# Patient Record
Sex: Male | Born: 1970 | Race: White | Hispanic: No | Marital: Single | Smoking: Current every day smoker
Health system: Southern US, Academic
[De-identification: ages and names within clinical notes are randomized; demographics above are authoritative.]

## PROBLEM LIST (undated history)

## (undated) ENCOUNTER — Encounter (HOSPITAL_COMMUNITY): Payer: Self-pay

## (undated) ENCOUNTER — Ambulatory Visit (HOSPITAL_COMMUNITY): Payer: Self-pay | Admitting: Gastroenterology

## (undated) ENCOUNTER — Ambulatory Visit (HOSPITAL_COMMUNITY): Payer: MEDICAID

## (undated) DIAGNOSIS — I509 Heart failure, unspecified: Secondary | ICD-10-CM

## (undated) DIAGNOSIS — E119 Type 2 diabetes mellitus without complications: Secondary | ICD-10-CM

## (undated) DIAGNOSIS — K5792 Diverticulitis of intestine, part unspecified, without perforation or abscess without bleeding: Secondary | ICD-10-CM

## (undated) DIAGNOSIS — M25559 Pain in unspecified hip: Secondary | ICD-10-CM

## (undated) DIAGNOSIS — M1990 Unspecified osteoarthritis, unspecified site: Secondary | ICD-10-CM

## (undated) DIAGNOSIS — Z720 Tobacco use: Secondary | ICD-10-CM

## (undated) DIAGNOSIS — I10 Essential (primary) hypertension: Secondary | ICD-10-CM

## (undated) DIAGNOSIS — K219 Gastro-esophageal reflux disease without esophagitis: Secondary | ICD-10-CM

## (undated) DIAGNOSIS — Z9989 Dependence on other enabling machines and devices: Secondary | ICD-10-CM

## (undated) DIAGNOSIS — K5730 Diverticulosis of large intestine without perforation or abscess without bleeding: Secondary | ICD-10-CM

## (undated) DIAGNOSIS — R7989 Other specified abnormal findings of blood chemistry: Secondary | ICD-10-CM

## (undated) DIAGNOSIS — F1320 Sedative, hypnotic or anxiolytic dependence, uncomplicated: Secondary | ICD-10-CM

## (undated) DIAGNOSIS — R569 Unspecified convulsions: Secondary | ICD-10-CM

## (undated) DIAGNOSIS — F419 Anxiety disorder, unspecified: Secondary | ICD-10-CM

## (undated) DIAGNOSIS — F32A Depression, unspecified: Secondary | ICD-10-CM

## (undated) DIAGNOSIS — G4730 Sleep apnea, unspecified: Secondary | ICD-10-CM

## (undated) DIAGNOSIS — F1520 Other stimulant dependence, uncomplicated: Secondary | ICD-10-CM

## (undated) DIAGNOSIS — F1020 Alcohol dependence, uncomplicated: Secondary | ICD-10-CM

## (undated) HISTORY — DX: Type 2 diabetes mellitus without complications: E11.9

## (undated) HISTORY — DX: Heart failure, unspecified: I50.9

## (undated) HISTORY — PX: COLECTOMY: SHX59

## (undated) HISTORY — DX: Unspecified osteoarthritis, unspecified site: M19.90

## (undated) HISTORY — DX: Essential (primary) hypertension: I10

## (undated) HISTORY — DX: Tobacco use: Z72.0

## (undated) HISTORY — DX: Other specified abnormal findings of blood chemistry: R79.89

## (undated) HISTORY — PX: COLECTOMY PARTIAL / TOTAL: SUR275

## (undated) HISTORY — DX: Diverticulosis of large intestine without perforation or abscess without bleeding: K57.30

## (undated) HISTORY — DX: Pain in unspecified hip: M25.559

## (undated) SURGERY — IR ARTHROGRAM - HIP LEFT
Laterality: Left

## (undated) SURGERY — COLONOSCOPY
Anesthesia: Monitor Anesthesia Care | Site: Anus

---
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
## 2006-03-12 ENCOUNTER — Encounter (INDEPENDENT_AMBULATORY_CARE_PROVIDER_SITE_OTHER): Payer: Self-pay | Admitting: *Deleted

## 2006-03-12 ENCOUNTER — Inpatient Hospital Stay (HOSPITAL_COMMUNITY): Admission: EM | Admit: 2006-03-12 | Discharge: 2006-03-26 | Payer: Self-pay | Admitting: *Deleted

## 2006-06-05 ENCOUNTER — Ambulatory Visit (HOSPITAL_COMMUNITY): Payer: Self-pay | Admitting: *Deleted

## 2006-06-27 ENCOUNTER — Encounter: Admission: RE | Admit: 2006-06-27 | Discharge: 2006-06-27 | Payer: Self-pay | Admitting: General Surgery

## 2006-07-28 ENCOUNTER — Inpatient Hospital Stay (HOSPITAL_COMMUNITY): Admission: RE | Admit: 2006-07-28 | Discharge: 2006-08-03 | Payer: Self-pay | Admitting: General Surgery

## 2006-07-28 ENCOUNTER — Encounter (INDEPENDENT_AMBULATORY_CARE_PROVIDER_SITE_OTHER): Payer: Self-pay | Admitting: Specialist

## 2007-01-13 ENCOUNTER — Ambulatory Visit (HOSPITAL_COMMUNITY): Payer: Self-pay | Admitting: *Deleted

## 2007-04-25 ENCOUNTER — Inpatient Hospital Stay (HOSPITAL_COMMUNITY): Admission: RE | Admit: 2007-04-25 | Discharge: 2007-04-29 | Payer: Self-pay | Admitting: General Surgery

## 2009-10-18 ENCOUNTER — Encounter (INDEPENDENT_AMBULATORY_CARE_PROVIDER_SITE_OTHER): Payer: Self-pay | Admitting: Family Medicine

## 2009-10-18 NOTE — Progress Notes (Signed)
error 

## 2011-03-19 NOTE — Op Note (Signed)
[name], [name]               ACCOUNT NO.:  [id number]   MEDICAL RECORD NO.:  [id number]          PATIENT TYPE:  OIB   LOCATION:  2550                         FACILITY:  [facility]   PHYSICIAN:  Ollen Gross. Vernell Morgans, M.D. DATE OF BIRTH:  1970-12-15   DATE OF PROCEDURE:  04/24/2007  DATE OF DISCHARGE:                               OPERATIVE REPORT   PREOPERATIVE DIAGNOSIS:  Ventral incisional hernia.   POSTOPERATIVE DIAGNOSIS:  Ventral incisional hernia.   PROCEDURE:  Laparoscopic ventral hernia repair with mesh and lysis of  adhesions.   SURGEON:  Ollen Gross. Vernell Morgans, M.D.   ASSISTANT:  Rose Phi. Maple Hudson, M.D.   ANESTHESIA:  General endotracheal.   PROCEDURE:  After informed consent was obtained, the patient was brought  to the operating room and placed in the supine position on the operating  room table.  After adequate induction of general anesthesia, the  patient's abdomen was prepped with Betadine and draped in the usual  sterile manner including an Ioban drape.  A site was then chosen in the  right upper quadrant for the Hasson port to access the abdominal cavity.  This was the area farthest away from his previous surgery sites.  The  area in the right upper quadrant was infiltrated with 0.25% Marcaine  with epinephrine.  A small incision was made with a 15 blade knife.  This incision was carried down through the subcutaneous tissue bluntly  with a Kelly clamp and [employer] retractors until the fascia of the  abdominal wall was encountered.  The fascia of the abdominal wall was  incised with a 15 blade knife.  The muscle layers were separated bluntly  with a Kelly clamp and [employer] retractors.  The posterior fascia was  then opened sharply with a 15 blade knife and each side was grasped with  Kocher clamps.  The peritoneum was identified and was grasped with Kelly  clamps and opened with Metzenbaum scissors.  This gave [place] access to the  abdominal cavity.  A 0 Vicryl pursestring  stitch was placed in the  fascia around this opening.  A Hasson cannula was placed through this  opening and anchored in place with the previously-placed Vicryl  pursestring stitch.  The abdomen was then insufflated with carbon  dioxide without difficulty.  A laparoscope was inserted through the  Hasson cannula and the abdominal wall was inspected.  The patient had a  lot of adhesions to the abdominal wall along the uppermost portion of  the abdominal wall and the adhesions appeared to all be omentum.  Down  in the pelvis there was some small bowel adhesion to the abdominal wall  but it had a very filmy, thin connection that was easily taken down  sharply with laparoscopic scissors.  The fatty adhesions to the  abdominal wall were taken down by a combination of blunt dissection and  some sharp dissection with the harmonic scalpel.  Once this was  accomplished, we were also able to inspect the old ostomy site and this  too had a hernia with some omental adhesions and, again, these were  taken down by a combination of blunt dissection and some sharp  dissection with the harmonic scalpel.  Once this was accomplished, we at  this point had completely freed the anterior abdominal wall of  adhesions.  The patient had a small hernia at the upper edge of his old  midline incision.  He also had a hernia at his old ostomy site and  almost the full length of his old incision had basically become one  large hernia.  Now that the abdominal wall was free, we were able to  estimate the size of the potential mesh that would cover all of these  defects.  The size of mesh that was chosen was 26 x 33 cm of Proceed  mesh.  The outline of the hernia defects was made using a spinal needle  and marker on the abdominal wall.  The mesh was then cut to fit to  overlap the edges of the defects by at least 3 cm except down in the  pelvis, where we could not overlap the mesh as far because of the pubic  bone.  The  mesh was then oriented with letters in four quadrants.  Novofil #1 stitches were placed in eight equidistant areas around the  edge of the mesh.  The mesh was oriented with the blue side toward the  abdominal wall.  Once the Novofil stitches were in place, the mesh was  then rolled like a cigar, placed through the Hasson cannula into the  abdominal cavity.  To do the lysis of adhesions, another port had been  placed at the beginning of the case in the right lower quadrant.  This  was a 5 mm port.  The area was infiltrated 0.25% Marcaine and a small  stab incision was made with a 15 blade knife and a 5 mm port had been  placed through the abdominal wall under direct vision.  A site was then  chosen also for another 5 mm port in the left upper quadrant.  This area  was also infiltrated with 0.25% Marcaine.  A small stab incision was  made with a 15 blade knife and a 5 mm port was placed bluntly through  this incision into the abdominal cavity under direct vision.  The mesh  was then unrolled and reoriented.  Small stab incisions were made in  eight areas that corresponded to the Novofil stitches.  A suture passer  was then used to bring each of the tails of the appropriate Novofil  stitch through the stab incision of the abdominal wall.  Once all eight  Novofil stitches were brought through the abdominal wall, the mesh was  pulled up and nicely approximated the anterior abdominal wall.  Each of  the Novofil stitches was then tied down without difficulty and there was  still good approximation of the mesh to the anterior abdominal wall.  Using a Pro Tacker, tacks were placed around of the edge of the mesh in  two layers so that there were no gaps between the stitches.  Again, once  this was accomplished the mesh laid in good apposition to the abdominal  wall.  All of the incisions and stacks appeared to be hemostatic.  At  this point we were removed the ports under direct vision and all  found  to be hemostatic.  The gas was allowed to escape and we watched the mesh  continue to appose the abdominal wall nicely.  The Hasson cannula was  then removed  and the fascial defect at this site was closed with the  previously-placed Vicryl pursestring stitch.  All of the skin incisions  were then closed with staples and sterile dressings were applied along  with an abdominal binder and a Kerlix fluff to midline of the abdomen.  The patient tolerated the procedure well.  At the end of the case all  needle, sponge and instrument counts were correct.  The patient was then  awakened and taken to recovery in stable condition.      Ollen Gross. Vernell Morgans, M.D.  Electronically Signed     PST/MEDQ  D:  04/24/2007  T:  04/25/2007  Job:  [id number]

## 2011-03-22 NOTE — Op Note (Signed)
[name], [name]               ACCOUNT NO.:  [id number]   MEDICAL RECORD NO.:  [id number]          PATIENT TYPE:  INP   LOCATION:  5702                         FACILITY:  [facility]   PHYSICIAN:  Ollen Gross. Vernell Morgans, M.D. DATE OF BIRTH:  Mar 18, 1971   DATE OF PROCEDURE:  03/12/2006  DATE OF DISCHARGE:                                 OPERATIVE REPORT   PREOPERATIVE DIAGNOSIS:  Perforated sigmoid diverticulitis.   POSTOPERATIVE DIAGNOSIS:  Perforated sigmoid diverticulitis.   PROCEDURES:  1.  Exploratory laparotomy.  2.  Sigmoid colectomy.  3.  Descending end colostomy with Hartmann pouch.   SURGEON:  Ollen Gross. Carolynne Edouard, M.D.   ASSISTANT:  Cherylynn Ridges, M.D.   ANESTHESIA:  General endotracheal.   PROCEDURE:  After informed consent was obtained, the patient was brought to  the operating room and placed in the supine position on the operating room  table.  After adequate induction of general anesthesia, the patient's  abdomen was prepped with Betadine and draped in a usual sterile manner.  A  lower midline incision was made with 10 blade knife.  This incision was  carried down through the skin and subcutaneous tissue sharply with  electrocautery until the linea alba was identified.  The linea alba was  incised with electrocautery.  The preperitoneal space was probed bluntly  with a hemostat until the peritoneum was opened and access was gained in the  abdominal cavity.  The rest incision was then opened under direct vision.  A  Balfour retractor was deployed.  The patient had a lot of purulent material  within the abdomen.  This was cultured and aspirated.  Next, the abdomen was  explored and there was an obvious inflamed segment of sigmoid colon.  The  sigmoid colon and descending colon were mobilized by incising the  retroperitoneal attachment along the white line of Toldt.  A site was chosen  below the area of inflammation on the sigmoid colon where there were no  diverticula and the  bowel appeared to be healthy.  The mesentery at this  point was opened sharply with electrocautery.  A GIA-75 staple was placed  across the bowel at this point, clamped and fired thereby dividing the colon  between staple lines.  The mesentery to the involved sigmoid colon was then  taken down by serially clamping with Kelly clamps, dividing and ligating  with 2-0 silk ties the vessels within the mesentery.  This dissection was  carried from distal to proximal.  Once this was accomplished, a site of  normal-appearing colon proximal to the involved segment was also chosen.  The mesentery at this point was opened sharply with the electrocautery and a  GIA-75 stapler was placed across the bowel at this point, clamped and fired,  thereby dividing the bowel between staple lines.  The rest of the mesentery  up to this point was then dissected again with a Kelly clamps by clamping  the vessels, dividing and ligating with 2-0 silk ties.  Once this was  accomplished, the Z segment was removed and sent to pathology for further  evaluation.  All four quadrants of the abdomen were irrigated copious  amounts of saline until the effluent was clear and no more pus was  identified.  The distal Hartmann pouch was marked with 2-0 Prolene stitches  at the edge of staple line.  Next, a site was chosen for creation of the  descending colostomy.  This area on the skin was grasped with Kelly clamp  and circular piece of skin was excised sharply with 10 blade knife.  A core  of fatty tissue was also excised sharply with the electrocautery.  The  fascia of the anterior abdominal wall was then opened in a cruciate manner  through both the anterior and posterior layers until three fingers could  easily be inserted through the opening.  A Babcock was inserted through the  opening and used to grasp the staple line of the descending colon.  Care was  taken to make sure the colon was not twisted.  This was then brought  up  through the abdominal wall without much difficulty.  The fascia of the  anterior abdominal wall was then closed with two running #1 PDS sutures.  The subcutaneous tissue was irrigated with copious amounts of saline and  Betadine and the skin was closed with staples.  A sterile green towel was  then placed over the wound.  The ostomy was then opened by excising the  staple line with the electrocautery and the ostomy was then matured to the  skin with interrupted 3-0 Vicryl stitches.  An ostomy appliance was then  applied.  The patient tolerated the procedure well.  At the end of the case,  all sponge, needle and instrument counts were correct.  The patient was then  awakened and taken to the recovery room in stable condition.      Ollen Gross. Vernell Morgans, M.D.  Electronically Signed     PST/MEDQ  D:  03/16/2006  T:  03/17/2006  Job:  [id number]

## 2011-03-22 NOTE — Op Note (Signed)
NAMEDILLIN, LOFGREN NO.:  [id number]   MEDICAL RECORD NO.:  [id number]          PATIENT TYPE:  INP   LOCATION:  5707                         FACILITY:  [facility]   PHYSICIAN:  Ollen Gross. Vernell Morgans, M.D. DATE OF BIRTH:  13-Feb-1971   DATE OF PROCEDURE:  07/28/2006  DATE OF DISCHARGE:  08/03/2006                                 OPERATIVE REPORT   PREOPERATIVE DIAGNOSIS:  Previous diverticulitis with perforation and  colostomy.   POSTOPERATIVE DIAGNOSIS:  Previous diverticulitis with perforation and  colostomy.   PROCEDURE:  Colostomy closure.   SURGEON:  Dr. Carolynne Edouard.   ASSISTANT:  Dr. Luisa Hart.   ANESTHESIA:  General endotracheal.   PROCEDURE:  After informed consent was obtained, the patient was brought to  the operating room and placed in supine position on the operating table.  After adequate induction of general anesthesia, the patient's abdomen was  prepped with Betadine and draped in usual sterile manner.  A lower midline  incision was made with a 10 blade knife.  The old scar was ellipsed out  sharply with the 10 blade knife and the electrocautery.  This dissection was  then carried through the subcutaneous tissue sharply with electrocautery  until the linea alba was identified.  Linea alba was incised with the  electrocautery.  The preperitoneal space was then probed bluntly with a  hemostat until the peritoneum was identified, and the peritoneum was opened  with Metzenbaum scissors, and the rest of the incision was opened under  direct vision.  There were some small bowel adhesions which were taken down  by some blunt finger dissection and some sharp dissection with the  Metzenbaum scissors.  Once this was accomplished, the small bowel was  completely free and packed into the upper abdomen.  The pelvis was  inspected, and the rectosigmoid stump was identified.  It was very mobile  and clean.  The patient had previously undergone a barium enema through the  rectum and the ostomy, and no other diverticular disease was identified.  At  this point, the ostomy was mobilized up through the abdominal wall by  combination of blunt finger dissection and some sharp dissection with the  electrocautery.  Once this dissection was carried up almost to the skin,  this ostomy at the level of skin was then excised in an elliptical fashion.  The dissection was carried into the subcutaneous tissue sharply with the  electrocautery, until the dissection met the dissection from below.  Once  this was accomplished, the ostomy was brought into the abdomen.  The distal  several centimeters of colon was then excised sharply with an Allen clamp  and Kocher clamp, and the mesentery to this section was taken down by  serially clamping the vessels with hemostats and dividing and ligating them  with 2-0 silk ties.  The staple line on the distal colon segment was also  grasped with Satinsky clamp and excised sharply with a 10 blade knife.  The  colon edges both proximally and distally were very healthy with good blood  supply.  An anastomosis was then created  using interrupted 3-0 silk full-  thickness inner stitches.  The posterior wall was created first, followed by  the anterior wall, keeping the knots buried on the inside of the anastomosis  except for the last several stitches which were done in a simple manner with  the knots on the outside.  Once this was accomplished, the anastomosis was  reinforced in places with several interrupted 3-0 silk stitches until the  anastomosis looked good and healthy and watertight.  There was no tension at  all once this was accomplished.  The abdomen was then irrigated with copious  amounts of saline.  All of the packs were removed.  The fascia of the  anterior abdominal wall was then closed with two running #1 PDS sutures as  well as with multiple interrupted #1 Novofils as sort of internal retention  sutures.  The subcutaneous  tissue was then irrigated with copious amounts of  saline and Betadine.  Some subcutaneous skin flaps were created in order to  get the fascia closed cleanly.  The skin was then closed with a combination  of staples and interrupted 3-0 nylon vertical mattress stitches to take some  of the tension off the skin.  The old ostomy defect was closed with a deep  layer on the inside of #1 Novofils and a more shallow layer of the anterior  abdominal wall fascia of interrupted #1 Novofils.  The wound was irrigated  with copious amounts of saline and Betadine, and that skin was closed with  staples.  Sterile dressings were then applied.  The patient tolerated the  procedure well.  At the case, all needle, sponge and instrument counts were  correct.  The patient was then awakened and taken to the recovery in stable  condition.      Ollen Gross. Vernell Morgans, M.D.  Electronically Signed     PST/MEDQ  D:  08/04/2006  T:  08/04/2006  Job:  [id number]

## 2011-03-22 NOTE — Discharge Summary (Signed)
NAMESALAHUDDIN, ARISMENDEZ NO.:  [id number]   MEDICAL RECORD NO.:  [id number]          PATIENT TYPE:  INP   LOCATION:  5707                         FACILITY:  [facility]   PHYSICIAN:  Ollen Gross. Vernell Morgans, M.D. DATE OF BIRTH:  10/22/1971   DATE OF ADMISSION:  07/28/2006  DATE OF DISCHARGE:  08/03/2006                               DISCHARGE SUMMARY   Mr. Proia is a 40 year old white male who was brought to the operating  room on September 24 for a colostomy taken down.  He previously had a  colostomy for perforated sigmoid diverticulitis.  His operation went  well.  On postop day 1, he had some signs of some hypovolemia, which was  corrected with volume.  He continued to have an NG and bowel rest.  On  postop day 3, his NG tube was discontinued, as well as his Foley.  He  was given just ice chips.  His bowel function gradually improved and his  diet was slowly advanced.  He was noted to have a little bit of redness  at the lower portion of his incision, but this remained stable and  improved by the time of discharge and by 9:30 he was tolerating a diet.  His pain was under good control and he was ready for discharge home.   MEDICATIONS:  He was written for some pain medicine and he was given  pain medicine for discharge.  Percocet.   DIET:  As tolerated.   ACTIVITY:  No heavy lifting.   CONDITION:  Stable.   FOLLOWUP:  Appointment with Dr. Carolynne Edouard in 2 weeks.      Ollen Gross. Vernell Morgans, M.D.  Electronically Signed     PST/MEDQ  D:  10/29/2006  T:  10/30/2006  Job:  [id number]

## 2011-03-22 NOTE — Discharge Summary (Signed)
[name], [name]               ACCOUNT NO.:  [id number]   MEDICAL RECORD NO.:  [id number]          PATIENT TYPE:  INP   LOCATION:  5702                         FACILITY:  [facility]   PHYSICIAN:  Revonda Standard L. Rennis Harding, N.P. DATE OF BIRTH:  Jun 20, 1971   DATE OF ADMISSION:  03/12/2006  DATE OF DISCHARGE:  03/26/2006                                 DISCHARGE SUMMARY   DISCHARGING PHYSICIAN:  Dr. Sheryle Spray.   CHIEF COMPLAINT/REASON FOR ADMISSION:  [name] is a 40 year old male  patient who presented to the ER with a two-day history of significant right  lower quadrant pain.  Initially he was felt to have an acute appendicitis  and a possible abscess formation.  He had a white count of 13,400 and a  temperature of 101.1.  Subsequent CT scan reviewed by Dr. Carolynne Edouard and the  radiologist revealed that the patient actually had acute diverticulitis with  free-air perforation and abscess formation.  Because of these findings, the  patient was taken urgently to the OR for surgical repair.   On the day of admission, the patient was taken to the OR where he underwent  exploratory laparotomy for perforated sigmoid diverticulitis.  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  The patient was subsequently admitted to the general surgical floor in  stable condition.   ADMITTING DIAGNOSES:  1.  Abdominal pain, secondary to perforated sigmoid diverticulitis.  2.  Exploratory laparotomy with sigmoid colectomy and end colostomy.  3.  Leukocytosis.  4.  Fever.  5.  History of anxiety disorder.  6.  History of tobacco abuse.  7.  Daily alcohol use.   HOSPITAL COURSE:  The patient was admitted to the general medical floor in  stable condition.  He had been placed on Unasyn preoperatively and this was  continued postoperatively.  The patient continued with problems related to  mild leukocytosis and postoperative ileus.  The abdomen was tight, the stoma  was functioning,  having serosanguineous drainage.  The patient, within the  first few days of surgery, did lose adequate IV access and a PICC line was  placed.  Postop day 3, the patient continued with increasing leukocytosis  and fever, white count 15,900, temperature 101.3.  Abdomen remained  distended, and the incision had erythematous appearance with edema.  A  bedside I&D with culture was done per Dr. Lurene Shadow and wound packing was  initiated.  By postop day 4, the patient had diminished abdominal pain, his  white count had decreased to 12,900.  He was complaining of hunger and there  was flatus noted in the ostomy bag.  Clear liquid trial was initiated at  this point.  This was started initially with the NG tube clamped with plans  to discontinue the NG tube if the patient tolerated, and by postop day 5,  the NG tube was discontinued, and the patient was advanced to a full clear  liquid diet as opposed to sips of clears which were initiated 24 hours  prior.  He was continued on Unasyn and his white count remained steady.  By  postop day 7, the patient was noted to have increased serosanguineous and  creamy discharge from the lower portion of the open wound.  There was also  noted to be a significant area of tracking, so wound VAC was applied.  On  postop day 8, the patient's white count had bumped up to 13,100.  Previously, it had been 10,900.  He was not having any fevers.  Subsequent  wound culture that had been obtained earlier was positive for pansensitive  E. coli.  The abdomen was assessed closely because of increase in white  count.  The patient did have diminished bowel sounds, and there was a  significant amount of serosanguineous and cloudy drainage in the wound VAC  collection system.  The patient was also given brightly colored liquids to  drink because there some concern that there may be an entero-colonic  fistula, but this proved not to be the case.  There was no change in the  color of  the drainage.  The patient also, within the next 24 hours,  developed some bloody drainage out the back.  The wound VAC was placed n  hold and dry dressings were used for the next 48 hours, and the patient  otherwise did well without any recurrence of wound bleeding, and by postop  day 11, the wound was reevaluated and there was a nice red granular base  with expected bleeding with removal of the wound VAC, no further bloody  drainage into the [facility] system.  No signs of infection.  The abdomen was soft,  bowel sounds were present, and the patient, at this point, was tolerating a  regular diet without difficulty.  His white count was down to 10,500.  The  only issues at this point were adequate pain control.  The patient had been  placed on Dilaudid and this was not helping with pain control.  He was  subsequently switched to Percocet p.r.n. and ibuprofen every 8 hours  scheduled, with Protonix while on the ibuprofen and this helped with his  pain management.  By postop day 13, on Mar 26, 2006, the patient was deemed  appropriate for discharge home.  He was afebrile, his vital signs were  stable.  He was tolerating a regular diet.  The ostomy had formed stool, and  the wound VAC was in place with serous drainage noted.   FINAL DISCHARGE DIAGNOSES:  1.  Perforated diverticulitis with abscess formation, status post colectomy      and colostomy.  2.  Open wound, positive for pansensitive Escherichia coli with wound VAC      for management of wound.  3.  Incisional pain, improved.  4.  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  5.  Regular tobacco use.   DISCHARGE MEDICATIONS:  1.  Protonix 40 mg daily.  2.  Percocet 5/325 one to two tabs every 4-6 hours as needed for pain.  3.  Ibuprofen 600 mg 3 times daily for 10 days.  4.  Nicotine patch from the drug store.   DIET:  No restrictions.  ACTIVITY:  Increase activity slowly, no driving while  taking the Percocet,  no lifting for four weeks.  Return to work to be determined by Dr. Carolynne Edouard at  followup visit.   WOUND CARE:  [facility], [phone], will be following the patient at  home.  The patient will be discharged with the wound VAC in place with 125  cm of suction.  Planned dressing changes and canister changes Monday,  Wednesday, and Friday.   FOLLOWUP:  He needs to call Dr. Carolynne Edouard at [phone] to be seen in two weeks.   ADDITIONAL INSTRUCTIONS:  The patient has been instructed to not drink any  alcoholic beverages while taking the Percocet.      Allison L. Rennis Harding, N.P.     ALE/MEDQ  D:  03/26/2006  T:  03/26/2006  Job:  [id number]   cc:   Ollen Gross. Vernell Morgans, M.D.  [address]  [place]  [place] [address]

## 2011-03-22 NOTE — H&P (Signed)
[name], [name]               ACCOUNT NO.:  [id number]   MEDICAL RECORD NO.:  [id number]           PATIENT TYPE:   LOCATION:                                 FACILITY:   PHYSICIAN:  Ollen Gross. Carolynne Edouard, M.D.     DATE OF BIRTH:  21-Dec-1970   DATE OF ADMISSION:  03/12/2006  DATE OF DISCHARGE:                                HISTORY & PHYSICAL   PRIMARY CARE PHYSICIAN:  None.   CHIEF COMPLAINT:  Right lower quadrant abdominal pain, severe.   HISTORY OF PRESENT ILLNESS:  [name] is a 40 year old male patient with no  significant past medical history other than anxiety disorder and prior right  inguinal hernia repair.  Two days ago, he noticed a pinching in his right  lower quadrant.  This discomfort was not too severe.  He did not think much  about it. He thought maybe his clothing was too tight.  Twenty-four hours  prior to presentation, the patient developed significant increase in  severity of that pain, developed anorexia.  He has not had a bowel movement  for at least 2 days.  He has not had nausea and vomiting.  The pain would  increase with any sort of activity including walking or breathing.  The  patient had to sleep sitting up last night due to discomfort and pain.  He  has noticed also increase in abdominal distention.  Because of severity of  symptoms, he presented to the ER.  He has a temperature max of 101.1.  White  count was 13,400.  CT of the abdomen was done because of suspicion of acute  appendicitis and possible abscess formation.  CT has been reviewed per Dr.  Carolynne Edouard, myself, and the radiologist, and the patient actually has acute  diverticulitis with free air, perforation, and abscess formation.  Because  of these findings, the patient will be admitted and taken to the OR urgently  for surgical repair.   REVIEW OF SYSTEMS:  As per History of Present Illness.  He has not had any  diarrhea, no blood in his stools, no issues related to chronic abdominal  pain.   Otherwise Review of Systems is negative.   PAST MEDICAL HISTORY:  Anxiety disorder.   PAST SURGICAL HISTORY:  Repair of right inguinal hernia.   FAMILY MEDICAL HISTORY:  Noncontributory.   SOCIAL HISTORY:  He smokes one pack per day.  He drinks a few beers daily.  He is married and works as a Optometrist.   DRUG ALLERGIES:  CHROMIC SUTURE which causes rash and hives.   MEDICATIONS:  Xanax p.r.n.   PHYSICAL EXAMINATION:  GENERAL:  Pleasant but toxic-appearing male, quite  restless with abdominal pain.  VITAL SIGNS:  Temperature 101.1, blood pressure 123/71, pulse 123,  respirations 22.  HEENT:  Head is normocephalic.  Sclerae not injected.  NECK: Supple. No adenopathy.  CHEST: Bilateral lung sounds are clear to auscultation.  He is on room air,  and respiratory effort is unlabored.  CARDIAC: S1, S2.  No rubs, murmurs, thrills, or gallops.  ABDOMEN: Distended and tympanitic.  Umbilical  hernia, which is chronic, is  tender to palpation, and I am unable to reduce this area.  He has guarding  and rebounding, most notable in the right lower quadrant.  He does not have  any bowel sounds present.  The abdomen has a mottled appearance to it.  EXTREMITIES: Symmetrical in appearance without clubbing, cyanosis, or edema.   __________ 400, hemoglobin 15.2, platelets 244,000.  Sodium 144, potassium  3.9, CO2 26, glucose 121, BUN 7, creatinine 1.  LFTs normal.  Lipase 17.   CT again shows colonic perforated diverticulitis in the sigmoid colon with  probable abscess formation as well as significant inflammation of associated  mesentery and free air.   IMPRESSION:  1.  Perforated diverticulitis in colon with associated abscess formation.  2.  Leukocytosis.  3.  Volume depletion.   PLAN:  1.  Admit.  2.  N.P.O. status.  3.  IV fluid hydration.  4.  IV Unasyn.  5.  OR urgently.  Reasons for surgery including need for colostomy due to      the degree of suspected inflammation of the bowel of  said procedure.      Patient agrees and will proceed with urgent surgery.  Also, Dr. Carolynne Edouard has      called the patient's mother, Dr. Samuel Bouche, who is a pediatrician, to speak      with her to discuss the diagnosis.      Allison L. Rennis Harding, N.P.      ______________________________  Ollen Gross. Carolynne Edouard, M.D.    ALE/MEDQ  D:  03/12/2006  T:  03/12/2006  Job:  [id number]

## 2011-03-22 NOTE — Discharge Summary (Signed)
NAMEMACY, LINGENFELTER NO.:  [id number]   MEDICAL RECORD NO.:  [id number]          PATIENT TYPE:  INP   LOCATION:  5729                         FACILITY:  [facility]   PHYSICIAN:  Ollen Gross. Vernell Morgans, M.D. DATE OF BIRTH:  [birth year]   DATE OF ADMISSION:  04/24/2007  DATE OF DISCHARGE:  04/29/2007                               DISCHARGE SUMMARY   Mr. Koman is a 40 year old white male who had a previous a sigmoid  colectomy and colostomy and then a subsequent colostomy takedown.  He  had a ventral hernia develop and was brought to the operating room on  April 24, 2007, for a laparoscopic repair of his ventral hernia.  He  tolerated the surgery well.   Postoperatively, his main issue was pain control.  He was kept on a  morphine PCA for a couple of days.  Toradol was added to help with his  pain control.  He also had somewhat of an ileus after surgery which  gradually resolved and by April 29, 2007, his pain was under reasonable  control.  He was tolerating a diet and at that point, he was ready for  discharge home.   MEDICATIONS AT THE TIME OF DISCHARGE:  He was to resume his home meds  and he was given a prescription for Percocet for pain.   ACTIVITIES:  No heavy lifting.   CONDITION:  Stable.   DIET:  As tolerated.   FINAL DIAGNOSIS:  Ventral hernia.   Followup will be with Dr. Carolynne Edouard in 2 weeks.  The patient is discharged  home.      Ollen Gross. Vernell Morgans, M.D.  Electronically Signed     PST/MEDQ  D:  06/18/2007  T:  06/19/2007  Job:  [id number]

## 2011-08-21 LAB — BASIC METABOLIC PANEL
BUN: 11
BUN: 3 — ABNORMAL LOW
CO2: 23
CO2: 27
CO2: 33 — ABNORMAL HIGH
Calcium: 8.8
Chloride: 99
Creatinine, Ser: 0.77
Creatinine, Ser: 1.12
GFR calc Af Amer: >60
GFR calc non Af Amer: >60
GFR calc non Af Amer: >60
Glucose, Bld: 139 — ABNORMAL HIGH
Potassium: 4.2
Sodium: 140

## 2011-08-21 LAB — DIFFERENTIAL
Basophils Absolute: 0
Basophils Absolute: 0
Basophils Relative: 0
Eosinophils Absolute: 0.1
Eosinophils Relative: 2
Eosinophils Relative: 5
Lymphocytes Relative: 19
Lymphs Abs: 2.6
Lymphs Abs: 2.7
Monocytes Absolute: 0.6
Monocytes Absolute: 1.3 — ABNORMAL HIGH
Monocytes Relative: 10
Monocytes Relative: 6
Monocytes Relative: 9
Neutrophils Relative %: 60

## 2011-08-21 LAB — CBC
Hemoglobin: 13.7
Hemoglobin: 14.6
Hemoglobin: 15.9
MCHC: 34.2
MCV: 89.9
MCV: 91.3
Platelets: 241
RBC: 4.4
RDW: 12.5
WBC: 12.2 — ABNORMAL HIGH
WBC: 13.8 — ABNORMAL HIGH
WBC: 9.5

## 2018-04-01 ENCOUNTER — Inpatient Hospital Stay (HOSPITAL_COMMUNITY)
Admission: EM | Admit: 2018-04-01 | Discharge: 2018-04-08 | DRG: 287 | Disposition: A | Discharge status: Home / Self Care | Payer: Self-pay | Attending: Family Medicine | Admitting: Family Medicine

## 2018-04-01 ENCOUNTER — Other Ambulatory Visit: Payer: Self-pay

## 2018-04-01 ENCOUNTER — Emergency Department (HOSPITAL_COMMUNITY): Payer: Self-pay

## 2018-04-01 ENCOUNTER — Encounter (HOSPITAL_COMMUNITY): Payer: Self-pay | Admitting: Emergency Medicine

## 2018-04-01 DIAGNOSIS — I2722 Pulmonary hypertension due to left heart disease: Secondary | ICD-10-CM | POA: Diagnosis present

## 2018-04-01 DIAGNOSIS — R52 Pain, unspecified: Secondary | ICD-10-CM

## 2018-04-01 DIAGNOSIS — M109 Gout, unspecified: Secondary | ICD-10-CM | POA: Diagnosis present

## 2018-04-01 DIAGNOSIS — R030 Elevated blood-pressure reading, without diagnosis of hypertension: Secondary | ICD-10-CM | POA: Diagnosis present

## 2018-04-01 DIAGNOSIS — I110 Hypertensive heart disease with heart failure: Principal | ICD-10-CM | POA: Diagnosis present

## 2018-04-01 DIAGNOSIS — J810 Acute pulmonary edema: Secondary | ICD-10-CM

## 2018-04-01 DIAGNOSIS — I5041 Acute combined systolic (congestive) and diastolic (congestive) heart failure: Secondary | ICD-10-CM | POA: Diagnosis present

## 2018-04-01 DIAGNOSIS — Z9049 Acquired absence of other specified parts of digestive tract: Secondary | ICD-10-CM

## 2018-04-01 DIAGNOSIS — F17210 Nicotine dependence, cigarettes, uncomplicated: Secondary | ICD-10-CM | POA: Diagnosis present

## 2018-04-01 DIAGNOSIS — Z6841 Body Mass Index (BMI) 40.0 and over, adult: Secondary | ICD-10-CM

## 2018-04-01 DIAGNOSIS — E119 Type 2 diabetes mellitus without complications: Secondary | ICD-10-CM

## 2018-04-01 DIAGNOSIS — F10239 Alcohol dependence with withdrawal, unspecified: Secondary | ICD-10-CM | POA: Diagnosis present

## 2018-04-01 DIAGNOSIS — I071 Rheumatic tricuspid insufficiency: Secondary | ICD-10-CM | POA: Diagnosis present

## 2018-04-01 DIAGNOSIS — I5082 Biventricular heart failure: Secondary | ICD-10-CM | POA: Diagnosis present

## 2018-04-01 DIAGNOSIS — F1210 Cannabis abuse, uncomplicated: Secondary | ICD-10-CM | POA: Diagnosis present

## 2018-04-01 DIAGNOSIS — E1165 Type 2 diabetes mellitus with hyperglycemia: Secondary | ICD-10-CM | POA: Diagnosis present

## 2018-04-01 DIAGNOSIS — F1910 Other psychoactive substance abuse, uncomplicated: Secondary | ICD-10-CM | POA: Diagnosis present

## 2018-04-01 DIAGNOSIS — G4733 Obstructive sleep apnea (adult) (pediatric): Secondary | ICD-10-CM | POA: Diagnosis present

## 2018-04-01 DIAGNOSIS — I420 Dilated cardiomyopathy: Secondary | ICD-10-CM | POA: Diagnosis present

## 2018-04-01 DIAGNOSIS — F1410 Cocaine abuse, uncomplicated: Secondary | ICD-10-CM | POA: Diagnosis present

## 2018-04-01 DIAGNOSIS — I509 Heart failure, unspecified: Secondary | ICD-10-CM

## 2018-04-01 DIAGNOSIS — F17200 Nicotine dependence, unspecified, uncomplicated: Secondary | ICD-10-CM

## 2018-04-01 DIAGNOSIS — R739 Hyperglycemia, unspecified: Secondary | ICD-10-CM | POA: Diagnosis present

## 2018-04-01 DIAGNOSIS — I2510 Atherosclerotic heart disease of native coronary artery without angina pectoris: Secondary | ICD-10-CM | POA: Diagnosis present

## 2018-04-01 DIAGNOSIS — I472 Ventricular tachycardia: Secondary | ICD-10-CM | POA: Diagnosis not present

## 2018-04-01 HISTORY — DX: Diverticulitis of intestine, part unspecified, without perforation or abscess without bleeding: K57.92

## 2018-04-01 LAB — BASIC METABOLIC PANEL
ANION GAP: 8 (ref 5–15)
BUN: 10 mg/dL (ref 6–20)
CALCIUM: 8.5 mg/dL — AB (ref 8.9–10.3)
CO2: 24 mmol/L (ref 22–32)
Chloride: 102 mmol/L (ref 101–111)
Creatinine, Ser: 0.67 mg/dL (ref 0.61–1.24)
GLUCOSE: 154 mg/dL — AB (ref 65–99)
POTASSIUM: 4 mmol/L (ref 3.5–5.1)
SODIUM: 134 mmol/L — AB (ref 135–145)

## 2018-04-01 LAB — TSH: TSH: 1.999 u[IU]/mL (ref 0.350–4.500)

## 2018-04-01 LAB — I-STAT TROPONIN, ED: Troponin i, poc: 0.02 ng/mL (ref 0.00–0.08)

## 2018-04-01 LAB — CBC
HCT: 45.9 % (ref 39.0–52.0)
Hemoglobin: 14.2 g/dL (ref 13.0–17.0)
MCH: 28.1 pg (ref 26.0–34.0)
MCHC: 30.9 g/dL (ref 30.0–36.0)
MCV: 90.7 fL (ref 78.0–100.0)
PLATELETS: 231 10*3/uL (ref 150–400)
RBC: 5.06 MIL/uL (ref 4.22–5.81)
RDW: 14.5 % (ref 11.5–15.5)
WBC: 7.8 10*3/uL (ref 4.0–10.5)

## 2018-04-01 LAB — GLUCOSE, CAPILLARY: Glucose-Capillary: 171 mg/dL — ABNORMAL HIGH (ref 65–99)

## 2018-04-01 LAB — CBG MONITORING, ED
GLUCOSE-CAPILLARY: 115 mg/dL — AB (ref 65–99)
GLUCOSE-CAPILLARY: 143 mg/dL — AB (ref 65–99)

## 2018-04-01 LAB — HEMOGLOBIN A1C
HEMOGLOBIN A1C: 6.9 % — AB (ref 4.8–5.6)
Mean Plasma Glucose: 151.33 mg/dL

## 2018-04-01 LAB — MAGNESIUM: MAGNESIUM: 1.3 mg/dL — AB (ref 1.7–2.4)

## 2018-04-01 LAB — BRAIN NATRIURETIC PEPTIDE: B NATRIURETIC PEPTIDE 5: 814.4 pg/mL — AB (ref 0.0–100.0)

## 2018-04-01 MED ORDER — ORAL CARE MOUTH RINSE: 15.0000 mL | Freq: Two times a day (BID) | OROMUCOSAL | Status: DC

## 2018-04-01 MED ORDER — ENOXAPARIN SODIUM 40 MG/0.4ML ~~LOC~~ SOLN
40.0000 mg | SUBCUTANEOUS | Status: DC
  Administered 2018-04-01 – 2018-04-06 (×6): 40 mg via SUBCUTANEOUS
  Filled 2018-04-01 (×7): qty 0.4

## 2018-04-01 MED ORDER — ASPIRIN EC 81 MG PO TBEC
81.0000 mg | DELAYED_RELEASE_TABLET | Freq: Every day | ORAL | Status: DC
  Administered 2018-04-01 – 2018-04-08 (×7): 81 mg via ORAL
  Filled 2018-04-01 (×8): qty 1

## 2018-04-01 MED ORDER — LISINOPRIL 5 MG PO TABS
5.0000 mg | ORAL_TABLET | Freq: Every day | ORAL | Status: DC
  Administered 2018-04-01 – 2018-04-02 (×2): 5 mg via ORAL
  Filled 2018-04-01: qty 2
  Filled 2018-04-01: qty 1

## 2018-04-01 MED ORDER — ALPRAZOLAM 0.5 MG PO TABS: 0.5000 mg | ORAL_TABLET | Freq: Three times a day (TID) | ORAL | Status: DC | PRN

## 2018-04-01 MED ORDER — ONDANSETRON HCL 4 MG/2ML IJ SOLN: 4.0000 mg | Freq: Four times a day (QID) | INTRAMUSCULAR | Status: DC | PRN

## 2018-04-01 MED ORDER — ALPRAZOLAM 0.5 MG PO TABS
2.0000 mg | ORAL_TABLET | Freq: Two times a day (BID) | ORAL | Status: DC
  Administered 2018-04-01: 2 mg via ORAL
  Filled 2018-04-01: qty 8

## 2018-04-01 MED ORDER — ORAL CARE MOUTH RINSE
15.0000 mL | Freq: Two times a day (BID) | OROMUCOSAL | Status: DC
  Administered 2018-04-02 – 2018-04-07 (×9): 15 mL via OROMUCOSAL

## 2018-04-01 MED ORDER — ALBUTEROL SULFATE (2.5 MG/3ML) 0.083% IN NEBU
5.0000 mg | INHALATION_SOLUTION | Freq: Once | RESPIRATORY_TRACT | Status: AC
  Administered 2018-04-01: 5 mg via RESPIRATORY_TRACT
  Filled 2018-04-01: qty 6

## 2018-04-01 MED ORDER — INSULIN ASPART 100 UNIT/ML ~~LOC~~ SOLN
0.0000 [IU] | Freq: Every day | SUBCUTANEOUS | Status: DC
  Administered 2018-04-07: 2 [IU] via SUBCUTANEOUS

## 2018-04-01 MED ORDER — SODIUM CHLORIDE 0.9 % IV SOLN: 250.0000 mL | INTRAVENOUS | Status: DC | PRN

## 2018-04-01 MED ORDER — SODIUM CHLORIDE 0.9% FLUSH
3.0000 mL | Freq: Two times a day (BID) | INTRAVENOUS | Status: DC
  Administered 2018-04-01 – 2018-04-06 (×12): 3 mL via INTRAVENOUS

## 2018-04-01 MED ORDER — FUROSEMIDE 10 MG/ML IJ SOLN
80.0000 mg | Freq: Once | INTRAMUSCULAR | Status: AC
  Administered 2018-04-01: 80 mg via INTRAVENOUS
  Filled 2018-04-01: qty 8

## 2018-04-01 MED ORDER — INSULIN ASPART 100 UNIT/ML ~~LOC~~ SOLN
0.0000 [IU] | Freq: Three times a day (TID) | SUBCUTANEOUS | Status: DC
  Administered 2018-04-01 – 2018-04-02 (×2): 2 [IU] via SUBCUTANEOUS
  Administered 2018-04-02 (×2): 3 [IU] via SUBCUTANEOUS
  Administered 2018-04-03 (×3): 2 [IU] via SUBCUTANEOUS
  Administered 2018-04-04 – 2018-04-05 (×4): 3 [IU] via SUBCUTANEOUS
  Administered 2018-04-06 (×2): 2 [IU] via SUBCUTANEOUS
  Administered 2018-04-06: 3 [IU] via SUBCUTANEOUS
  Administered 2018-04-07 – 2018-04-08 (×2): 5 [IU] via SUBCUTANEOUS

## 2018-04-01 MED ORDER — NICOTINE 14 MG/24HR TD PT24
14.0000 mg | MEDICATED_PATCH | Freq: Every day | TRANSDERMAL | Status: DC
  Administered 2018-04-01 – 2018-04-06 (×6): 14 mg via TRANSDERMAL
  Filled 2018-04-01 (×6): qty 1

## 2018-04-01 MED ORDER — FUROSEMIDE 10 MG/ML IJ SOLN
40.0000 mg | Freq: Two times a day (BID) | INTRAMUSCULAR | Status: DC
  Administered 2018-04-01 – 2018-04-06 (×11): 40 mg via INTRAVENOUS
  Filled 2018-04-01 (×11): qty 4

## 2018-04-01 MED ORDER — ACETAMINOPHEN 325 MG PO TABS
650.0000 mg | ORAL_TABLET | ORAL | Status: DC | PRN
  Administered 2018-04-01 – 2018-04-08 (×7): 650 mg via ORAL
  Filled 2018-04-01 (×7): qty 2

## 2018-04-01 MED ORDER — POTASSIUM CHLORIDE CRYS ER 20 MEQ PO TBCR
20.0000 meq | EXTENDED_RELEASE_TABLET | Freq: Once | ORAL | Status: AC
  Administered 2018-04-01: 20 meq via ORAL
  Filled 2018-04-01: qty 1

## 2018-04-01 MED ORDER — SODIUM CHLORIDE 0.9% FLUSH: 3.0000 mL | INTRAVENOUS | Status: DC | PRN

## 2018-04-01 NOTE — ED Triage Notes (Signed)
Pt states he has had cough and illness for a few days but increasing shob today.  Hurts to lay down and makes him more short of breath.  Cough present.

## 2018-04-01 NOTE — ED Notes (Signed)
Pt desating- 5 L  applied. No improvement; nonrebreather applied. Dr. Ophelia Charter paged. Pt now more arousable and sats improving.

## 2018-04-01 NOTE — H&P (Signed)
History and Physical    [name] XBM:[id number] DOB: 1971-02-25 DOA: 04/01/2018  PCP: Patient, No Pcp Per Consultants:  [name] - psychiatry Patient coming from:  Home - lives with [name]; [name], [phone]  Chief Complaint: SOB  HPI: [name] is a 47 y.o. male with no significant past medical history presenting with SOB.  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  He has noticed SOB.  It kept getting progressively worse.  He was struggling for air and decided to come to the ER.  Symptoms started about 2 weeks ago.  He has had progressive edema of his feet and legs as well as his abdomen for that same period of time.  +orthopnea.  +PND.  +cough productive of "darker, like brown" sputum.  Denies chest pain.  His right knee has been hurting with swelling, hurts to walk.  ED Course:  New CHF.  Edema to proximal thighs. Chest pressure with orthopnea.  Symptoms for a few weeks.  Tachypnea and mild tachycardia.  Pleural effusion on right, interstitial edema.  Elevated BNP.  Negative troponin.  Review of Systems: As per HPI; otherwise review of systems reviewed and negative.   Ambulatory Status:  Ambulates without assistance  Past Medical History:  Diagnosis Date  . Diverticulitis     Past Surgical History:  Procedure Laterality Date  . COLECTOMY     for diverticulitis    Social History   Socioeconomic History  . Marital status: Single    Spouse name: Not on file  . Number of children: Not on file  . Years of education: Not on file  . Highest education level: Not on file  Occupational History  . Not on file  Social Needs  . Financial resource strain: Not on file  . Food insecurity:    Worry: Not on file    Inability: Not on file  . Transportation needs:    Medical: Not on file    Non-medical: Not on file  Tobacco Use  . Smoking status: Current Every Day Smoker    Packs/day: 0.75    Years: 32.00    Pack  years: 24.00  . Smokeless tobacco: Never Used  Substance and Sexual Activity  . Alcohol use: Yes    Comment: most days, a few shots and a beer  . Drug use: Yes    Types: Marijuana    Comment: marijuana 1-2 times per week; reluctantly admits to cocaine with last use maybe 1 month ago  . Sexual activity: Not on file  Lifestyle  . Physical activity:    Days per week: Not on file    Minutes per session: Not on file  . Stress: Not on file  Relationships  . Social connections:    Talks on phone: Not on file    Gets together: Not on file    Attends religious service: Not on file    Active member of club or organization: Not on file    Attends meetings of clubs or organizations: Not on file    Relationship status: Not on file  . Intimate partner violence:    Fear of current or ex partner: Not on file    Emotionally abused: Not on file    Physically abused: Not on file    Forced sexual activity: Not on file  Other Topics Concern  . Not on file  Social History Narrative  . Not on file  No Known Allergies  Family History  Problem Relation Age of Onset  . Congestive Heart Failure Neg Hx     Prior to Admission medications   Medication Sig Start Date End Date Taking? Authorizing Provider  alprazolam Prudy Feeler) 2 MG tablet Take 2 mg by mouth 2 (two) times daily. 03/02/18  Yes [provider]  methylphenidate (RITALIN) 20 MG tablet Take 20 mg by mouth 4 (four) times daily. 03/08/18  Yes [provider]    Physical Exam: Vitals:   04/01/18 0825 04/01/18 1124 04/01/18 1130 04/01/18 1131  BP: (!) 143/114 (!) 126/110 (!) 159/119 (!) 140/104  Pulse: (!) 110 (!) 108 (!) 110 (!) 102  Resp: (!) 22     Temp: 97.7 F (36.5 C)     TempSrc: Oral     SpO2: 96% 94% (!) 88% 92%     General:  Appears calm and comfortable and is NAD; somewhat somnolent Eyes:  PERRL, EOMI, normal lids, iris ENT:  grossly normal hearing, lips & tongue, mmm; poor dentition Neck:  no LAD,  masses or thyromegaly; no carotid bruits Cardiovascular:  RRR, no m/r/g.  Respiratory:   CTA bilaterally with no wheezes/rales/rhonchi.  Mildly increased respiratory effort. Abdomen:  soft, NT, ND, NABS; he has taut edema of the abdominal wall with puckering of the skin and brawny discoloration Back:   normal alignment, no CVAT Skin:  no rash or induration seen on limited exam Musculoskeletal:  grossly normal tone BUE/BLE, good ROM, no bony abnormality Lower extremity: 3-4+ taut LE edema extending up his thighs on onto his abdomen.  Limited foot exam with no ulcerations.  2+ distal pulses. Psychiatric:  grossly normal mood and affect, speech fluent and appropriate, AOx3; somewhat somnolent Neurologic:  CN 2-12 grossly intact, moves all extremities in coordinated fashion, sensation intact    Radiological Exams on Admission: Dg Chest 2 View  Result Date: 04/01/2018 CLINICAL DATA:  47 y/o  M; 1 week of shortness of breath and cough. EXAM: CHEST - 2 VIEW COMPARISON:  07/23/2006 chest radiograph FINDINGS: Mild cardiomegaly. Reticular opacities of the lungs. Moderate right pleural effusion. No pneumothorax. Bones are unremarkable. IMPRESSION: Mild cardiomegaly and moderate right pleural effusion. Reticular opacities may represent interstitial edema or atypical pneumonia. Electronically Signed   By: Mitzi Hansen M.D.   On: 04/01/2018 03:48    EKG: Unable to visualize in Epic and no record by EDP; reviewed on a different computer and it was NSR with nonspecific ST changes without acute ischemia   Labs on Admission: I have personally reviewed the available labs and imaging studies at the time of the admission.  Pertinent labs:   Glucose 154 Magnesium 1.3 BNP 814.4 Troponin 0.02 Normal CBC   Assessment/Plan Principal Problem:   CHF exacerbation (HCC) Active Problems:   Polysubstance abuse (HCC)   Tobacco dependence   Elevated blood pressure reading   Hyperglycemia   CHF,  new -Patient without prior h/o respiratory failure presenting with worsening SOB and orthopnea with anasarca -CXR consistent with pulmonary edema -Normal WBC count, no fever;  will not give antibiotics at this time -Elevated BNP -With elevated BNP and abnl CXR, new-onset CHF seems most probable as diagnosis -Will admit with telemetry -Will request echocardiogram -Will start ASA -Will start Lisinopril 5 mg daily (BP is normal to high and so should support addition of medication) -No beta blocker due to acute decompensation on presentation -CHF order set utilized; may need CHF team consult but will hold until Echo results are  available -Was given Lasix 80 mg x 1 in ER and will repeat with 40 mg BID -Continue prn [place] O2 for now -Normal kidney function at this time, will follow -Repeat EKG in AM -Negative troponin; will not trend due to low suspicion for ACS  Elevated BP -Suspect long-standing HTN that was never diagnosed since he does not see doctors -Currently elevated -Will start Lisinopril 5 mg daily -He is likely to also benefit from initiation of BB prior to d/c  Hyperglycemia -May be stress response but he has the habitus to lead to suspicion about undiagnosed DM -Will follow with accuchecks and check A1c -Cover with moderate-scale SSI  Polysubstance abuse -Patient is taking Ritalin and Xanax multiple times a day; Ritalin is 4 times daily, which seems excessive -Xanax is continued but there is no acute indication for ongoing use of Ritalin while inpatient -He also acknowledges recreational use of marijuana (multiple times weekly) and cocaine -SW consult requested -UDS pending   Tobacco dependence -Encourage cessation.  This was discussed with the patient and should be reviewed on an ongoing basis.  -Patch ordered at patient request.  DVT prophylaxis:  Lovenox Code Status:  Full - confirmed with patient Family Communication: None present Disposition Plan:  Home once  clinically improved Consults called:  Likely to need cardiology post-echo; SW/CM  Admission status: Admit - It is my clinical opinion that admission to INPATIENT is reasonable and necessary because of the expectation that this patient will require hospital care that crosses at least 2 midnights to treat this condition based on the medical complexity of the problems presented.  Given the aforementioned information, the predictability of an adverse outcome is felt to be significant.     Jonah Blue MD [facility]  If note is complete, please contact covering daytime or nighttime physician. [email or web address] Password [facility]  04/01/2018, 11:41 AM

## 2018-04-01 NOTE — ED Notes (Signed)
Pt found to be 85-93% on room air, Pt placed on 2lpm 02 to maintain levels above 94%

## 2018-04-01 NOTE — Progress Notes (Signed)
RN ordered continuous pulse ox for pt. [facility] states they will bring it when available.

## 2018-04-01 NOTE — ED Provider Notes (Addendum)
Emergency Department Provider Note   I have reviewed the triage vital signs and the nursing notes.   HISTORY  Chief Complaint Shortness of Breath   HPI [name] is a 47 y.o. male without known past medical history the presents to the emergency department today secondary to a couple weeks of shortness of breath.  Patient states over the last 2 weeks he has had progressively worsening cough that he initially thought was just a viral bug.  He also notes some abdominal distention and lower extremity edema during that time.  He states that his pant size gone up by about 1 inch.  He states that the shortness of breath got so bad he cannot lie flat without feel like he is going to suffocate or be choked.  He tried bronchodilators without any help.  Never really been sick like this before.  No history of heart attacks.  Does smoke.  No chest pain besides the pressure and feeling of smothering. No other associated or modifying symptoms.    History reviewed. No pertinent past medical history.  There are no active problems to display for this patient.   History reviewed. No pertinent surgical history.    Allergies Patient has no allergy information on record.  No family history on file.  Social History Social History   Tobacco Use  . Smoking status: Current Every Day Smoker    Packs/day: 0.50  . Smokeless tobacco: Never Used  Substance Use Topics  . Alcohol use: Yes    Comment: 4 x week  . Drug use: Not on file    Review of Systems  All other systems negative except as documented in the HPI. All pertinent positives and negatives as reviewed in the HPI. ____________________________________________   PHYSICAL EXAM:  VITAL SIGNS: ED Triage Vitals  Enc Vitals Group     BP 04/01/18 0230 (!) 166/135     Pulse Rate 04/01/18 0230 (!) 110     Resp 04/01/18 0632 17     Temp 04/01/18 0235 (!) 97.4 F (36.3 C)     Temp Source 04/01/18 0235 Oral     SpO2 04/01/18 0230  98 %    Constitutional: Alert and oriented. Well appearing and in no acute distress. Eyes: Conjunctivae are normal. PERRL. EOMI. Head: Atraumatic. Nose: No congestion/rhinnorhea. Mouth/Throat: Mucous membranes are moist.  Oropharynx non-erythematous. Neck: No stridor.  No meningeal signs.   Cardiovascular: tachycardic rate, regular rhythm. Good peripheral circulation. Grossly normal heart sounds.   Respiratory: tachypneic respiratory effort.  No retractions. Lungs diminished on right, wheezing/rales throughout. Gastrointestinal: Soft and nontender. Abdominal distention.  Musculoskeletal: No lower extremity tenderness but has edema to bilateral lower extremities extending to upper thighs. No gross deformities of extremities. Neurologic:  Normal speech and language. No gross focal neurologic deficits are appreciated.  Skin:  Skin is warm, dry and intact. No rash noted.  ____________________________________________   LABS (all labs ordered are listed, but only abnormal results are displayed)  Labs Reviewed  BASIC METABOLIC PANEL  MAGNESIUM  BRAIN NATRIURETIC PEPTIDE  CBC  I-STAT TROPONIN, ED   ____________________________________________  EKG   EKG Interpretation  Date/Time:  Wednesday Apr 01 2018 02:33:58 EDT Ventricular Rate:  109 PR Interval:  164 QRS Duration: 78 QT Interval:  328 QTC Calculation: 441 R Axis:   62 Text Interpretation:  Sinus tachycardia Anteroseptal infarct , age undetermined No significant change since last tracing in may 2007 Confirmed by Marily Memos [phone]) on 04/01/2018 10:36:34 AM  ____________________________________________  RADIOLOGY  Dg Chest 2 View  Result Date: 04/01/2018 CLINICAL DATA:  47 y/o  M; 1 week of shortness of breath and cough. EXAM: CHEST - 2 VIEW COMPARISON:  07/23/2006 chest radiograph FINDINGS: Mild cardiomegaly. Reticular opacities of the lungs. Moderate right pleural effusion. No pneumothorax. Bones are  unremarkable. IMPRESSION: Mild cardiomegaly and moderate right pleural effusion. Reticular opacities may represent interstitial edema or atypical pneumonia. Electronically Signed   By: Mitzi Hansen M.D.   On: 04/01/2018 03:48    ____________________________________________   PROCEDURES  Procedure(s) performed:   Procedures  CRITICAL CARE Performed by: Marily Memos Total critical care time: 35 minutes Critical care time was exclusive of separately billable procedures and treating other patients. Critical care was necessary to treat or prevent imminent or life-threatening deterioration. Critical care was time spent personally by me on the following activities: development of treatment plan with patient and/or surrogate as well as nursing, discussions with consultants, evaluation of patient's response to treatment, examination of patient, obtaining history from patient or surrogate, ordering and performing treatments and interventions, ordering and review of laboratory studies, ordering and review of radiographic studies, pulse oximetry and re-evaluation of patient's condition.  ____________________________________________   INITIAL IMPRESSION / ASSESSMENT AND PLAN / ED COURSE  Here with new onset heart failure. Unclear etiology. Has some mild resp distress (difficulty speaking in full sentences), DOE, orthopnea and nearly anasarca which makes outpatient treatment and workup inappropriate. Discussed with medicine who will admit for further workup and management.    Pertinent labs & imaging results that were available during my care of the patient were reviewed by me and considered in my medical decision making (see chart for details).  ____________________________________________  FINAL CLINICAL IMPRESSION(S) / ED DIAGNOSES  Final diagnoses:  Acute pulmonary edema (HCC)     MEDICATIONS GIVEN DURING THIS VISIT:  Medications  albuterol (PROVENTIL) (2.5 MG/3ML)  0.083% nebulizer solution 5 mg (has no administration in time range)  furosemide (LASIX) injection 80 mg (has no administration in time range)  potassium chloride SA (K-DUR,KLOR-CON) CR tablet 20 mEq (has no administration in time range)     NEW OUTPATIENT MEDICATIONS STARTED DURING THIS VISIT:  New Prescriptions   No medications on file    Note:  This note was prepared with assistance of Dragon voice recognition software. Occasional wrong-word or sound-a-like substitutions may have occurred due to the inherent limitations of voice recognition software.   Marily Memos, MD 04/02/18 1013    Aliveah Gallant, Barbara Cower, MD 04/22/18 [phone]

## 2018-04-01 NOTE — ED Notes (Signed)
Admitting at bedside 

## 2018-04-01 NOTE — Progress Notes (Signed)
Patient was sleeping and had desat into the 50s.  With deep stimulation, he eventually woke up and his sats recovered. He works nights as Engineer, site at [employer].  He takes 2 mg Xanax BID and this is usually buffered with QID Ritalin - which is being held.  Suspect that this chronic upper/downer medication is contributing to his cardiac issue.  Additionally, he has a habitus to suggest OSA.  Will attempt to keep him more awake to try to maintain sats; will decrease Xanax to 0.5 mg TID prn (instead of 2 mg BID standing); and will continue to follow.  Could consider flumazenil but this would precipitate BZD withdrawal and so would prefer to avoid, if possible.  Georgana Curio, M.D.

## 2018-04-01 NOTE — Progress Notes (Signed)
RN placed pt on continuous pulse ox.

## 2018-04-02 ENCOUNTER — Inpatient Hospital Stay (HOSPITAL_COMMUNITY): Payer: Self-pay

## 2018-04-02 LAB — GLUCOSE, CAPILLARY
GLUCOSE-CAPILLARY: 131 mg/dL — AB (ref 65–99)
GLUCOSE-CAPILLARY: 168 mg/dL — AB (ref 65–99)
GLUCOSE-CAPILLARY: 153 mg/dL — AB (ref 65–99)
Glucose-Capillary: 157 mg/dL — ABNORMAL HIGH (ref 65–99)

## 2018-04-02 LAB — CBC WITH DIFFERENTIAL/PLATELET
Abs Immature Granulocytes: 0 10*3/uL (ref 0.0–0.1)
Basophils Absolute: 0 10*3/uL (ref 0.0–0.1)
Basophils Relative: 1 %
EOS ABS: 0.1 10*3/uL (ref 0.0–0.7)
EOS PCT: 1 %
HEMATOCRIT: 46.6 % (ref 39.0–52.0)
Hemoglobin: 14.1 g/dL (ref 13.0–17.0)
IMMATURE GRANULOCYTES: 0 %
LYMPHS ABS: 1.5 10*3/uL (ref 0.7–4.0)
Lymphocytes Relative: 19 %
MCH: 28 pg (ref 26.0–34.0)
MCHC: 30.3 g/dL (ref 30.0–36.0)
MCV: 92.5 fL (ref 78.0–100.0)
MONO ABS: 1 10*3/uL (ref 0.1–1.0)
MONOS PCT: 13 %
NEUTROS PCT: 67 %
Neutro Abs: 5.4 10*3/uL (ref 1.7–7.7)
Platelets: 236 10*3/uL (ref 150–400)
RBC: 5.04 MIL/uL (ref 4.22–5.81)
RDW: 14.7 % (ref 11.5–15.5)
WBC: 8 10*3/uL (ref 4.0–10.5)

## 2018-04-02 LAB — ECHOCARDIOGRAM COMPLETE
Height: 72 in
WEIGHTICAEL: 4867.2 [oz_av]

## 2018-04-02 LAB — LIPID PANEL
Cholesterol: 131 mg/dL (ref 0–200)
HDL: 24 mg/dL — AB (ref >40)
LDL CALC: 88 mg/dL (ref 0–99)
TRIGLYCERIDES: 95 mg/dL (ref <150)
Total CHOL/HDL Ratio: 5.5 RATIO
VLDL: 19 mg/dL (ref 0–40)

## 2018-04-02 LAB — BASIC METABOLIC PANEL
ANION GAP: 12 (ref 5–15)
BUN: 10 mg/dL (ref 6–20)
CALCIUM: 8.8 mg/dL — AB (ref 8.9–10.3)
CO2: 30 mmol/L (ref 22–32)
Chloride: 96 mmol/L — ABNORMAL LOW (ref 101–111)
Creatinine, Ser: 0.86 mg/dL (ref 0.61–1.24)
GFR calc Af Amer: >60 mL/min (ref >60)
GFR calc non Af Amer: >60 mL/min (ref >60)
GLUCOSE: 109 mg/dL — AB (ref 65–99)
POTASSIUM: 4.5 mmol/L (ref 3.5–5.1)
Sodium: 138 mmol/L (ref 135–145)

## 2018-04-02 LAB — RAPID URINE DRUG SCREEN, HOSP PERFORMED
Amphetamines: NOT DETECTED
BENZODIAZEPINES: POSITIVE — AB
Barbiturates: NOT DETECTED
Cocaine: POSITIVE — AB
Opiates: NOT DETECTED
Tetrahydrocannabinol: NOT DETECTED

## 2018-04-02 LAB — HIV ANTIBODY (ROUTINE TESTING W REFLEX): HIV Screen 4th Generation wRfx: NONREACTIVE

## 2018-04-02 LAB — MAGNESIUM: Magnesium: 1.2 mg/dL — ABNORMAL LOW (ref 1.7–2.4)

## 2018-04-02 MED ORDER — LORAZEPAM 2 MG/ML IJ SOLN
0.0000 mg | Freq: Two times a day (BID) | INTRAMUSCULAR | Status: AC
  Administered 2018-04-04: 1 mg via INTRAVENOUS
  Filled 2018-04-02: qty 1

## 2018-04-02 MED ORDER — MAGNESIUM SULFATE 4 GM/100ML IV SOLN
4.0000 g | Freq: Once | INTRAVENOUS | Status: AC
  Administered 2018-04-02: 4 g via INTRAVENOUS
  Filled 2018-04-02: qty 100

## 2018-04-02 MED ORDER — ADULT MULTIVITAMIN W/MINERALS CH
1.0000 | ORAL_TABLET | Freq: Every day | ORAL | Status: DC
  Administered 2018-04-02 – 2018-04-08 (×7): 1 via ORAL
  Filled 2018-04-02 (×7): qty 1

## 2018-04-02 MED ORDER — VITAMIN B-1 100 MG PO TABS
100.0000 mg | ORAL_TABLET | Freq: Every day | ORAL | Status: DC
  Administered 2018-04-02 – 2018-04-08 (×7): 100 mg via ORAL
  Filled 2018-04-02 (×7): qty 1

## 2018-04-02 MED ORDER — SPIRONOLACTONE 25 MG PO TABS
25.0000 mg | ORAL_TABLET | Freq: Every day | ORAL | Status: DC
Start: 2018-04-02 — End: 2018-04-08
  Administered 2018-04-02 – 2018-04-08 (×7): 25 mg via ORAL
  Filled 2018-04-02 (×7): qty 1

## 2018-04-02 MED ORDER — NICOTINE 14 MG/24HR TD PT24: 14.0000 mg | MEDICATED_PATCH | Freq: Every day | TRANSDERMAL | Status: DC

## 2018-04-02 MED ORDER — THIAMINE HCL 100 MG/ML IJ SOLN
100.0000 mg | Freq: Every day | INTRAMUSCULAR | Status: DC
  Filled 2018-04-02: qty 2

## 2018-04-02 MED ORDER — FOLIC ACID 1 MG PO TABS
1.0000 mg | ORAL_TABLET | Freq: Every day | ORAL | Status: DC
  Administered 2018-04-02 – 2018-04-08 (×7): 1 mg via ORAL
  Filled 2018-04-02 (×7): qty 1

## 2018-04-02 MED ORDER — LORAZEPAM 2 MG/ML IJ SOLN
0.0000 mg | Freq: Four times a day (QID) | INTRAMUSCULAR | Status: AC
  Administered 2018-04-02: 1 mg via INTRAVENOUS
  Administered 2018-04-02: 2 mg via INTRAVENOUS
  Administered 2018-04-02: 1 mg via INTRAVENOUS
  Administered 2018-04-03: 2 mg via INTRAVENOUS
  Administered 2018-04-03: 1 mg via INTRAVENOUS
  Administered 2018-04-03: 2 mg via INTRAVENOUS
  Filled 2018-04-02 (×6): qty 1

## 2018-04-02 NOTE — Progress Notes (Signed)
CCMD notified RN that pt had 17 beat run of Vtach. PT was asleep. PT vital signs stable. On call notified. Will continue to monitor.

## 2018-04-02 NOTE — Progress Notes (Signed)
PROGRESS NOTE    [name]  ZOX:[id number] DOB: [birth year] DOA: 04/01/2018 PCP: Patient, No Pcp Per  Brief Narrative:46 y.o. male with no significant past medical history presenting with SOB.  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  He has noticed SOB.  It kept getting progressively worse.  He was struggling for air and decided to come to the ER.  Symptoms started about 2 weeks ago.  He has had progressive edema of his feet and legs as well as his abdomen for that same period of time.  +orthopnea.  +PND.  +cough productive of "darker, like brown" sputum.  Denies chest pain.  His right knee has been hurting with swelling, hurts to walk.  ED Course:  New CHF.  Edema to proximal thighs. Chest pressure with orthopnea.  Symptoms for a few weeks.  Tachypnea and mild tachycardia.  Pleural effusion on right, interstitial edema.  Elevated BNP.  Negative troponin.   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  2]uncontrolled undiagnosed htn better continue current meds.  3]hyperglycemia-hba1c 6.9.continue ssi for now .will start po agents once cardiac wu done.  4]alcohol abuse ciwa.  5]tobacco abuse nicotine patch  6]polysubstance abuse counseling   7]hypomagnesemia-replete.   DVT prophylaxis:lovenox Code Status:full Family Communication:none Disposition Plan: tbd  Consultants: cards Procedures:none Antimicrobials:none Subjective:works night shift ..wants to sleep.breathing better.   Objective:snoring Vitals:   04/02/18 0239 04/02/18 0438 04/02/18 0515 04/02/18 0801  BP: (!) 129/98 (!) 134/96  (!) 139/106  Pulse: (!) 108 (!) 104 (!) 113 (!) 109  Resp: 14 20      Temp: 97.7 F (36.5 C) 98.6 F (37 C)    TempSrc: Oral     SpO2: 99% 100%  98%  Weight:  (!) 138 kg (304 lb 3.2 oz)    Height:        Intake/Output Summary (Last 24 hours) at 04/02/2018 1256 Last data filed at 04/02/2018 1234 Gross per 24 hour  Intake 720 ml  Output 2775 ml  Net -2055 ml   Filed Weights   04/01/18 1720 04/02/18 0438  Weight: (!) 138 kg (304 lb 4.8 oz) (!) 138 kg (304 lb 3.2 oz)    Examination:  General exam: Appears calm and comfortable  Respiratory system: Crackles b/l auscultation. Respiratory effort normal. Cardiovascular system: S1 & S2 heard, RRR. No JVD, murmurs, rubs, gallops or clicks. No pedal edema. Gastrointestinal system: Abdomen is nondistended, soft and nontender. No organomegaly or masses felt. Normal bowel sounds heard. Central nervous system: Alert and oriented. No focal neurological deficits. Extremities: edema upto thighs Skin: No rashes, lesions or ulcers Psychiatry: Judgement and insight appear normal. Mood & affect appropriate.     Data Reviewed: I have personally reviewed following labs and imaging studies  CBC: Recent Labs  Lab 04/01/18 0816 04/02/18 1758  WBC 7.8 8.0  NEUTROABS  --  5.4  HGB 14.2 14.1  HCT 45.9 46.6  MCV 90.7 92.5  PLT 231 236   Basic Metabolic Panel: Recent Labs  Lab 04/01/18 0816 04/02/18 0445  NA 134* 138  K 4.0 4.5  CL 102 96*  CO2 24 30  GLUCOSE 154* 109*  BUN 10 10  CREATININE 0.67  0.86  CALCIUM 8.5* 8.8*  MG 1.3* 1.2*   GFR: Estimated Creatinine Clearance: 152.9 mL/min (by C-G formula based on SCr of 0.86 mg/dL). Liver Function Tests: No results for input(s): AST, ALT, ALKPHOS, BILITOT, PROT, ALBUMIN in the last 168 hours. No results for input(s): LIPASE, AMYLASE in the last 168 hours. No results for input(s): AMMONIA in the last 168 hours. Coagulation Profile: No results for input(s): INR, PROTIME in the last 168 hours. Cardiac Enzymes: No results for input(s): CKTOTAL, CKMB,  CKMBINDEX, TROPONINI in the last 168 hours. BNP (last 3 results) No results for input(s): PROBNP in the last 8760 hours. HbA1C: Recent Labs    04/01/18 1758  HGBA1C 6.9*   CBG: Recent Labs  Lab 04/01/18 1235 04/01/18 1653 04/01/18 2110 04/02/18 0731 04/02/18 1129  GLUCAP 115* 143* 171* 131* 157*   Lipid Profile: Recent Labs    04/02/18 0445  CHOL 131  HDL 24*  LDLCALC 88  TRIG 95  CHOLHDL 5.5   Thyroid Function Tests: Recent Labs    04/01/18 1758  TSH 1.999   Anemia Panel: No results for input(s): VITAMINB12, FOLATE, FERRITIN, TIBC, IRON, RETICCTPCT in the last 72 hours. Sepsis Labs: No results for input(s): PROCALCITON, LATICACIDVEN in the last 168 hours.  No results found for this or any previous visit (from the past 240 hour(s)).       Radiology Studies: Dg Chest 2 View  Result Date: 04/01/2018 CLINICAL DATA:  47 y/o  M; 1 week of shortness of breath and cough. EXAM: CHEST - 2 VIEW COMPARISON:  07/23/2006 chest radiograph FINDINGS: Mild cardiomegaly. Reticular opacities of the lungs. Moderate right pleural effusion. No pneumothorax. Bones are unremarkable. IMPRESSION: Mild cardiomegaly and moderate right pleural effusion. Reticular opacities may represent interstitial edema or atypical pneumonia. Electronically Signed   By: Mitzi Hansen M.D.   On: 04/01/2018 03:48        Scheduled Meds: . aspirin EC  81 mg Oral Daily  . enoxaparin (LOVENOX) injection  40 mg Subcutaneous Q24H  . folic acid  1 mg Oral Daily  . furosemide  40 mg Intravenous Q12H  . insulin aspart  0-15 Units Subcutaneous TID WC  . insulin aspart  0-5 Units Subcutaneous QHS  . LORazepam  0-4 mg Intravenous Q6H   Followed by  . [START ON 04/04/2018] LORazepam  0-4 mg Intravenous Q12H  . mouth rinse  15 mL Mouth Rinse BID  . multivitamin with minerals  1 tablet Oral Daily  . nicotine  14 mg Transdermal Daily  . sodium chloride flush  3 mL Intravenous Q12H  . spironolactone   25 mg Oral Daily  . thiamine  100 mg Oral Daily   Or  . thiamine  100 mg Intravenous Daily   Continuous Infusions: . sodium chloride       LOS: 1 day     Alwyn Ren, MD [facility]  If 7PM-7AM, please contact night-coverage [email or web address] Password [email or web address] 04/02/2018, 12:56 PM

## 2018-04-02 NOTE — Care Management Note (Signed)
Case Management Note  Patient Details  Name: [name] MRN: [id number] Date of Birth: 1970/11/05  Subjective/Objective:   New onset CHF                 Action/Plan: NCM spoke to pt and girlfriend, [name] at bedside. Fiance states pt has insurance coverage. He has information on his phone and provided to registration in ED but has not received his card. Recently started new job and works full-time. He was in the process of locating a PCP. Spoke to his insurance provider about his card. Will continue to follow for dc needs.   Expected Discharge Date:             Expected Discharge Plan:  Home/Self Care  In-House Referral:  NA  Discharge planning Services  CM Consult  Post Acute Care Choice:  NA Choice offered to:  NA  DME Arranged:  N/A DME Agency:  NA  HH Arranged:  NA HH Agency:  NA  Status of Service:  Completed, signed off  If discussed at Long Length of Stay Meetings, dates discussed:    Additional Comments:  Elliot Cousin, RN 04/02/2018, 5:57 PM

## 2018-04-02 NOTE — Progress Notes (Signed)
Chaplain presented to the patient's room  to provide documentation for an Advance Directive. The patient was sleeping at the time of this visit, and was not alert enough to complete the Advance Directive. The documentation was left at the patient's bedside. Chaplain follow up would be appreciated for support if needed. Chaplain Janell Quiet [phone]

## 2018-04-02 NOTE — Plan of Care (Signed)
  Problem: Education: Goal: Knowledge of General Education information will improve Outcome: Progressing   Problem: Clinical Measurements: Goal: Ability to maintain clinical measurements within normal limits will improve Outcome: Progressing   Problem: Activity: Goal: Risk for activity intolerance will decrease Outcome: Progressing   

## 2018-04-02 NOTE — Progress Notes (Addendum)
RN placed black cigarette lighter from pt's room in biohazard bag in pt's chart per charge RN instruction. Pt is aware and okay with this.

## 2018-04-02 NOTE — Progress Notes (Signed)
Pt magnesium 1.2.  Paged on call provider. Will continue to monitor.

## 2018-04-02 NOTE — Progress Notes (Signed)
  Echocardiogram 2D Echocardiogram has been performed.  Leta Jungling M 04/02/2018, 9:10 AM

## 2018-04-02 NOTE — Consult Note (Addendum)
Reason for Consult: [facility] Referring Physician: Jacki Cones, MD  [name] is an 47 y.o. male.  HPI:   47 year old Caucasian male admitted with shortness of breath.  The patient has experienced worsening exertional dyspnea, now with minimal walking up 200 yards, bilateral leg swelling. The symptoms have been ongoing for several weeks, they have been particularly worse over the last 2 weeks. He denies any chest pain. He does have orthopnea, no paroxysmal nocturnal dyspnea. He denies any significant family history of heart disease. He does snore at night, although never been officially diagnosed with sleep apnea.  Patient has significant tobacco (24-pack-year) and alcohol abuse history (6 shots of Patron, and sometime beers, everyday for last 6-7 years, on weekends prior to that). He works at a [employer] 6 PM to 6 AM every day, during which he drinks everyday. Patient has been on alcohol withdrawal protocol while in the hospital.  Workup so far shows EKG with biatrial enlargement, echocardiogram with severe biventricular failure with grade 3 diastolic dysfunction and elevated left atrial pressure. Mild to moderate tricuspid regurgitation with pulmonary hypertension estimated PA 50 mmHg. NSVT seen on monitor.   Past Medical History:  Diagnosis Date  . Diverticulitis     Past Surgical History:  Procedure Laterality Date  . COLECTOMY     for diverticulitis    Family History  Problem Relation Age of Onset  . Congestive Heart Failure Neg Hx     Social History:  reports that he has been smoking.  He has a 24.00 pack-year smoking history. He has never used smokeless tobacco. He reports that he drinks alcohol. He reports that he has current or past drug history. Drug: Marijuana.  Allergies: No Known Allergies  Medications: I have reviewed the patient's current medications.  Results for orders placed or performed during the hospital encounter of 04/01/18 (from the past 48  hour(s))  I-stat troponin, ED     Status: None   Collection Time: 04/01/18  2:42 AM  Result Value Ref Range   Troponin i, poc 0.02 0.00 - 0.08 ng/mL   Comment 3            Comment: Due to the release kinetics of cTnI, a negative result within the first hours of the onset of symptoms does not rule out myocardial infarction with certainty. If myocardial infarction is still suspected, repeat the test at appropriate intervals.   Urine rapid drug screen (hosp performed)     Status: Abnormal   Collection Time: 04/01/18  2:58 AM  Result Value Ref Range   Opiates NONE DETECTED NONE DETECTED   Cocaine POSITIVE (A) NONE DETECTED   Benzodiazepines POSITIVE (A) NONE DETECTED   Amphetamines NONE DETECTED NONE DETECTED   Tetrahydrocannabinol NONE DETECTED NONE DETECTED   Barbiturates NONE DETECTED NONE DETECTED    Comment: (NOTE) DRUG SCREEN FOR MEDICAL PURPOSES ONLY.  IF CONFIRMATION IS NEEDED FOR ANY PURPOSE, NOTIFY LAB WITHIN 5 DAYS. LOWEST DETECTABLE LIMITS FOR URINE DRUG SCREEN Drug Class                     Cutoff (ng/mL) Amphetamine and metabolites    1000 Barbiturate and metabolites    200 Benzodiazepine                 573 Tricyclics and metabolites     300 Opiates and metabolites        300 Cocaine and metabolites        300 THC  50 Performed at [facility], [place] [address], [place], [place] [address]   Basic metabolic panel     Status: Abnormal   Collection Time: 04/01/18  8:16 AM  Result Value Ref Range   Sodium 134 (L) 135 - 145 mmol/L   Potassium 4.0 3.5 - 5.1 mmol/L   Chloride 102 101 - 111 mmol/L   CO2 24 22 - 32 mmol/L   Glucose, Bld 154 (H) 65 - 99 mg/dL   BUN 10 6 - 20 mg/dL   Creatinine, Ser 0.67 0.61 - 1.24 mg/dL   Calcium 8.5 (L) 8.9 - 10.3 mg/dL   GFR calc non Af Amer >60 >60 mL/min   GFR calc Af Amer >60 >60 mL/min    Comment: (NOTE) The eGFR has been calculated using the CKD EPI equation. This calculation has not been  validated in all clinical situations. eGFR's persistently <60 mL/min signify possible Chronic Kidney Disease.    Anion gap 8 5 - 15    Comment: Performed at [facility] [address] [place], [place] [address]  Magnesium     Status: Abnormal   Collection Time: 04/01/18  8:16 AM  Result Value Ref Range   Magnesium 1.3 (L) 1.7 - 2.4 mg/dL    Comment: Performed at [facility] [address], [place] [address]  CBC     Status: None   Collection Time: 04/01/18  8:16 AM  Result Value Ref Range   WBC 7.8 4.0 - 10.5 K/uL   RBC 5.06 4.22 - 5.81 MIL/uL   Hemoglobin 14.2 13.0 - 17.0 g/dL   HCT 45.9 39.0 - 52.0 %   MCV 90.7 78.0 - 100.0 fL   MCH 28.1 26.0 - 34.0 pg   MCHC 30.9 30.0 - 36.0 g/dL   RDW 14.5 11.5 - 15.5 %   Platelets 231 150 - 400 K/uL    Comment: Performed at [facility], [place] [address] [place], [place] [address]  Brain natriuretic peptide (order ONLY if patient c/o SOB)     Status: Abnormal   Collection Time: 04/01/18  9:08 AM  Result Value Ref Range   B Natriuretic Peptide 814.4 (H) 0.0 - 100.0 pg/mL    Comment: Performed at [facility] [address] [place],  [address]  CBG monitoring, ED     Status: Abnormal   Collection Time: 04/01/18 12:35 PM  Result Value Ref Range   Glucose-Capillary 115 (H) 65 - 99 mg/dL  CBG monitoring, ED     Status: Abnormal   Collection Time: 04/01/18  4:53 PM  Result Value Ref Range   Glucose-Capillary 143 (H) 65 - 99 mg/dL  HIV antibody (Routine Testing)     Status: None   Collection Time: 04/01/18  5:58 PM  Result Value Ref Range   HIV Screen 4th Generation wRfx Non Reactive Non Reactive    Comment: (NOTE) Performed At: [facility] [address] [place], [place] [id number] Rush Farmer MD KA:[id number] Performed at [facility], [place] [address] [place], [place] [address]   Hemoglobin A1c     Status: Abnormal   Collection Time: 04/01/18  5:58 PM  Result Value Ref  Range   Hgb A1c MFr Bld 6.9 (H) 4.8 - 5.6 %    Comment: (NOTE) Pre diabetes:          5.7%-6.4% Diabetes:              >6.4%  Glycemic control for   <7.0% adults with diabetes    Mean Plasma Glucose 151.33 mg/dL    Comment: Performed at [facility] [address], [place], [place] [address]  TSH     Status: None   Collection Time: 04/01/18  5:58 PM  Result Value Ref Range   TSH 1.999 0.350 - 4.500 uIU/mL    Comment: Performed by a 3rd Generation assay with a functional sensitivity of <=0.01 uIU/mL. Performed at [facility], [place] [address], [place], [place] [address]   Glucose, capillary     Status: Abnormal   Collection Time: 04/01/18  9:10 PM  Result Value Ref Range   Glucose-Capillary 171 (H) 65 - 99 mg/dL  Basic metabolic panel     Status: Abnormal   Collection Time: 04/02/18  4:45 AM  Result Value Ref Range   Sodium 138 135 - 145 mmol/L   Potassium 4.5 3.5 - 5.1 mmol/L   Chloride 96 (L) 101 - 111 mmol/L   CO2 30 22 - 32 mmol/L   Glucose, Bld 109 (H) 65 - 99 mg/dL   BUN 10 6 - 20 mg/dL   Creatinine, Ser 0.86 0.61 - 1.24 mg/dL   Calcium 8.8 (L) 8.9 - 10.3 mg/dL   GFR calc non Af Amer >60 >60 mL/min   GFR calc Af Amer >60 >60 mL/min    Comment: (NOTE) The eGFR has been calculated using the CKD EPI equation. This calculation has not been validated in all clinical situations. eGFR's persistently <60 mL/min signify possible Chronic Kidney Disease.    Anion gap 12 5 - 15    Comment: Performed at [facility] [address], [place], [place] [address]  Magnesium     Status: Abnormal   Collection Time: 04/02/18  4:45 AM  Result Value Ref Range   Magnesium 1.2 (L) 1.7 - 2.4 mg/dL    Comment: Performed at [facility] [address], [place], [place] [address]  Lipid panel     Status: Abnormal   Collection Time: 04/02/18  4:45 AM  Result Value Ref Range   Cholesterol 131 0 - 200 mg/dL   Triglycerides 95 <150 mg/dL   HDL 24 (L) >40 mg/dL    Total CHOL/HDL Ratio 5.5 RATIO   VLDL 19 0 - 40 mg/dL   LDL Cholesterol 88 0 - 99 mg/dL    Comment:        Total Cholesterol/HDL:CHD Risk Coronary Heart Disease Risk Table                     Men   Women  1/2 Average Risk   3.4   3.3  Average Risk       5.0   4.4  2 X Average Risk   9.6   7.1  3 X Average Risk  23.4   11.0        Use the calculated Patient Ratio above and the CHD Risk Table to determine the patient's CHD Risk.        ATP III CLASSIFICATION (LDL):  <100     mg/dL   Optimal  100-129  mg/dL   Near or Above                    Optimal  130-159  mg/dL   Borderline  160-189  mg/dL   High  >190     mg/dL   Very High Performed at [facility], [place] [address] [place], [place] [address]   Glucose, capillary     Status: Abnormal   Collection Time: 04/02/18  7:31 AM  Result Value Ref Range   Glucose-Capillary 131 (H) 65 - 99 mg/dL  CBC WITH DIFFERENTIAL     Status: None   Collection Time: 04/02/18  5:58 PM  Result Value Ref Range   WBC 8.0 4.0 - 10.5 K/uL   RBC 5.04 4.22 - 5.81 MIL/uL   Hemoglobin 14.1 13.0 - 17.0 g/dL   HCT 46.6 39.0 - 52.0 %   MCV 92.5 78.0 - 100.0 fL   MCH 28.0 26.0 - 34.0 pg   MCHC 30.3 30.0 - 36.0 g/dL   RDW 14.7 11.5 - 15.5 %   Platelets 236 150 - 400 K/uL   Neutrophils Relative % 67 %   Neutro Abs 5.4 1.7 - 7.7 K/uL   Lymphocytes Relative 19 %   Lymphs Abs 1.5 0.7 - 4.0 K/uL   Monocytes Relative 13 %   Monocytes Absolute 1.0 0.1 - 1.0 K/uL   Eosinophils Relative 1 %   Eosinophils Absolute 0.1 0.0 - 0.7 K/uL   Basophils Relative 1 %   Basophils Absolute 0.0 0.0 - 0.1 K/uL   Immature Granulocytes 0 %   Abs Immature Granulocytes 0.0 0.0 - 0.1 K/uL    Comment: Performed at [facility], [address] [place], [place] [address]    Dg Chest 2 View  Result Date: 04/01/2018 CLINICAL DATA:  47 y/o  M; 1 week of shortness of breath and cough. EXAM: CHEST - 2 VIEW COMPARISON:  07/23/2006 chest radiograph FINDINGS: Mild  cardiomegaly. Reticular opacities of the lungs. Moderate right pleural effusion. No pneumothorax. Bones are unremarkable. IMPRESSION: Mild cardiomegaly and moderate right pleural effusion. Reticular opacities may represent interstitial edema or atypical pneumonia. Electronically Signed   By: Kristine Garbe M.D.   On: 04/01/2018 03:48   EKG 04/02/2018: Sinus tachycardia 107 bpm. Biatrial enlargement. Normal axis, normal conduction. Possible old anteroseptal infarct. No ischemic changes.  Echocardiogram 04/02/2018: Study Conclusions  - Left ventricle: The cavity size was moderately dilated. Wall   thickness was normal. Systolic function was severely reduced. The   estimated ejection fraction was in the range of 15% to 20%. There   is severe global hypokinesis with no regional differences in the   wall motion. Doppler parameters are consistent with a reversible   restrictive pattern, indicative of decreased left ventricular   diastolic compliance and/or increased left atrial pressure (grade   3 diastolic dysfunction). - Right ventricle: The cavity size was mildly dilated. Systolic   function was moderately reduced. - Right atrium: The atrium was mildly dilated. - Tricuspid valve: Mildly dilated annulus. There was mild-moderate   regurgitation.  Impressions:  - Dilated cardiomyopathy with severe biventricular failure. Visual   LVEF 15-20%.   Mild to mod tricuspid regurgitation with elevated central venous   pressure.   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  Review of Systems  Constitutional: Positive for malaise/fatigue.  HENT: Negative.   Eyes: Negative.   Respiratory: Positive for cough and shortness of breath.   Cardiovascular: Positive for orthopnea and leg swelling. Negative for chest pain, claudication and PND.  Gastrointestinal: Negative for nausea and vomiting.  Genitourinary: Negative.   Musculoskeletal: Negative.   Skin: Negative.    Neurological: Negative for dizziness and loss of consciousness.  Endo/Heme/Allergies: Does not bruise/bleed easily.  Psychiatric/Behavioral: Negative.   All other systems  reviewed and are negative.  Blood pressure (!) 139/106, pulse (!) 109, temperature 98.6 F (37 C), resp. rate 20, height 6' (1.829 m), weight (!) 138 kg (304 lb 3.2 oz), SpO2 98 %. Physical Exam  Nursing note and vitals reviewed. Constitutional: He is oriented to person, place, and time. No distress.  Morbidly obese. Witnessed apneic and hypoxic episode during sleep.  Cardiovascular: Regular rhythm. Tachycardia present. Exam reveals gallop.  No murmur heard. Respiratory: Effort normal. No respiratory distress. He has no wheezes. He has rales.  Witnessed apneic spells with hypoxia was sleeping with loud snoring  GI: Soft. Bowel sounds are normal. He exhibits distension. There is no tenderness.  Musculoskeletal: He exhibits edema (1+ Rt, 3+ Lt).  Neurological: He is alert and oriented to person, place, and time. No cranial nerve deficit.  Skin: Skin is warm and dry.  Psychiatric: He has a normal mood and affect.    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 However, he does have risk factors for CAD and will need ischemic evaluation. 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 Good urine output with Lasix 40 mg IV daily. Continue the same. He has at least 15-20 pounds more to diurese. Added spironolactone 25 mg daily. Stop lisinopril. After adequate diuresis and 36-48 hours without ACEi, will add Entresto probably on 04/04/2018. Will add beta blocker after he tolerates ARNI and ARB well. If clinical improvement and volume status, will plan for left and right  heart catheterization on Monday 06/02 or Tuesday 06/03. Continue management of alcohol withdrawal as you're doing. Recommend adding CPAP during sleep. 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 He should wear CPAP whenever he sleeps. Will need formal outpatient sleep study.  Paolina Karwowski J Herchel Hopkin 04/02/2018, 9:59 AM   [place], MD [facility] Pager: [phone] Office: [phone] If no answer Cell [phone]

## 2018-04-03 LAB — CBC WITH DIFFERENTIAL/PLATELET
ABS IMMATURE GRANULOCYTES: 0 10*3/uL (ref 0.0–0.1)
Basophils Absolute: 0 10*3/uL (ref 0.0–0.1)
Basophils Relative: 0 %
Eosinophils Absolute: 0.2 10*3/uL (ref 0.0–0.7)
Eosinophils Relative: 2 %
HEMATOCRIT: 46 % (ref 39.0–52.0)
Hemoglobin: 13.5 g/dL (ref 13.0–17.0)
IMMATURE GRANULOCYTES: 0 %
LYMPHS ABS: 1.7 10*3/uL (ref 0.7–4.0)
Lymphocytes Relative: 24 %
MCH: 27.4 pg (ref 26.0–34.0)
MCHC: 29.3 g/dL — ABNORMAL LOW (ref 30.0–36.0)
MCV: 93.5 fL (ref 78.0–100.0)
MONO ABS: 0.8 10*3/uL (ref 0.1–1.0)
Monocytes Relative: 12 %
NEUTROS ABS: 4.3 10*3/uL (ref 1.7–7.7)
Neutrophils Relative %: 62 %
Platelets: 233 10*3/uL (ref 150–400)
RBC: 4.92 MIL/uL (ref 4.22–5.81)
RDW: 14.7 % (ref 11.5–15.5)
WBC: 7.1 10*3/uL (ref 4.0–10.5)

## 2018-04-03 LAB — COMPREHENSIVE METABOLIC PANEL
ALBUMIN: 2.8 g/dL — AB (ref 3.5–5.0)
ALK PHOS: 93 U/L (ref 38–126)
ALT: 24 U/L (ref 17–63)
ANION GAP: 7 (ref 5–15)
AST: 25 U/L (ref 15–41)
BUN: 16 mg/dL (ref 6–20)
CHLORIDE: 96 mmol/L — AB (ref 101–111)
CO2: 34 mmol/L — AB (ref 22–32)
CREATININE: 0.78 mg/dL (ref 0.61–1.24)
Calcium: 8.4 mg/dL — ABNORMAL LOW (ref 8.9–10.3)
GFR calc non Af Amer: >60 mL/min (ref >60)
GLUCOSE: 111 mg/dL — AB (ref 65–99)
Potassium: 4.5 mmol/L (ref 3.5–5.1)
SODIUM: 137 mmol/L (ref 135–145)
Total Bilirubin: 1.2 mg/dL (ref 0.3–1.2)
Total Protein: 6.7 g/dL (ref 6.5–8.1)

## 2018-04-03 LAB — GLUCOSE, CAPILLARY
GLUCOSE-CAPILLARY: 131 mg/dL — AB (ref 65–99)
GLUCOSE-CAPILLARY: 132 mg/dL — AB (ref 65–99)
Glucose-Capillary: 127 mg/dL — ABNORMAL HIGH (ref 65–99)
Glucose-Capillary: 136 mg/dL — ABNORMAL HIGH (ref 65–99)

## 2018-04-03 MED ORDER — LIVING WELL WITH DIABETES BOOK
Freq: Once | Status: AC
  Administered 2018-04-04: 06:00:00
  Filled 2018-04-03: qty 1

## 2018-04-03 NOTE — Progress Notes (Signed)
PROGRESS NOTE    [name]  ZOX:[id number] DOB: Jan 03, 1971 DOA: 04/01/2018 PCP: Patient, No Pcp Per  Brief Narrative:46 y.o.malewithno significant pastmedical historypresenting withSOB. 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 He has noticed SOB. It kept getting progressively worse. He was struggling for air and decided to come to the ER. Symptoms started about 2 weeks ago. He has had progressive edema of his feet and legs as well as his abdomen for that same period of time. +orthopnea. +PND. +cough productive of "darker, like brown" sputum. Denies chest pain. His right knee has been hurting with swelling, hurts to walk.  ED Course:New CHF. Edema to proximal thighs. Chest pressure with orthopnea. Symptoms for a few weeks. Tachypnea and mild tachycardia. Pleural effusion on right, interstitial edema. Elevated BNP. Negative troponin.     Assessment & Plan:   Principal Problem:   CHF exacerbation (HCC) Active Problems:   Polysubstance abuse (HCC)   Tobacco dependence   Elevated blood pressure reading   Hyperglycemia   1] new onset CHF-diet patient with dilated cardiomyopathy and severe biventricular failure with ejection fraction 15 to 20%.  Patient reports that he is feeling better after diuresis.   he did use his CPAP last night but was very uncomfortable.  He is negative by 2.9 L since admission.  His weight is come down from 304 pounds at the time of admission to 299 today.  He still has a lot of edema and fluid buildup.  Continue Lasix 40 every 12 along with spironolactone 25 mg daily.  He is not on an ACE inhibitor at this time as cardiology thinks of starting him on Entresto prior to discharge.  It looks like he probably will be here over the weekend with ongoing diuresis and may be cath on Monday.  He is at high risk of readmission so will maximize  treatment prior to discharge.  2] hypertension his blood  pressure is better than yesterday.  Continue Lasix and Aldactone.  3] undiagnosed type 2 diabetes-continue SSI for now will get diabetic educator to come and speak with the patient.  4] hypomagnesemia recheck levels tomorrow it was repleted yesterday.  5] polysubstance abuse/alcohol abuse/tobacco abuse-patient reports that he does use marijuana plus alcohol on a daily basis.  Discussed with him the importance of stopping alcohol tobacco and illegal drugs.  6]Un diagnosed sleep apnea patient will need sleep test as an outpatient  DVT prophylaxis: Lovenox Code Status full code Family Communication: No family available Disposition Plan: Plan DC after cath Monday or Tuesday.  Consultants:  Cardiology Procedures: None Antimicrobials: None  Subjective: He is resting in bed head elevated speaking in full sentences breathing better than yesterday does not like CPAP.  Objective: Vitals:   04/03/18 0004 04/03/18 0028 04/03/18 0134 04/03/18 0409  BP: (!) 141/101 (!) 137/101 117/81 (!) 125/101  Pulse: (!) 106 (!) 105 (!) 101 100  Resp: 20   20  Temp: 98.4 F (36.9 C)   (!) 97.5 F (36.4 C)  TempSrc: Oral   Oral  SpO2: 98%   93%  Weight:    135.8 kg (299 lb 4.8 oz)  Height:        Intake/Output Summary (Last 24 hours) at 04/03/2018 1025 Last data filed at 04/03/2018 0925 Gross per 24 hour  Intake 1180 ml  Output 4100 ml  Net -2920 ml   Filed Weights   04/01/18 1720 04/02/18 0438 04/03/18 0409  Weight: Marland Kitchen)  138 kg (304 lb 4.8 oz) (!) 138 kg (304 lb 3.2 oz) 135.8 kg (299 lb 4.8 oz)    Examination:  General exam: Appears calm and comfortable  Respiratory system: Decreased breath sounds bilaterally at the bases to auscultation. Respiratory effort normal. Cardiovascular system: S1 & S2 heard, RRR. No JVD, murmurs, rubs, gallops or clicks. No pedal edema. Gastrointestinal system: Abdomen is nondistended, soft and nontender. No organomegaly or masses felt. Normal bowel sounds  heard. Central nervous system: Alert and oriented. No focal neurological deficits. Extremities: 2+ pitting edema Skin: No rashes, lesions or ulcers Psychiatry: Judgement and insight appear normal. Mood & affect appropriate.     Data Reviewed: I have personally reviewed following labs and imaging studies  CBC: Recent Labs  Lab 04/01/18 0816 04/02/18 1758 04/03/18 0417  WBC 7.8 8.0 7.1  NEUTROABS  --  5.4 4.3  HGB 14.2 14.1 13.5  HCT 45.9 46.6 46.0  MCV 90.7 92.5 93.5  PLT 231 236 233   Basic Metabolic Panel: Recent Labs  Lab 04/01/18 0816 04/02/18 0445 04/03/18 0417  NA 134* 138 137  K 4.0 4.5 4.5  CL 102 96* 96*  CO2 24 30 34*  GLUCOSE 154* 109* 111*  BUN 10 10 16   CREATININE 0.67 0.86 0.78  CALCIUM 8.5* 8.8* 8.4*  MG 1.3* 1.2*  --    GFR: Estimated Creatinine Clearance: 162.9 mL/min (by C-G formula based on SCr of 0.78 mg/dL). Liver Function Tests: Recent Labs  Lab 04/03/18 0417  AST 25  ALT 24  ALKPHOS 93  BILITOT 1.2  PROT 6.7  ALBUMIN 2.8*   No results for input(s): LIPASE, AMYLASE in the last 168 hours. No results for input(s): AMMONIA in the last 168 hours. Coagulation Profile: No results for input(s): INR, PROTIME in the last 168 hours. Cardiac Enzymes: No results for input(s): CKTOTAL, CKMB, CKMBINDEX, TROPONINI in the last 168 hours. BNP (last 3 results) No results for input(s): PROBNP in the last 8760 hours. HbA1C: Recent Labs    04/01/18 1758  HGBA1C 6.9*   CBG: Recent Labs  Lab 04/02/18 0731 04/02/18 1129 04/02/18 1632 04/02/18 2112 04/03/18 0845  GLUCAP 131* 157* 168* 153* 127*   Lipid Profile: Recent Labs    04/02/18 0445  CHOL 131  HDL 24*  LDLCALC 88  TRIG 95  CHOLHDL 5.5   Thyroid Function Tests: Recent Labs    04/01/18 1758  TSH 1.999   Anemia Panel: No results for input(s): VITAMINB12, FOLATE, FERRITIN, TIBC, IRON, RETICCTPCT in the last 72 hours. Sepsis Labs: No results for input(s): PROCALCITON,  LATICACIDVEN in the last 168 hours.  No results found for this or any previous visit (from the past 240 hour(s)).       Radiology Studies: No results found.      Scheduled Meds: . aspirin EC  81 mg Oral Daily  . enoxaparin (LOVENOX) injection  40 mg Subcutaneous Q24H  . folic acid  1 mg Oral Daily  . furosemide  40 mg Intravenous Q12H  . insulin aspart  0-15 Units Subcutaneous TID WC  . insulin aspart  0-5 Units Subcutaneous QHS  . LORazepam  0-4 mg Intravenous Q6H   Followed by  . [START ON 04/04/2018] LORazepam  0-4 mg Intravenous Q12H  . mouth rinse  15 mL Mouth Rinse BID  . multivitamin with minerals  1 tablet Oral Daily  . nicotine  14 mg Transdermal Daily  . sodium chloride flush  3 mL Intravenous Q12H  . spironolactone  25 mg Oral Daily  . thiamine  100 mg Oral Daily   Or  . thiamine  100 mg Intravenous Daily   Continuous Infusions: . sodium chloride       LOS: 2 days     Alwyn RenElizabeth G Mathews, MD [facility] If 7PM-7AM, please contact night-coverage [email or web address] Password [email or web address] 04/03/2018, 10:25 AM

## 2018-04-03 NOTE — Progress Notes (Signed)
Heart Failure Navigator Consult Note  Presentation: Per Dr. Rosemary Holms: [name] is a 47 year old Caucasian male admitted with shortness of breath.  The patient has experienced worsening exertional dyspnea, now with minimal walking up 200 yards, bilateral leg swelling. The symptoms have been ongoing for several weeks, they have been particularly worse over the last 2 weeks. He denies any chest pain. He does have orthopnea, no paroxysmal nocturnal dyspnea. He denies any significant family history of heart disease. He does snore at night, although never been officially diagnosed with sleep apnea.  Patient has significant tobacco (24-pack-year) and alcohol abuse history (6 shots of Patron, and sometime beers, everyday for last 6-7 years, on weekends prior to that). He works at a [employer] 6 PM to 6 AM every day, during which he drinks everyday. Patient has been on alcohol withdrawal protocol while in the hospital.  Workup so far shows EKG with biatrial enlargement, echocardiogram with severe biventricular failure with grade 3 diastolic dysfunction and elevated left atrial pressure. Mild to moderate tricuspid regurgitation with pulmonary hypertension estimated PA 50 mmHg. NSVT seen on monitor.  Past Medical History:  Diagnosis Date  . Diverticulitis     Social History   Socioeconomic History  . Marital status: Single    Spouse name: Not on file  . Number of children: Not on file  . Years of education: Not on file  . Highest education level: Not on file  Occupational History  . Not on file  Social Needs  . Financial resource strain: Not on file  . Food insecurity:    Worry: Not on file    Inability: Not on file  . Transportation needs:    Medical: Not on file    Non-medical: Not on file  Tobacco Use  . Smoking status: Current Every Day Smoker    Packs/day: 0.75    Years: 32.00    Pack years: 24.00  . Smokeless tobacco: Never Used  Substance and Sexual Activity  .  Alcohol use: Yes    Comment: most days, a few shots and a beer  . Drug use: Yes    Types: Marijuana    Comment: marijuana 1-2 times per week; reluctantly admits to cocaine with last use maybe 1 month ago  . Sexual activity: Not on file  Lifestyle  . Physical activity:    Days per week: Not on file    Minutes per session: Not on file  . Stress: Not on file  Relationships  . Social connections:    Talks on phone: Not on file    Gets together: Not on file    Attends religious service: Not on file    Active member of club or organization: Not on file    Attends meetings of clubs or organizations: Not on file    Relationship status: Not on file  Other Topics Concern  . Not on file  Social History Narrative  . Not on file    ECHO:Study Conclusions--04/02/18  - Left ventricle: The cavity size was moderately dilated. Wall   thickness was normal. Systolic function was severely reduced. The   estimated ejection fraction was in the range of 15% to 20%. There   is severe global hypokinesis with no regional differences in the   wall motion. Doppler parameters are consistent with a reversible   restrictive pattern, indicative of decreased left ventricular   diastolic compliance and/or increased left atrial pressure (grade   3 diastolic dysfunction). - Right ventricle: The cavity  size was mildly dilated. Systolic   function was moderately reduced. - Right atrium: The atrium was mildly dilated. - Tricuspid valve: Mildly dilated annulus. There was mild-moderate   regurgitation.  Impressions:  - Dilated cardiomyopathy with severe biventricular failure. Visual   LVEF 15-20%.   Mild to mod tricuspid regurgitation with elevated central venous   pressure.   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  BNP    Component Value Date/Time   BNP 814.4 (H) 04/01/2018 0908    ProBNP No results found for: PROBNP   Education Assessment and Provision:  Detailed  education and instructions provided on heart failure disease management including the following:  Signs and symptoms of Heart Failure When to call the physician Importance of daily weights Low sodium diet Fluid restriction Medication management Anticipated future follow-up appointments  Patient education given on each of the above topics.  Patient acknowledges understanding and acceptance of all instructions.  I spoke with [name] regarding his HF and current admission.  He asks me if his "heart can get stronger".  Of note he is SOB lying in bed during our conversation.  We discussed the importance of daily weights and when to contact the physician.  I reviewed a low sodium diet as well high sodium foods to avoid. He acknowledges that food choices will be a struggle for him--he tells me that he does most of the cooking in his house.   I encouraged abstinence from recreational drug use and alcohol use.  He tells me that he has insurance however it is not noted on the chart-he does say that he does not anticipate any issues getting or taking prescribed medications.  He will likely follow with Dr. Rosemary Holms after discharge.   Education Materials:  "Living Better With Heart Failure" Booklet, Daily Weight Tracker Tool    High Risk Criteria for Readmission and/or Poor Patient Outcomes:   EF <30%- Yes 15-30%  2 or more admissions in 6 months- No new HF  Difficult social situation- Yes -substance abuse and ETOH--tells me he has insurance however none noted.  Lives with girlfriend in [place].  Demonstrates medication noncompliance- took no medications prior to admission  Barriers of Care:  Knowledge, compliance and ? financial   Discharge Planning:   Plans to return to home with girlfriend in [place]

## 2018-04-03 NOTE — Clinical Social Work Note (Signed)
Clinical Social Work Assessment  Patient Details  Name: [name] MRN: [id number] Date of Birth: 05-22-1971  Date of referral:  04/03/18               Reason for consult:  Substance Use/ETOH Abuse                Permission sought to share information with:    Permission granted to share information::  No  Name::        Agency::     Relationship::     Contact Information:     Housing/Transportation Living arrangements for the past 2 months:  Single Family Home Source of Information:  Patient, Medical Team Patient Interpreter Needed:  None Criminal Activity/Legal Involvement Pertinent to Current Situation/Hospitalization:  No - Comment as needed Significant Relationships:  Parents, Significant Other Lives with:  Significant Other Do you feel safe going back to the place where you live?  Yes Need for family participation in patient care:  Yes (Comment)  Care giving concerns:  Substance abuse.  Social Worker assessment / plan:  CSW met with patient. No supports at bedside. CSW introduced role and inquired about interest in substance abuse resources. Patient began to laugh and stated he had been in rehab three times. His last time in rehab was 8-9 years ago. Patient stated his current medical issues would likely cause him to stop using. Patient states that alcohol is his "drug of choice" but he does sometimes smoke marijuana. Patient reports drinking daily. Patient agreeable to receiving resources stating it will give him "something to read." No further concerns. CSW signing off as social work intervention is no longer needed.  Employment status:  [employer] information:  Other (Comment Required)(Unknown. Girlfriend states he does have insurance coverage.) PT Recommendations:  Not assessed at this time Information / Referral to community resources:  Residential Substance Abuse Treatment Options, Outpatient Substance Abuse Treatment Options  Patient/Family's Response to  care:  Patient agreeable to receiving resources. Patient's girlfriend supportive and involved in patient's care. Patient appreciated social work intervention.  Patient/Family's Understanding of and Emotional Response to Diagnosis, Current Treatment, and Prognosis:  Patient has a good understanding of the reason for admission and social work consult. Patient appears happy with hospital care.  Emotional Assessment Appearance:  Appears stated age Attitude/Demeanor/Rapport:  Engaged Affect (typically observed):  Accepting, Appropriate, Calm, Pleasant Orientation:  Oriented to Self, Oriented to Place, Oriented to  Time, Oriented to Situation Alcohol / Substance use:  Alcohol Use, Illicit Drugs, Tobacco Use Psych involvement (Current and /or in the community):  No (Comment)  Discharge Needs  Concerns to be addressed:  Care Coordination Readmission within the last 30 days:  No Current discharge risk:  Substance Abuse Barriers to Discharge:  Continued Medical Work up   Candie Chroman, LCSW 04/03/2018, 12:22 PM

## 2018-04-03 NOTE — Progress Notes (Addendum)
Spoke with patient about the new diagnosis of diabetes. States that he does not have any in the family. Has never been told that he has prediabetes. States that he does not have a PCP.   Explained the HgbA1C of 6.9% and connection with blood sugar.  Will need dietician to talk with him about meal planning, can watch DM videos #501-510, will need to purchase [facility] [employer] home blood glucose meter, strips, and lancets or get it from his PCP clinic. Not able to talk with patient at length since he was sleeping off and on during conversation.  Will continue to follow blood sugars while in the hospital.   Smith Mince RN BSN CDE Diabetes Coordinator Pager: [phone]  8am-5pm

## 2018-04-03 NOTE — Progress Notes (Signed)
Subjective:  Wearing CPAP, says it is uncomfortable.   Objective:  Vital Signs in the last 24 hours: Temp:  [97.5 F (36.4 C)-98.7 F (37.1 C)] 97.5 F (36.4 C) (05/31 0409) Pulse Rate:  [91-106] 100 (05/31 0409) Resp:  [20] 20 (05/31 0409) BP: (117-141)/(81-103) 125/101 (05/31 0409) SpO2:  [93 %-99 %] 93 % (05/31 0409) Weight:  [135.8 kg (299 lb 4.8 oz)] 135.8 kg (299 lb 4.8 oz) (05/31 0409)  Intake/Output from previous day: 05/30 0701 - 05/31 0700 In: 820 [P.O.:720; IV Piggyback:100] Out: 4675 [Urine:4675] Intake/Output from this shift: No intake/output data recorded.  Physical Exam: Nursing note and vitals reviewed. Constitutional: He is oriented to person, place, and time. No distress.  Morbidly obese.  Cardiovascular: Regular rhythm. Exam reveals gallop.  No murmur heard. Respiratory: Effort normal. No respiratory distress. He has no wheezes. He has rales.  No snoring or apnea noted while wearing nasal CPAP GI: Soft. Bowel sounds are normal. He exhibits distension. There is no tenderness.  Musculoskeletal: He exhibits edema (1+ Rt, 2+ Lt).  Neurological: He is alert and oriented to person, place, and time. No cranial nerve deficit.  Skin: Skin is warm and dry.  Psychiatric: He has a normal mood and affect.      Lab Results: Recent Labs    04/02/18 1758 04/03/18 0417  WBC 8.0 7.1  HGB 14.1 13.5  PLT 236 233   Recent Labs    04/02/18 0445 04/03/18 0417  NA 138 137  K 4.5 4.5  CL 96* 96*  CO2 30 34*  GLUCOSE 109* 111*  BUN 10 16  CREATININE 0.86 0.78   No results for input(s): TROPONINI in the last 72 hours.  Invalid input(s): CK, MB Hepatic Function Panel Recent Labs    04/03/18 0417  PROT 6.7  ALBUMIN 2.8*  AST 25  ALT 24  ALKPHOS 93  BILITOT 1.2   Recent Labs    04/02/18 0445  CHOL 131   EKG 04/02/2018: Sinus tachycardia 107 bpm. Biatrial enlargement. Normal axis, normal conduction. Possible old anteroseptal infarct. No ischemic  changes.  Echocardiogram 04/02/2018: Study Conclusions  - Left ventricle: The cavity size was moderately dilated. Wall thickness was normal. Systolic function was severely reduced. The estimated ejection fraction was in the range of 15% to 20%. There is severe global hypokinesis with no regional differences in the wall motion. Doppler parameters are consistent with a reversible restrictive pattern, indicative of decreased left ventricular diastolic compliance and/or increased left atrial pressure (grade 3 diastolic dysfunction). - Right ventricle: The cavity size was mildly dilated. Systolic function was moderately reduced. - Right atrium: The atrium was mildly dilated. - Tricuspid valve: Mildly dilated annulus. There was mild-moderate regurgitation.  Impressions:  - Dilated cardiomyopathy with severe biventricular failure. Visual LVEF 15-20%. Mild to mod tricuspid regurgitation with elevated central venous pressure. 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 However, he does have risk factors for CAD and will need ischemic evaluation. 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 Excellent urine output.  Continue spironolactone 25 mg daily. Will start Entresto on 04/04/2018. Will add beta blocker after he tolerates ARNI and ARB well. If patient gets discharged over the weekend, will arrange for outpatient right and left heart cath, coronary angiography, If still  inpatient, will consider cath on Monday or Tuesday. Continue CPAP use.  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 He should wear  CPAP whenever he sleeps. Will need formal outpatient sleep study. Continue management of alcohol withdrawal as you're doing.    LOS: 2 days    Manish J Patwardhan 04/03/2018, 8:34 AM  Manish Emiliano DyerJ Patwardhan, MD [facility] Pager: [phone] Office: [phone] If no answer Cell [phone]

## 2018-04-04 LAB — BASIC METABOLIC PANEL
Anion gap: 10 (ref 5–15)
BUN: 14 mg/dL (ref 6–20)
CHLORIDE: 94 mmol/L — AB (ref 101–111)
CO2: 35 mmol/L — AB (ref 22–32)
CREATININE: 0.82 mg/dL (ref 0.61–1.24)
Calcium: 8.7 mg/dL — ABNORMAL LOW (ref 8.9–10.3)
GFR calc non Af Amer: >60 mL/min (ref >60)
Glucose, Bld: 98 mg/dL (ref 65–99)
POTASSIUM: 4.3 mmol/L (ref 3.5–5.1)
Sodium: 139 mmol/L (ref 135–145)

## 2018-04-04 LAB — GLUCOSE, CAPILLARY
GLUCOSE-CAPILLARY: 91 mg/dL (ref 65–99)
GLUCOSE-CAPILLARY: 182 mg/dL — AB (ref 65–99)
GLUCOSE-CAPILLARY: 161 mg/dL — AB (ref 65–99)
Glucose-Capillary: 137 mg/dL — ABNORMAL HIGH (ref 65–99)

## 2018-04-04 LAB — MAGNESIUM: Magnesium: 1.2 mg/dL — ABNORMAL LOW (ref 1.7–2.4)

## 2018-04-04 MED ORDER — MAGNESIUM SULFATE 4 GM/100ML IV SOLN
4.0000 g | Freq: Once | INTRAVENOUS | Status: AC
  Administered 2018-04-04: 4 g via INTRAVENOUS
  Filled 2018-04-04: qty 100

## 2018-04-04 MED ORDER — SACUBITRIL-VALSARTAN 49-51 MG PO TABS: 1.0000 | ORAL_TABLET | Freq: Two times a day (BID) | ORAL | Status: DC

## 2018-04-04 MED ORDER — ALPRAZOLAM 0.5 MG PO TABS
0.5000 mg | ORAL_TABLET | Freq: Three times a day (TID) | ORAL | Status: DC | PRN
  Administered 2018-04-04 – 2018-04-06 (×3): 0.5 mg via ORAL
  Filled 2018-04-04 (×3): qty 1

## 2018-04-04 MED ORDER — SACUBITRIL-VALSARTAN 24-26 MG PO TABS
1.0000 | ORAL_TABLET | Freq: Two times a day (BID) | ORAL | Status: DC
  Administered 2018-04-04 – 2018-04-07 (×8): 1 via ORAL
  Filled 2018-04-04 (×10): qty 1

## 2018-04-04 NOTE — Plan of Care (Signed)
Nutrition Education Note  RD consulted for nutrition education regarding new diagnosis of diabetes in setting of CHF  Lab Results  Component Value Date   HGBA1C 6.9 (H) 04/01/2018   Patient asleep on RD arrival. Pt states he still "doesn't believe it" (regarding new dx), but was agreeable to education.  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 He says he works 6pm to [employer] and sleeps from about 8am to 4 pm.   His diet recall was notable for a high frequency of eating out, regularly eating dense grains, and habitually eating 4 oz of blueberries of each morning. He is an avid Geologist, engineering. Beverage wise, he denies drinking any soda/juice. He drinks [place] green tea mostly. He also drinks a large amount of beer. Physical Activity wise, he says he gets exercise from house chores, such as mowing grass.   RD first discussed beverages. RD discussed how sugary beverages are the quickest way to increase BG, recommended only SF beverages. Unsure which [place] Tea he is consuming, but recommended choosing the carb free versions. Other appropriate choices are diet soda, flavored water, water w/ mio/crystal light, unsweet tea/coffee etc. RD also educated that beer contains carbs. He asks if liquor would be better. RD noted that while liquor would not affect his BG as much, etoh is hard on the body in other ways and I wouldn't condone drinking liquor to manage his sugars better. If he is to drink Beer, recommended low carb light beers.   He eats "on the go" due to his job. RD discussed the pitfalls of eating out ie larger than needed portions, excess sodium/carbs, and lack of nutrition info. He says he cooks frequently. RD recommended that he start cooking more often and bringing meals from home to work. He was agreeable. Gave goal to eat out <3x/wk  He eats fruit (blueberries) often and does not seem to be aware high fruit intake can increase sugars. RD reviewed  carb sources. Listed starchy vegetables. RD recommended he eat high carb containing items as part of a meal as opposed to on their own. He should "balance" his meals. RD showed copy of the diabetic "My Plate".   While RD gave general recs to avoid the starchy vegetables and limit his intake of rice/pasta, he can alternatively read labels and use his "carbohydrate budget" as he sees fit. RD recommended 60-75g carb/meal and 15-30g/snack.   Given pt has heart failure, diabetic diet ed was tailored and we also reviewed some basics of a HF diet such as adding salt to foods. He adds salt to foods now and RD explained how 1 tbsp 2300 mg/sodium. He still wants to add pinches of salt to items.   RD provided "Type 2 Diabetes Nutrition Therapy" and "Diabetes Label Reading TIps" handouts from the Academy of Nutrition and Dietetics as well as  a copy of a Diabetic "My Plate".   Summary of Goals Avoid potatoes/corn Eat fruit as part of meal  Eat out <3x a wk.  Eat rice/pasta </= 1-2x a week Choose whole grains/breads when eating out 60-75g Carb/meal 15-30/snack Eat <2000g sodium/day Try to follow a high protein, lower-consistent carb, low sodium diet  Expect FAIR compliance. Patient did ask some appropriate questions, indicating interest, but he also spent a fair amount of time joking around. He seems motivated, but he is limited by his altered sleep/eating pattern secondary to his occupation.   Body mass index is 39.36  kg/m. Pt meets criteria for Obese based on current BMI.  No further nutrition interventions warranted at this time. If additional nutrition issues arise, please re-consult RD.  Christophe LouisNathan Hampton Cost RD, LDN, CNSC Clinical Nutrition Available Tues-Sat via Pager: [phone] 04/04/2018 2:59 PM

## 2018-04-04 NOTE — Progress Notes (Signed)
Subjective:  Breathing marginally improved.  Objective:  Vital Signs in the last 24 hours: Temp:  [97 F (36.1 C)-98.4 F (36.9 C)] 98.2 F (36.8 C) (06/01 0344) Pulse Rate:  [101-105] 101 (06/01 0344) Resp:  [18-22] 18 (06/01 0344) BP: (131-148)/(89-101) 131/101 (06/01 0344) SpO2:  [96 %-100 %] 100 % (06/01 0344) Weight:  [131.6 kg (290 lb 3.2 oz)] 131.6 kg (290 lb 3.2 oz) (06/01 0344)  Intake/Output from previous day: 05/31 0701 - 06/01 0700 In: 700 [P.O.:700] Out: 3500 [Urine:3500] Intake/Output from this shift: Total I/O In: 243 [P.O.:240; I.V.:3] Out: 1400 [Urine:1400]  Physical Exam: Nursing note and vitals reviewed. Constitutional: He is oriented to person, place, and time. No distress.  Morbidly obese.  Cardiovascular: Regular rhythm, tachycardia. Exam reveals gallop.  No murmur heard. Respiratory: Effort normal. No respiratory distress. He has no wheezes. He has no rales.  GI: Soft. Bowel sounds are normal. He exhibits distension. There is no tenderness.  Musculoskeletal: He exhibits edema (1+ Rt, 2+ Lt).  Neurological: He is alert and oriented to person, place, and time. No cranial nerve deficit.  Skin: Skin is warm and dry.  Psychiatric: He has a normal mood and affect.      Lab Results: Recent Labs    04/02/18 1758 04/03/18 0417  WBC 8.0 7.1  HGB 14.1 13.5  PLT 236 233   Recent Labs    04/03/18 0417 04/04/18 0714  NA 137 139  K 4.5 4.3  CL 96* 94*  CO2 34* 35*  GLUCOSE 111* 98  BUN 16 14  CREATININE 0.78 0.82   No results for input(s): TROPONINI in the last 72 hours.  Invalid input(s): CK, MB Hepatic Function Panel Recent Labs    04/03/18 0417  PROT 6.7  ALBUMIN 2.8*  AST 25  ALT 24  ALKPHOS 93  BILITOT 1.2   Recent Labs    04/02/18 0445  CHOL 131   EKG 04/02/2018: Sinus tachycardia 107 bpm. Biatrial enlargement. Normal axis, normal conduction. Possible old anteroseptal infarct. No ischemic changes.  Echocardiogram  04/02/2018: Study Conclusions  - Left ventricle: The cavity size was moderately dilated. Wall thickness was normal. Systolic function was severely reduced. The estimated ejection fraction was in the range of 15% to 20%. There is severe global hypokinesis with no regional differences in the wall motion. Doppler parameters are consistent with a reversible restrictive pattern, indicative of decreased left ventricular diastolic compliance and/or increased left atrial pressure (grade 3 diastolic dysfunction). - Right ventricle: The cavity size was mildly dilated. Systolic function was moderately reduced. - Right atrium: The atrium was mildly dilated. - Tricuspid valve: Mildly dilated annulus. There was mild-moderate regurgitation.  Impressions:  - Dilated cardiomyopathy with severe biventricular failure. Visual LVEF 15-20%. Mild to mod tricuspid regurgitation with elevated central venous pressure. 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 However, he does have risk factors for CAD and will need ischemic evaluation. 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 Continue IV lasix 40 mg bid. Excellent urine output.  Continue spironolactone 25 mg daily. Will add beta blocker on 04/05/2018. If patient gets discharged over the weekend, will arrange for outpatient right and left heart cath, coronary angiography, If still inpatient, will consider cath on Monday or Tuesday. Continue CPAP use.  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 He should wear CPAP whenever he sleeps. Will need formal outpatient sleep  study. Continue management of alcohol withdrawal as you're doing.    LOS: 3 days    Colan Laymon J Lakrisha Iseman 04/04/2018, 10:36 AM  Charnae Lill Emiliano DyerJ Goldman Birchall, MD [facility] Pager: [phone] Office: [phone] If no answer Cell [phone]

## 2018-04-04 NOTE — Progress Notes (Addendum)
PROGRESS NOTE    Francisco SalisburyMatthew A Strollo  QIO:[id number] DOB: [birth year] DOA: 04/01/2018 PCP: Patient, No Pcp Per Brief Narrative:  46 y.o.malewithno significant pastmedical historypresenting withSOB. 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 He has noticed SOB. It kept getting progressively worse. He was struggling for air and decided to come to the ER. Symptoms started about 2 weeks ago. He has had progressive edema of his feet and legs as well as his abdomen for that same period of time. +orthopnea. +PND. +cough productive of "darker, like brown" sputum. Denies chest pain. His right knee has been hurting with swelling, hurts to walk.  ED Course:New CHF. Edema to proximal thighs. Chest pressure with orthopnea. Symptoms for a few weeks. Tachypnea and mild tachycardia. Pleural effusion on right, interstitial edema. Elevated BNP. Negative troponin.    Assessment & Plan:   Principal Problem:   CHF exacerbation (HCC) Active Problems:   Polysubstance abuse (HCC)   Tobacco dependence   Elevated blood pressure reading   Hyperglycemia  1] new onset CHF- patient with dilated cardiomyopathy and severe biventricular failure with ejection fraction 15 to 20%.  Patient reports that he is feeling better after diuresis.   he did use his CPAP last night but was very uncomfortable.  He is negative by 4.4 lit since admission.  His weight is come down from 304 pounds at the time of admission to 290 today.  He still has a lot of edema and fluid buildup.  Continue Lasix 40 every 12 along with spironolactone 25 mg daily.Entresto being started today.beta blocker to start tomorrow. 2] hypertension his blood pressure is better than yesterday.  Continue Lasix and Aldactone.  3] undiagnosed type 2 diabetes-continue SSI for now  4] hypomagnesemia replete   5] polysubstance abuse/alcohol abuse/tobacco abuse-patient reports that he does use  marijuana plus alcohol on a daily basis.  Discussed with him the importance of stopping alcohol tobacco and illegal drugs.  6]Un diagnosed sleep apnea patient will need sleep test as an outpatient      DVT prophylaxis:lovenox Code Status:full Family Communication:none Disposition Plan:tbd Consultants: cardiology  Procedures:none Antimicrobials: none Subjective: Feels okay..was anxious earlier..  Objective: Vitals:   04/03/18 1220 04/03/18 1920 04/04/18 0344 04/04/18 1209  BP: 132/89 (!) 148/101 (!) 131/101 132/90  Pulse: (!) 101 (!) 105 (!) 101 (!) 104  Resp: (!) 22 20 18 20   Temp: 98.4 F (36.9 C) (!) 97 F (36.1 C) 98.2 F (36.8 C) 98.3 F (36.8 C)  TempSrc: Oral  Oral Oral  SpO2: 99% 96% 100% 94%  Weight:   131.6 kg (290 lb 3.2 oz)   Height:        Intake/Output Summary (Last 24 hours) at 04/04/2018 1501 Last data filed at 04/04/2018 1451 Gross per 24 hour  Intake 483 ml  Output 4925 ml  Net -4442 ml   Filed Weights   04/02/18 0438 04/03/18 0409 04/04/18 0344  Weight: (!) 138 kg (304 lb 3.2 oz) 135.8 kg (299 lb 4.8 oz) 131.6 kg (290 lb 3.2 oz)    Examination:  General exam: Appears calm and comfortable  Respiratory system: Clear to auscultation. Respiratory effort normal. Cardiovascular system: carckles b/l basesheard, RRR. No JVD, murmurs, rubs, gallops or clicks. No pedal edema. Gastrointestinal system: Abdomen is nondistended, soft and nontender. No organomegaly or masses felt. Normal bowel sounds heard. Central nervous system: Alert and oriented. No focal neurological deficits. Extremities:  2 plus edema. Skin: No rashes,  lesions or ulcers Psychiatry: Judgement and insight appear normal. Mood & affect appropriate.     Data Reviewed: I have personally reviewed following labs and imaging studies  CBC: Recent Labs  Lab 04/01/18 0816 04/02/18 1758 04/03/18 0417  WBC 7.8 8.0 7.1  NEUTROABS  --  5.4 4.3  HGB 14.2 14.1 13.5  HCT 45.9 46.6 46.0    MCV 90.7 92.5 93.5  PLT 231 236 233   Basic Metabolic Panel: Recent Labs  Lab 04/01/18 0816 04/02/18 0445 04/03/18 0417 04/04/18 0714  NA 134* 138 137 139  K 4.0 4.5 4.5 4.3  CL 102 96* 96* 94*  CO2 24 30 34* 35*  GLUCOSE 154* 109* 111* 98  BUN 10 10 16 14   CREATININE 0.67 0.86 0.78 0.82  CALCIUM 8.5* 8.8* 8.4* 8.7*  MG 1.3* 1.2*  --  1.2*   GFR: Estimated Creatinine Clearance: 156.3 mL/min (by C-G formula based on SCr of 0.82 mg/dL). Liver Function Tests: Recent Labs  Lab 04/03/18 0417  AST 25  ALT 24  ALKPHOS 93  BILITOT 1.2  PROT 6.7  ALBUMIN 2.8*   No results for input(s): LIPASE, AMYLASE in the last 168 hours. No results for input(s): AMMONIA in the last 168 hours. Coagulation Profile: No results for input(s): INR, PROTIME in the last 168 hours. Cardiac Enzymes: No results for input(s): CKTOTAL, CKMB, CKMBINDEX, TROPONINI in the last 168 hours. BNP (last 3 results) No results for input(s): PROBNP in the last 8760 hours. HbA1C: Recent Labs    04/01/18 1758  HGBA1C 6.9*   CBG: Recent Labs  Lab 04/03/18 1220 04/03/18 1619 04/03/18 2112 04/04/18 0744 04/04/18 1120  GLUCAP 136* 131* 132* 91 182*   Lipid Profile: Recent Labs    04/02/18 0445  CHOL 131  HDL 24*  LDLCALC 88  TRIG 95  CHOLHDL 5.5   Thyroid Function Tests: Recent Labs    04/01/18 1758  TSH 1.999   Anemia Panel: No results for input(s): VITAMINB12, FOLATE, FERRITIN, TIBC, IRON, RETICCTPCT in the last 72 hours. Sepsis Labs: No results for input(s): PROCALCITON, LATICACIDVEN in the last 168 hours.  No results found for this or any previous visit (from the past 240 hour(s)).       Radiology Studies: No results found.      Scheduled Meds: . aspirin EC  81 mg Oral Daily  . enoxaparin (LOVENOX) injection  40 mg Subcutaneous Q24H  . folic acid  1 mg Oral Daily  . furosemide  40 mg Intravenous Q12H  . insulin aspart  0-15 Units Subcutaneous TID WC  . insulin aspart   0-5 Units Subcutaneous QHS  . LORazepam  0-4 mg Intravenous Q12H  . mouth rinse  15 mL Mouth Rinse BID  . multivitamin with minerals  1 tablet Oral Daily  . nicotine  14 mg Transdermal Daily  . sacubitril-valsartan  1 tablet Oral BID  . sodium chloride flush  3 mL Intravenous Q12H  . spironolactone  25 mg Oral Daily  . thiamine  100 mg Oral Daily   Or  . thiamine  100 mg Intravenous Daily   Continuous Infusions: . sodium chloride       LOS: 3 days      Alwyn Ren, MD [facility]  If 7PM-7AM, please contact night-coverage [email or web address] Password [email or web address] 04/04/2018, 3:01 PM

## 2018-04-04 NOTE — Progress Notes (Signed)
Patient states he is having some anxiety, sweating, headache, and nausea.  He thinks he is still having some withdrawal symptoms and is asking for anxiety medicine.  Currently not due, and no prn available.  CIWA score 9.  Dr. Jerolyn CenterMathews paged and made aware.  New orders received, will continue to monitor.

## 2018-04-04 NOTE — Progress Notes (Deleted)
Patient's heart rate will sometimes go into the 120s-130s when coughing or out of bed.  Patient asymptomatic.  HR sustaining 70s-80s.  Will continue to monitor.

## 2018-04-05 LAB — GLUCOSE, CAPILLARY
GLUCOSE-CAPILLARY: 102 mg/dL — AB (ref 65–99)
GLUCOSE-CAPILLARY: 174 mg/dL — AB (ref 65–99)
GLUCOSE-CAPILLARY: 159 mg/dL — AB (ref 65–99)
Glucose-Capillary: 191 mg/dL — ABNORMAL HIGH (ref 65–99)

## 2018-04-05 LAB — BASIC METABOLIC PANEL
ANION GAP: 11 (ref 5–15)
BUN: 7 mg/dL (ref 6–20)
CALCIUM: 8.6 mg/dL — AB (ref 8.9–10.3)
CHLORIDE: 88 mmol/L — AB (ref 101–111)
CO2: 38 mmol/L — AB (ref 22–32)
Creatinine, Ser: 0.72 mg/dL (ref 0.61–1.24)
GFR calc Af Amer: >60 mL/min (ref >60)
GFR calc non Af Amer: >60 mL/min (ref >60)
GLUCOSE: 142 mg/dL — AB (ref 65–99)
POTASSIUM: 3.7 mmol/L (ref 3.5–5.1)
Sodium: 137 mmol/L (ref 135–145)

## 2018-04-05 MED ORDER — METOPROLOL SUCCINATE ER 25 MG PO TB24
25.0000 mg | ORAL_TABLET | Freq: Every day | ORAL | Status: DC
  Administered 2018-04-05 – 2018-04-08 (×4): 25 mg via ORAL
  Filled 2018-04-05 (×4): qty 1

## 2018-04-05 MED ORDER — MAGNESIUM SULFATE 4 GM/100ML IV SOLN
4.0000 g | Freq: Once | INTRAVENOUS | Status: AC
  Administered 2018-04-05: 4 g via INTRAVENOUS
  Filled 2018-04-05: qty 100

## 2018-04-05 NOTE — Progress Notes (Signed)
Subjective:  Breathing marginally improved.  Objective:  Vital Signs in the last 24 hours: Temp:  [97.8 F (36.6 C)-98.2 F (36.8 C)] 98.2 F (36.8 C) (06/02 0605) Pulse Rate:  [101-107] 107 (06/02 0605) Resp:  [18-20] 18 (06/02 0605) BP: (135-140)/(91-92) 135/92 (06/02 0605) SpO2:  [96 %-97 %] 96 % (06/02 0605) Weight:  [124.4 kg (274 lb 3.2 oz)] 124.4 kg (274 lb 3.2 oz) (06/02 0605)  Intake/Output from previous day: 06/01 0701 - 06/02 0700 In: 1063 [P.O.:960; I.V.:3; IV Piggyback:100] Out: 8600 [Urine:8600] Intake/Output from this shift: Total I/O In: 723 [P.O.:720; I.V.:3] Out: 300 [Urine:300]   Net -15L for this admission  Physical Exam: Nursing note and vitals reviewed. Constitutional: He is oriented to person, place, and time. No distress.  Morbidly obese.  Cardiovascular: Regular rhythm, tachycardia. Exam reveals gallop.  No murmur heard. Respiratory: Effort normal. No respiratory distress. He has no wheezes. He has no rales.  GI: Soft. Bowel sounds are normal. He exhibits distension. There is no tenderness.  Musculoskeletal: He exhibits edema (1+ Rt, 2+ Lt).  Neurological: He is alert and oriented to person, place, and time. No cranial nerve deficit.  Skin: Skin is warm and dry.  Psychiatric: He has a normal mood and affect.      Lab Results: Recent Labs    04/02/18 1758 04/03/18 0417  WBC 8.0 7.1  HGB 14.1 13.5  PLT 236 233   Recent Labs    04/04/18 0714 04/05/18 0848  NA 139 137  K 4.3 3.7  CL 94* 88*  CO2 35* 38*  GLUCOSE 98 142*  BUN 14 7  CREATININE 0.82 0.72   No results for input(s): TROPONINI in the last 72 hours.  Invalid input(s): CK, MB Hepatic Function Panel Recent Labs    04/03/18 0417  PROT 6.7  ALBUMIN 2.8*  AST 25  ALT 24  ALKPHOS 93  BILITOT 1.2   No results for input(s): CHOL in the last 72 hours. EKG 04/02/2018: Sinus tachycardia 107 bpm. Biatrial enlargement. Normal axis, normal conduction. Possible old  anteroseptal infarct. No ischemic changes.  Echocardiogram 04/02/2018: Study Conclusions  - Left ventricle: The cavity size was moderately dilated. Wall thickness was normal. Systolic function was severely reduced. The estimated ejection fraction was in the range of 15% to 20%. There is severe global hypokinesis with no regional differences in the wall motion. Doppler parameters are consistent with a reversible restrictive pattern, indicative of decreased left ventricular diastolic compliance and/or increased left atrial pressure (grade 3 diastolic dysfunction). - Right ventricle: The cavity size was mildly dilated. Systolic function was moderately reduced. - Right atrium: The atrium was mildly dilated. - Tricuspid valve: Mildly dilated annulus. There was mild-moderate regurgitation.  Impressions:  - Dilated cardiomyopathy with severe biventricular failure. Visual LVEF 15-20%. Mild to mod tricuspid regurgitation with elevated central venous pressure. 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 However, he does have risk factors for CAD and will need ischemic evaluation. Currently NYHA class III-IV. Remarkable urine output this admission, but still has at least 5 L more to diurese. Alcohol abuse Tobacco abuse Suspected severe obstructive sleep apnea Morbid obesity  Recommendations: Add metoprolol succinate 25 mg daily. Continue IV lasix 40 mg bid. Excellent urine output.  Continue Entresto 24-26 mg bid. Continue spironolactone 25 mg daily. Plan on performing right and left heart cath on Tuesday  06/04 Continue CPAP use.  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 He should wear CPAP whenever he sleeps. Will  need formal outpatient sleep study. Continue management of alcohol withdrawal as you're doing. Increase activity as tolerated.    LOS: 4 days    Chenelle Benning J Anetta Olvera 04/05/2018, 12:32 PM  Jeter Tomey Emiliano Dyer, MD [facility] Pager: [phone] Office: [phone] If no answer Cell [phone]

## 2018-04-05 NOTE — Plan of Care (Signed)
Acute PT goals established for safe D/c recommendation to home.

## 2018-04-05 NOTE — Evaluation (Signed)
Physical Therapy Evaluation Patient Details Name: [name] MRN: [id number] DOB: 05/16/1971 Today's Date: 04/05/2018   History of Present Illness  47 y.o. 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  Clinical Impression  Pt admitted with above diagnosis. Pt currently with functional limitations due to the deficits listed below (see PT Problem List). SpO2 96% on 2L supplemental O2, at rest with HR 109.  Ambulatory Sats 90% on room air mild DOE, HR to 120. SpO2 on room air at rest 92% HR, 107. Seems to be progressing back to baseline per pt reports. Feels 50% improved. Will continue to follow to monitor for any changes in functional status however I anticipate he will progress functionally as he improves medically. Pt will benefit from skilled PT to increase their independence and safety with mobility to allow discharge to the venue listed below.       Follow Up Recommendations No PT follow up    Equipment Recommendations  None recommended by PT    Recommendations for Other Services       Precautions / Restrictions Precautions Precautions: None Precaution Comments: Monitor HR and O2 Restrictions Weight Bearing Restrictions: No      Mobility  Bed Mobility Overal bed mobility: Independent                Transfers Overall transfer level: Modified independent               General transfer comment: extra time, no physical assist needed  Ambulation/Gait Ambulation/Gait assistance: Supervision Ambulation Distance (Feet): 150 Feet Assistive device: IV Pole Gait Pattern/deviations: Step-through pattern;Wide base of support;Antalgic Gait velocity: decreased Gait velocity interpretation: 1.31 - 2.62 ft/sec, indicative of limited community ambulator General Gait Details: Slightly decreased gait speed, mild antalgic pattern, with wide BOS. Mild DOE, SpO2 90% on room  air. No significant instability noted while pushing IV pole, cues for energy conservation techniques. Supervision for safety with gait. No buckling of knees.  Stairs            Wheelchair Mobility    Modified Rankin (Stroke Patients Only)       Balance Overall balance assessment: No apparent balance deficits (not formally assessed)                                           Pertinent Vitals/Pain Pain Assessment: 0-10 Pain Score: 5  Pain Location: IV site Pain Descriptors / Indicators: Burning Pain Intervention(s): Other (comment)(RN notified)    Home Living Family/patient expects to be discharged to:: Private residence Living Arrangements: Spouse/significant other Available Help at Discharge: Family;Available 24 hours/day Type of Home: House Home Access: Stairs to enter Entrance Stairs-Rails: Doctor, general practiceight;Left Entrance Stairs-Number of Steps: 2 Home Layout: Two level;Able to live on main level with bedroom/bathroom Home Equipment: None      Prior Function Level of Independence: Independent               Hand Dominance   Dominant Hand: (ambi)    Extremity/Trunk Assessment   Upper Extremity Assessment Upper Extremity Assessment: Defer to OT evaluation    Lower Extremity Assessment Lower Extremity Assessment: Overall WFL for tasks assessed(edematous)       Communication   Communication: No difficulties  Cognition Arousal/Alertness: Awake/alert Behavior During Therapy: WFL for tasks assessed/performed Overall Cognitive Status: Within Functional Limits for tasks assessed  General Comments General comments (skin integrity, edema, etc.): SpO2 96% on 2L supplemental O2, at rest with HR 109.  Ambulatory Sats 90% on room air mild DOE, HR to 120. SpO2 on room air at rest 92% HR, 107.    Exercises     Assessment/Plan    PT Assessment Patient needs continued PT services  PT Problem List  Decreased activity tolerance;Decreased mobility;Cardiopulmonary status limiting activity;Obesity       PT Treatment Interventions Gait training;Functional mobility training;Stair training;Therapeutic exercise;Patient/family education    PT Goals (Current goals can be found in the Care Plan section)  Acute Rehab PT Goals Patient Stated Goal: Breath better PT Goal Formulation: With patient Time For Goal Achievement: 04/19/18 Potential to Achieve Goals: Good    Frequency Min 3X/week   Barriers to discharge        Co-evaluation               AM-PAC PT "6 Clicks" Daily Activity  Outcome Measure Difficulty turning over in bed (including adjusting bedclothes, sheets and blankets)?: None Difficulty moving from lying on back to sitting on the side of the bed? : None Difficulty sitting down on and standing up from a chair with arms (e.g., wheelchair, bedside commode, etc,.)?: A Little Help needed moving to and from a bed to chair (including a wheelchair)?: None Help needed walking in hospital room?: None Help needed climbing 3-5 steps with a railing? : None 6 Click Score: 23    End of Session   Activity Tolerance: Patient tolerated treatment well Patient left: in bed;with call bell/phone within reach   PT Visit Diagnosis: Other abnormalities of gait and mobility (R26.89)    Time: 4098-1191 PT Time Calculation (min) (ACUTE ONLY): 23 min   Charges:   PT Evaluation $PT Eval Moderate Complexity: 1 Mod PT Treatments $Gait Training: 8-22 mins   PT G Codes:        Charlsie Merles, PT, DPT  [name] 04/05/2018, 2:13 PM

## 2018-04-05 NOTE — Progress Notes (Signed)
PROGRESS NOTE    [name]  ZOX:[id number] DOB: May 09, 1971 DOA: 04/01/2018 PCP: Patient, No Pcp Per  Brief Narrative:47 y.o.malewithno significant pastmedical historypresenting withSOB. 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 He has noticed SOB. It kept getting progressively worse. He was struggling for air and decided to come to the ER. Symptoms started about 2 weeks ago. He has had progressive edema of his feet and legs as well as his abdomen for that same period of time. +orthopnea. +PND. +cough productive of "darker, like brown" sputum. Denies chest pain. His right knee has been hurting with swelling, hurts to walk.  ED Course:New CHF. Edema to proximal thighs. Chest pressure with orthopnea. Symptoms for a few weeks. Tachypnea and mild tachycardia. Pleural effusion on right, interstitial edema. Elevated BNP. Negative troponin.     Assessment & Plan:   Principal Problem:   CHF exacerbation (HCC) Active Problems:   Polysubstance abuse (HCC)   Tobacco dependence   Elevated blood pressure reading   Hyperglycemia  1]new onset CHF- patient with dilated cardiomyopathy and severe biventricular failure with ejection fraction 15 to 20%. Patient reports that he is feeling better after diuresis. he did not  use his CPAP last night but was very uncomfortable. He is negative by 4.9 lit since admission. His weight is come down from 304 pounds at the time of admission to 274lbs today. He still has a lot of edema and fluid buildup. Continue Lasix 40 every 12 along with spironolactone 25 mg daily.Entresto being started today.beta blocker to start today per cards.. Renal function stable.2]hypertension his blood pressure is better than yesterday. Continue Lasix and Aldactone.  3]undiagnosed type 2 diabetes-continue SSI for now  4]hypomagnesemia replete   5]polysubstance abuse/alcohol abuse/tobacco  abuse-patient reports that he does use marijuana plus alcohol on a daily basis. Discussed with him the importance of stopping alcohol tobacco and illegal drugs.  6]Undiagnosed sleep apnea patient will need sleep test as an outpatient      DVT prophylaxis Lovenox Code Status: Full code  family Communication: No family available Disposition Plan: Plan discharge early next week after cardiac cath Consultants:  Cardiology  Procedures none Antimicrobials: None  Subjective: No new complaints  reports he lost 25 pounds since admission.  Objective: Vitals:   04/04/18 0344 04/04/18 1209 04/04/18 2002 04/05/18 0605  BP: (!) 131/101 132/90 (!) 140/91 (!) 135/92  Pulse: (!) 101 (!) 104 (!) 101 (!) 107  Resp: 18 20 20 18   Temp: 98.2 F (36.8 C) 98.3 F (36.8 C) 97.8 F (36.6 C) 98.2 F (36.8 C)  TempSrc: Oral Oral Oral Oral  SpO2: 100% 94% 97% 96%  Weight: 131.6 kg (290 lb 3.2 oz)   124.4 kg (274 lb 3.2 oz)  Height:        Intake/Output Summary (Last 24 hours) at 04/05/2018 1046 Last data filed at 04/05/2018 0940 Gross per 24 hour  Intake 1543 ml  Output 6500 ml  Net -4957 ml   Filed Weights   04/03/18 0409 04/04/18 0344 04/05/18 0605  Weight: 135.8 kg (299 lb 4.8 oz) 131.6 kg (290 lb 3.2 oz) 124.4 kg (274 lb 3.2 oz)    Examination:  General exam: Appears calm and comfortable  Respiratory system: Clear to auscultation. Respiratory effort normal. Cardiovascular system: S1 & S2 heard, RRR. No JVD, murmurs, rubs, gallops or clicks. No pedal edema. Gastrointestinal system: Abdomen is nondistended, soft and nontender. No organomegaly or masses felt. Normal bowel  sounds heard. Central nervous system: Alert and oriented. No focal neurological deficits. Extremities: Symmetric 5 x 5 power. Skin: No rashes, lesions or ulcers Psychiatry: Judgement and insight appear normal. Mood & affect appropriate.     Data Reviewed: I have personally reviewed following labs and imaging  studies  CBC: Recent Labs  Lab 04/01/18 0816 04/02/18 1758 04/03/18 0417  WBC 7.8 8.0 7.1  NEUTROABS  --  5.4 4.3  HGB 14.2 14.1 13.5  HCT 45.9 46.6 46.0  MCV 90.7 92.5 93.5  PLT 231 236 233   Basic Metabolic Panel: Recent Labs  Lab 04/01/18 0816 04/02/18 0445 04/03/18 0417 04/04/18 0714 04/05/18 0848  NA 134* 138 137 139 137  K 4.0 4.5 4.5 4.3 3.7  CL 102 96* 96* 94* 88*  CO2 24 30 34* 35* 38*  GLUCOSE 154* 109* 111* 98 142*  BUN 10 10 16 14 7   CREATININE 0.67 0.86 0.78 0.82 0.72  CALCIUM 8.5* 8.8* 8.4* 8.7* 8.6*  MG 1.3* 1.2*  --  1.2*  --    GFR: Estimated Creatinine Clearance: 155.5 mL/min (by C-G formula based on SCr of 0.72 mg/dL). Liver Function Tests: Recent Labs  Lab 04/03/18 0417  AST 25  ALT 24  ALKPHOS 93  BILITOT 1.2  PROT 6.7  ALBUMIN 2.8*   No results for input(s): LIPASE, AMYLASE in the last 168 hours. No results for input(s): AMMONIA in the last 168 hours. Coagulation Profile: No results for input(s): INR, PROTIME in the last 168 hours. Cardiac Enzymes: No results for input(s): CKTOTAL, CKMB, CKMBINDEX, TROPONINI in the last 168 hours. BNP (last 3 results) No results for input(s): PROBNP in the last 8760 hours. HbA1C: No results for input(s): HGBA1C in the last 72 hours. CBG: Recent Labs  Lab 04/04/18 0744 04/04/18 1120 04/04/18 1646 04/04/18 2117 04/05/18 0726  GLUCAP 91 182* 161* 137* 102*   Lipid Profile: No results for input(s): CHOL, HDL, LDLCALC, TRIG, CHOLHDL, LDLDIRECT in the last 72 hours. Thyroid Function Tests: No results for input(s): TSH, T4TOTAL, FREET4, T3FREE, THYROIDAB in the last 72 hours. Anemia Panel: No results for input(s): VITAMINB12, FOLATE, FERRITIN, TIBC, IRON, RETICCTPCT in the last 72 hours. Sepsis Labs: No results for input(s): PROCALCITON, LATICACIDVEN in the last 168 hours.  No results found for this or any previous visit (from the past 240 hour(s)).       Radiology Studies: No results  found.      Scheduled Meds: . aspirin EC  81 mg Oral Daily  . enoxaparin (LOVENOX) injection  40 mg Subcutaneous Q24H  . folic acid  1 mg Oral Daily  . furosemide  40 mg Intravenous Q12H  . insulin aspart  0-15 Units Subcutaneous TID WC  . insulin aspart  0-5 Units Subcutaneous QHS  . LORazepam  0-4 mg Intravenous Q12H  . mouth rinse  15 mL Mouth Rinse BID  . multivitamin with minerals  1 tablet Oral Daily  . nicotine  14 mg Transdermal Daily  . sacubitril-valsartan  1 tablet Oral BID  . sodium chloride flush  3 mL Intravenous Q12H  . spironolactone  25 mg Oral Daily  . thiamine  100 mg Oral Daily   Or  . thiamine  100 mg Intravenous Daily   Continuous Infusions: . sodium chloride       LOS: 4 days     Alwyn RenElizabeth G Mathews, MD [facility]  If 7PM-7AM, please contact night-coverage [email or web address] Password [email or web address] 04/05/2018, 10:46 AM

## 2018-04-06 LAB — BASIC METABOLIC PANEL
Anion gap: 6 (ref 5–15)
BUN: 10 mg/dL (ref 6–20)
CHLORIDE: 91 mmol/L — AB (ref 101–111)
CO2: 41 mmol/L — AB (ref 22–32)
CREATININE: 0.8 mg/dL (ref 0.61–1.24)
Calcium: 8.5 mg/dL — ABNORMAL LOW (ref 8.9–10.3)
GFR calc Af Amer: >60 mL/min (ref >60)
GFR calc non Af Amer: >60 mL/min (ref >60)
GLUCOSE: 123 mg/dL — AB (ref 65–99)
POTASSIUM: 4.2 mmol/L (ref 3.5–5.1)
Sodium: 138 mmol/L (ref 135–145)

## 2018-04-06 LAB — MAGNESIUM: Magnesium: 1.6 mg/dL — ABNORMAL LOW (ref 1.7–2.4)

## 2018-04-06 LAB — GLUCOSE, CAPILLARY
GLUCOSE-CAPILLARY: 167 mg/dL — AB (ref 65–99)
Glucose-Capillary: 144 mg/dL — ABNORMAL HIGH (ref 65–99)
Glucose-Capillary: 121 mg/dL — ABNORMAL HIGH (ref 65–99)
Glucose-Capillary: 144 mg/dL — ABNORMAL HIGH (ref 65–99)

## 2018-04-06 MED ORDER — SODIUM CHLORIDE 0.9 % IV SOLN
INTRAVENOUS | Status: DC
  Administered 2018-04-07: 06:00:00 via INTRAVENOUS

## 2018-04-06 MED ORDER — ALPRAZOLAM 0.5 MG PO TABS
1.0000 mg | ORAL_TABLET | Freq: Three times a day (TID) | ORAL | Status: DC | PRN
Start: 2018-04-06 — End: 2018-04-08
  Administered 2018-04-06 – 2018-04-08 (×5): 1 mg via ORAL
  Filled 2018-04-06 (×5): qty 2

## 2018-04-06 MED ORDER — SODIUM CHLORIDE 0.9% FLUSH: 3.0000 mL | INTRAVENOUS | Status: DC | PRN

## 2018-04-06 MED ORDER — SODIUM CHLORIDE 0.9% FLUSH
3.0000 mL | Freq: Two times a day (BID) | INTRAVENOUS | Status: DC
  Administered 2018-04-06: 3 mL via INTRAVENOUS

## 2018-04-06 MED ORDER — MAGNESIUM SULFATE 4 GM/100ML IV SOLN
4.0000 g | Freq: Once | INTRAVENOUS | Status: AC
  Administered 2018-04-06: 4 g via INTRAVENOUS
  Filled 2018-04-06: qty 100

## 2018-04-06 MED ORDER — ASPIRIN 81 MG PO CHEW
81.0000 mg | CHEWABLE_TABLET | ORAL | Status: AC
  Administered 2018-04-07: 81 mg via ORAL
  Filled 2018-04-06: qty 1

## 2018-04-06 MED ORDER — SODIUM CHLORIDE 0.9 % IV SOLN: 250.0000 mL | INTRAVENOUS | Status: DC | PRN

## 2018-04-06 MED ORDER — NICOTINE 14 MG/24HR TD PT24
14.0000 mg | MEDICATED_PATCH | Freq: Every day | TRANSDERMAL | Status: DC
  Administered 2018-04-06 – 2018-04-07 (×2): 14 mg via TRANSDERMAL
  Filled 2018-04-06 (×2): qty 1

## 2018-04-06 MED ORDER — FUROSEMIDE 40 MG PO TABS
40.0000 mg | ORAL_TABLET | Freq: Two times a day (BID) | ORAL | Status: DC
Start: 2018-04-07 — End: 2018-04-08
  Administered 2018-04-08: 40 mg via ORAL
  Filled 2018-04-06 (×2): qty 1

## 2018-04-06 MED ORDER — DIPHENHYDRAMINE HCL 25 MG PO CAPS
25.0000 mg | ORAL_CAPSULE | Freq: Three times a day (TID) | ORAL | Status: DC | PRN
  Administered 2018-04-06 – 2018-04-07 (×2): 25 mg via ORAL
  Filled 2018-04-06 (×2): qty 1

## 2018-04-06 NOTE — H&P (View-Only) (Signed)
Subjective:  Breathing and leg edema significantly improved.  Objective:  Vital Signs in the last 24 hours: Temp:  [97.7 F (36.5 C)-98.4 F (36.9 C)] 97.7 F (36.5 C) (06/03 1957) Pulse Rate:  [84-90] 84 (06/03 1957) Resp:  [18] 18 (06/03 1957) BP: (108-119)/(75-90) 108/75 (06/03 1957) SpO2:  [95 %-97 %] 97 % (06/03 1957) Weight:  [121.6 kg (268 lb 1.6 oz)] 121.6 kg (268 lb 1.6 oz) (06/03 0433)  Intake/Output from previous day: 06/02 0701 - 06/03 0700 In: 2211 [P.O.:2108; I.V.:3; IV Piggyback:100] Out: 4150 [Urine:4150] Intake/Output from this shift: No intake/output data recorded.   Net -17L for this admission  Physical Exam: Nursing note and vitals reviewed. Constitutional: He is oriented to person, place, and time. No distress.  Morbidly obese.  Cardiovascular: Regular rhythm, rate. No murmur heard. Respiratory: Effort normal. No respiratory distress. He has no wheezes. He has no rales.  GI: Soft. Bowel sounds are normal. He exhibits no distension. There is no tenderness.  Musculoskeletal: He exhibits 1+ b/l edema .  Neurological: He is alert and oriented to person, place, and time. No cranial nerve deficit.  Skin: Skin is warm and dry.  Psychiatric: He has a normal mood and affect.      Lab Results: No results for input(s): WBC, HGB, PLT in the last 72 hours. Recent Labs    04/05/18 0848 04/06/18 0425  NA 137 138  K 3.7 4.2  CL 88* 91*  CO2 38* 41*  GLUCOSE 142* 123*  BUN 7 10  CREATININE 0.72 0.80   No results for input(s): TROPONINI in the last 72 hours.  Invalid input(s): CK, MB Hepatic Function Panel No results for input(s): PROT, ALBUMIN, AST, ALT, ALKPHOS, BILITOT, BILIDIR, IBILI in the last 72 hours. No results for input(s): CHOL in the last 72 hours.   EKG 04/02/2018: Sinus tachycardia 107 bpm. Biatrial enlargement. Normal axis, normal conduction. Possible old anteroseptal infarct. No ischemic changes.  Echocardiogram 04/02/2018: Study  Conclusions  - Left ventricle: The cavity size was moderately dilated. Wall thickness was normal. Systolic function was severely reduced. The estimated ejection fraction was in the range of 15% to 20%. There is severe global hypokinesis with no regional differences in the wall motion. Doppler parameters are consistent with a reversible restrictive pattern, indicative of decreased left ventricular diastolic compliance and/or increased left atrial pressure (grade 3 diastolic dysfunction). - Right ventricle: The cavity size was mildly dilated. Systolic function was moderately reduced. - Right atrium: The atrium was mildly dilated. - Tricuspid valve: Mildly dilated annulus. There was mild-moderate regurgitation.  Impressions:  - Dilated cardiomyopathy with severe biventricular failure. Visual LVEF 15-20%. Mild to mod tricuspid regurgitation with elevated central venous pressure. 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 However, he does have risk factors for CAD and will need ischemic evaluation. Currently NYHA class III-IV. Remarkable urine output this admission, but still has at least 5 L more to diurese. Alcohol abuse Tobacco abuse Suspected severe obstructive sleep apnea Morbid obesity  Recommendations: Switch lasix IV 40 mg bid to PO 40 mg bid Continue metoprolol succinate 25 mg daily. Continue Entresto 24-26 mg bid. Continue spironolactone 25 mg daily. Plan on performing right and left heart cath on Tuesday 06/04 am Continue CPAP use.  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  He should wear CPAP whenever he sleeps. Will need formal outpatient sleep study. Continue management of alcohol withdrawal as  you're doing. Increase activity as tolerated.   I have discussed patient's diagnosis, prognosis, and management plan at length with patient and his fiance. I have reemphasized that any more alcohol will be deleterious for him. Also emphasized tobacco cessation.  Potentially, could be discharged after cath on Tuesday 06/04. Will arrange outpatient follow up.   LOS: 5 days    Rashanna Christiana J Dinh Ayotte 04/06/2018, 9:04 PM  Jhoan Schmieder Emiliano Dyer, MD [facility] [place] Pager: [phone] Office: [phone] If no answer Cell [phone]

## 2018-04-06 NOTE — Progress Notes (Signed)
PROGRESS NOTE    SENDER RUEB  ZOX:[id number] DOB: 07-02-1971 DOA: 04/01/2018 PCP: Patient, No Pcp Per   Brief [id number] y.o.malewithno significant pastmedical historypresenting withSOB. 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 He has noticed SOB. It kept getting progressively worse. He was struggling for air and decided to come to the ER. Symptoms started about 2 weeks ago. He has had progressive edema of his feet and legs as well as his abdomen for that same period of time. +orthopnea. +PND. +cough productive of "darker, like brown" sputum. Denies chest pain. His right knee has been hurting with swelling, hurts to walk.  ED Course:New CHF. Edema to proximal thighs. Chest pressure with orthopnea. Symptoms for a few weeks. Tachypnea and mild tachycardia. Pleural effusion on right, interstitial edema. Elevated BNP. Negative troponin.    Assessment & Plan:   Principal Problem:   CHF exacerbation (HCC) Active Problems:   Polysubstance abuse (HCC)   Tobacco dependence   Elevated blood pressure reading   Hyperglycemia 1]new onset CHF- patient with dilated cardiomyopathy and severe biventricular failure with ejection fraction 15 to 20%. Patient reports that he is feeling better after diuresis. he did not  use his CPAP last night but was very uncomfortable. His weight is come down from 304 pounds at the time of admission to 268 lbstoday. He still has a lot of edema and fluid buildup. Continue Lasix 40 every 12 along with spironolactone 25 mg daily.Entresto being started today.beta blocker started yesterday by cardiology... Renal function stable. 2]hypertension his blood pressure is better than yesterday. Continue Lasix and Aldactone.  3]undiagnosed type 2 diabetes-continue SSI for now will need oral hypoglycemic agents at the time of discharge.  4]hypomagnesemiareplete  5]polysubstance  abuse/alcohol abuse/tobacco abuse-patient reports that he does use marijuana plus alcohol on a daily basis. Discussed with him the importance of stopping alcohol tobacco and illegal drugs.  6]Undiagnosed sleep apnea patient will need sleep test as an outpatient       DVT prophylaxis: Lovenox Code Status: Full code Family Communication: None Disposition Plan: TBD none  Consultants: Cardiology Procedures:none Antimicrobials none Subjective: Very anxious about everything patient to have cath tomorrow requesting to have a shower today.  Feels breathing is better denies any chest pain walked with therapy yesterday.  Objective: Vitals:   04/05/18 1927 04/06/18 0433 04/06/18 0933 04/06/18 1236  BP: 116/87 119/89 114/85 110/90  Pulse: 94 90 85 88  Resp: 20 18 18 18   Temp: (!) 97.5 F (36.4 C) 97.9 F (36.6 C) 98.4 F (36.9 C) 98.1 F (36.7 C)  TempSrc: Oral Oral Oral Oral  SpO2: 95% 95% 96% 97%  Weight:  121.6 kg (268 lb 1.6 oz)    Height:        Intake/Output Summary (Last 24 hours) at 04/06/2018 1249 Last data filed at 04/06/2018 1024 Gross per 24 hour  Intake 1848 ml  Output 2950 ml  Net -1102 ml   Filed Weights   04/04/18 0344 04/05/18 0605 04/06/18 0433  Weight: 131.6 kg (290 lb 3.2 oz) 124.4 kg (274 lb 3.2 oz) 121.6 kg (268 lb 1.6 oz)    Examination:  General exam: Appears calm and comfortable  Respiratory system: Clear to auscultation. Respiratory effort normal. Cardiovascular system: crackles b/l bases RRR. No JVD, murmurs, rubs, gallops or clicks. No pedal edema. Gastrointestinal system: Abdomen is nondistended, soft and nontender. No organomegaly or masses felt. Normal bowel sounds heard. Central nervous system: Alert and  oriented. No focal neurological deficits. Extremities:decreasing edema Skin: No rashes, lesions or ulcers Psychiatry: Judgement and insight appear normal. Mood & affect appropriate.     Data Reviewed: I have personally reviewed  following labs and imaging studies  CBC: Recent Labs  Lab 04/01/18 0816 04/02/18 1758 04/03/18 0417  WBC 7.8 8.0 7.1  NEUTROABS  --  5.4 4.3  HGB 14.2 14.1 13.5  HCT 45.9 46.6 46.0  MCV 90.7 92.5 93.5  PLT 231 236 233   Basic Metabolic Panel: Recent Labs  Lab 04/01/18 0816 04/02/18 0445 04/03/18 0417 04/04/18 0714 04/05/18 0848 04/06/18 0425  NA 134* 138 137 139 137 138  K 4.0 4.5 4.5 4.3 3.7 4.2  CL 102 96* 96* 94* 88* 91*  CO2 24 30 34* 35* 38* 41*  GLUCOSE 154* 109* 111* 98 142* 123*  BUN 10 10 16 14 7 10   CREATININE 0.67 0.86 0.78 0.82 0.72 0.80  CALCIUM 8.5* 8.8* 8.4* 8.7* 8.6* 8.5*  MG 1.3* 1.2*  --  1.2*  --  1.6*   GFR: Estimated Creatinine Clearance: 153.7 mL/min (by C-G formula based on SCr of 0.8 mg/dL). Liver Function Tests: Recent Labs  Lab 04/03/18 0417  AST 25  ALT 24  ALKPHOS 93  BILITOT 1.2  PROT 6.7  ALBUMIN 2.8*   No results for input(s): LIPASE, AMYLASE in the last 168 hours. No results for input(s): AMMONIA in the last 168 hours. Coagulation Profile: No results for input(s): INR, PROTIME in the last 168 hours. Cardiac Enzymes: No results for input(s): CKTOTAL, CKMB, CKMBINDEX, TROPONINI in the last 168 hours. BNP (last 3 results) No results for input(s): PROBNP in the last 8760 hours. HbA1C: No results for input(s): HGBA1C in the last 72 hours. CBG: Recent Labs  Lab 04/05/18 1149 04/05/18 1636 04/05/18 2141 04/06/18 0837 04/06/18 1137  GLUCAP 191* 174* 159* 144* 121*   Lipid Profile: No results for input(s): CHOL, HDL, LDLCALC, TRIG, CHOLHDL, LDLDIRECT in the last 72 hours. Thyroid Function Tests: No results for input(s): TSH, T4TOTAL, FREET4, T3FREE, THYROIDAB in the last 72 hours. Anemia Panel: No results for input(s): VITAMINB12, FOLATE, FERRITIN, TIBC, IRON, RETICCTPCT in the last 72 hours. Sepsis Labs: No results for input(s): PROCALCITON, LATICACIDVEN in the last 168 hours.  No results found for this or any  previous visit (from the past 240 hour(s)).       Radiology Studies: No results found.      Scheduled Meds: . aspirin EC  81 mg Oral Daily  . enoxaparin (LOVENOX) injection  40 mg Subcutaneous Q24H  . folic acid  1 mg Oral Daily  . furosemide  40 mg Intravenous Q12H  . insulin aspart  0-15 Units Subcutaneous TID WC  . insulin aspart  0-5 Units Subcutaneous QHS  . mouth rinse  15 mL Mouth Rinse BID  . metoprolol succinate  25 mg Oral Daily  . multivitamin with minerals  1 tablet Oral Daily  . nicotine  14 mg Transdermal Daily  . sacubitril-valsartan  1 tablet Oral BID  . sodium chloride flush  3 mL Intravenous Q12H  . spironolactone  25 mg Oral Daily  . thiamine  100 mg Oral Daily   Or  . thiamine  100 mg Intravenous Daily   Continuous Infusions: . sodium chloride    . magnesium sulfate 1 - 4 g bolus IVPB       LOS: 5 days     Alwyn RenElizabeth G Annalise Mcdiarmid, MD [facility]  If 7PM-7AM, please  contact night-coverage [email or web address] Password [facility] 04/06/2018, 12:49 PM

## 2018-04-06 NOTE — Plan of Care (Signed)
Patient remained injury free but continue to require additional education about his disease process. Will continue to help reinforce any education provided and needed.

## 2018-04-06 NOTE — Progress Notes (Signed)
Pt educated the fall prevention safety plan with understanding. Pt refused the bed alarm on tonight. Pt said he is going to call if he needs help. Will continue to monitor pt.

## 2018-04-06 NOTE — Progress Notes (Signed)
Subjective:  Breathing and leg edema significantly improved.  Objective:  Vital Signs in the last 24 hours: Temp:  [97.7 F (36.5 C)-98.4 F (36.9 C)] 97.7 F (36.5 C) (06/03 1957) Pulse Rate:  [84-90] 84 (06/03 1957) Resp:  [18] 18 (06/03 1957) BP: (108-119)/(75-90) 108/75 (06/03 1957) SpO2:  [95 %-97 %] 97 % (06/03 1957) Weight:  [121.6 kg (268 lb 1.6 oz)] 121.6 kg (268 lb 1.6 oz) (06/03 0433)  Intake/Output from previous day: 06/02 0701 - 06/03 0700 In: 2211 [P.O.:2108; I.V.:3; IV Piggyback:100] Out: 4150 [Urine:4150] Intake/Output from this shift: No intake/output data recorded.   Net -17L for this admission  Physical Exam: Nursing note and vitals reviewed. Constitutional: He is oriented to person, place, and time. No distress.  Morbidly obese.  Cardiovascular: Regular rhythm, rate. No murmur heard. Respiratory: Effort normal. No respiratory distress. He has no wheezes. He has no rales.  GI: Soft. Bowel sounds are normal. He exhibits no distension. There is no tenderness.  Musculoskeletal: He exhibits 1+ b/l edema .  Neurological: He is alert and oriented to person, place, and time. No cranial nerve deficit.  Skin: Skin is warm and dry.  Psychiatric: He has a normal mood and affect.      Lab Results: No results for input(s): WBC, HGB, PLT in the last 72 hours. Recent Labs    04/05/18 0848 04/06/18 0425  NA 137 138  K 3.7 4.2  CL 88* 91*  CO2 38* 41*  GLUCOSE 142* 123*  BUN 7 10  CREATININE 0.72 0.80   No results for input(s): TROPONINI in the last 72 hours.  Invalid input(s): CK, MB Hepatic Function Panel No results for input(s): PROT, ALBUMIN, AST, ALT, ALKPHOS, BILITOT, BILIDIR, IBILI in the last 72 hours. No results for input(s): CHOL in the last 72 hours.   EKG 04/02/2018: Sinus tachycardia 107 bpm. Biatrial enlargement. Normal axis, normal conduction. Possible old anteroseptal infarct. No ischemic changes.  Echocardiogram 04/02/2018: Study  Conclusions  - Left ventricle: The cavity size was moderately dilated. Wall thickness was normal. Systolic function was severely reduced. The estimated ejection fraction was in the range of 15% to 20%. There is severe global hypokinesis with no regional differences in the wall motion. Doppler parameters are consistent with a reversible restrictive pattern, indicative of decreased left ventricular diastolic compliance and/or increased left atrial pressure (grade 3 diastolic dysfunction). - Right ventricle: The cavity size was mildly dilated. Systolic function was moderately reduced. - Right atrium: The atrium was mildly dilated. - Tricuspid valve: Mildly dilated annulus. There was mild-moderate regurgitation.  Impressions:  - Dilated cardiomyopathy with severe biventricular failure. Visual LVEF 15-20%. Mild to mod tricuspid regurgitation with elevated central venous pressure. 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 However, he does have risk factors for CAD and will need ischemic evaluation. Currently NYHA class III-IV. Remarkable urine output this admission, but still has at least 5 L more to diurese. Alcohol abuse Tobacco abuse Suspected severe obstructive sleep apnea Morbid obesity  Recommendations: Switch lasix IV 40 mg bid to PO 40 mg bid Continue metoprolol succinate 25 mg daily. Continue Entresto 24-26 mg bid. Continue spironolactone 25 mg daily. Plan on performing right and left heart cath on Tuesday 06/04 am Continue CPAP use.  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  He should wear CPAP whenever he sleeps. Will need formal outpatient sleep study. Continue management of alcohol withdrawal as  you're doing. Increase activity as tolerated.   I have discussed patient's diagnosis, prognosis, and management plan at length with patient and his fiance. I have reemphasized that any more alcohol will be deleterious for him. Also emphasized tobacco cessation.  Potentially, could be discharged after cath on Tuesday 06/04. Will arrange outpatient follow up.   LOS: 5 days    Aubert Choyce J Lamiyah Schlotter 04/06/2018, 9:04 PM  Yashira Offenberger Emiliano Dyer, MD [facility] [place] Pager: [phone] Office: [phone] If no answer Cell [phone]

## 2018-04-06 NOTE — Progress Notes (Signed)
Patient refused a bath. He prefers a shower in the AM

## 2018-04-07 ENCOUNTER — Encounter (HOSPITAL_COMMUNITY)
Admission: EM | Disposition: A | Discharge status: Home / Self Care | Payer: Self-pay | Source: Home / Self Care | Attending: Internal Medicine

## 2018-04-07 ENCOUNTER — Encounter (HOSPITAL_COMMUNITY): Payer: Self-pay | Admitting: Cardiology

## 2018-04-07 HISTORY — PX: RIGHT/LEFT HEART CATH AND CORONARY ANGIOGRAPHY: CATH118266

## 2018-04-07 LAB — POCT I-STAT 3, VENOUS BLOOD GAS (G3P V)
Acid-Base Excess: 11 mmol/L — ABNORMAL HIGH (ref 0.0–2.0)
BICARBONATE: 38.1 mmol/L — AB (ref 20.0–28.0)
O2 SAT: 59 %
PCO2 VEN: 61.6 mmHg — AB (ref 44.0–60.0)
TCO2: 40 mmol/L — AB (ref 22–32)
pH, Ven: 7.399 (ref 7.250–7.430)
pO2, Ven: 32 mmHg (ref 32.0–45.0)

## 2018-04-07 LAB — BASIC METABOLIC PANEL
Anion gap: 10 (ref 5–15)
BUN: 16 mg/dL (ref 6–20)
CO2: 35 mmol/L — AB (ref 22–32)
CREATININE: 0.81 mg/dL (ref 0.61–1.24)
Calcium: 8.8 mg/dL — ABNORMAL LOW (ref 8.9–10.3)
Chloride: 93 mmol/L — ABNORMAL LOW (ref 101–111)
GFR calc non Af Amer: >60 mL/min (ref >60)
Glucose, Bld: 113 mg/dL — ABNORMAL HIGH (ref 65–99)
Potassium: 3.8 mmol/L (ref 3.5–5.1)
Sodium: 138 mmol/L (ref 135–145)

## 2018-04-07 LAB — CBC
HCT: 47.1 % (ref 39.0–52.0)
Hemoglobin: 14 g/dL (ref 13.0–17.0)
MCH: 27.5 pg (ref 26.0–34.0)
MCHC: 29.7 g/dL — AB (ref 30.0–36.0)
MCV: 92.5 fL (ref 78.0–100.0)
PLATELETS: 205 10*3/uL (ref 150–400)
RBC: 5.09 MIL/uL (ref 4.22–5.81)
RDW: 15.3 % (ref 11.5–15.5)
WBC: 6.5 10*3/uL (ref 4.0–10.5)

## 2018-04-07 LAB — GLUCOSE, CAPILLARY
Glucose-Capillary: 216 mg/dL — ABNORMAL HIGH (ref 65–99)
Glucose-Capillary: 110 mg/dL — ABNORMAL HIGH (ref 65–99)
Glucose-Capillary: 232 mg/dL — ABNORMAL HIGH (ref 65–99)

## 2018-04-07 LAB — POCT I-STAT 3, ART BLOOD GAS (G3+)
ACID-BASE EXCESS: 9 mmol/L — AB (ref 0.0–2.0)
BICARBONATE: 35.5 mmol/L — AB (ref 20.0–28.0)
O2 SAT: 92 %
TCO2: 37 mmol/L — AB (ref 22–32)
pCO2 arterial: 56.2 mmHg — ABNORMAL HIGH (ref 32.0–48.0)
pH, Arterial: 7.409 (ref 7.350–7.450)
pO2, Arterial: 64 mmHg — ABNORMAL LOW (ref 83.0–108.0)

## 2018-04-07 LAB — CREATININE, SERUM
Creatinine, Ser: 0.84 mg/dL (ref 0.61–1.24)
GFR calc non Af Amer: >60 mL/min (ref >60)

## 2018-04-07 SURGERY — RIGHT/LEFT HEART CATH AND CORONARY ANGIOGRAPHY
Anesthesia: LOCAL

## 2018-04-07 MED ORDER — GLIPIZIDE 2.5 MG HALF TABLET
2.5000 mg | ORAL_TABLET | Freq: Every day | ORAL | Status: DC
  Filled 2018-04-07: qty 1

## 2018-04-07 MED ORDER — SODIUM CHLORIDE 0.9% FLUSH
3.0000 mL | Freq: Two times a day (BID) | INTRAVENOUS | Status: DC
  Administered 2018-04-08: 3 mL via INTRAVENOUS

## 2018-04-07 MED ORDER — SODIUM CHLORIDE 0.9% FLUSH: 3.0000 mL | INTRAVENOUS | Status: DC | PRN

## 2018-04-07 MED ORDER — MIDAZOLAM HCL 2 MG/2ML IJ SOLN
INTRAMUSCULAR | Status: DC | PRN
  Administered 2018-04-07 (×2): 1 mg via INTRAVENOUS

## 2018-04-07 MED ORDER — FENTANYL CITRATE (PF) 100 MCG/2ML IJ SOLN
INTRAMUSCULAR | Status: AC
  Filled 2018-04-07: qty 2

## 2018-04-07 MED ORDER — VERAPAMIL HCL 2.5 MG/ML IV SOLN
INTRAVENOUS | Status: AC
  Filled 2018-04-07: qty 2

## 2018-04-07 MED ORDER — HEPARIN (PORCINE) IN NACL 1000-0.9 UT/500ML-% IV SOLN
INTRAVENOUS | Status: AC
  Filled 2018-04-07: qty 1000

## 2018-04-07 MED ORDER — LIDOCAINE HCL (PF) 1 % IJ SOLN
INTRAMUSCULAR | Status: AC
  Filled 2018-04-07: qty 30

## 2018-04-07 MED ORDER — LIDOCAINE HCL (PF) 1 % IJ SOLN
INTRAMUSCULAR | Status: DC | PRN
  Administered 2018-04-07: 5 mL

## 2018-04-07 MED ORDER — HEPARIN (PORCINE) IN NACL 2-0.9 UNITS/ML
INTRAMUSCULAR | Status: AC | PRN
  Administered 2018-04-07 (×2): 500 mL

## 2018-04-07 MED ORDER — HEPARIN SODIUM (PORCINE) 1000 UNIT/ML IJ SOLN
INTRAMUSCULAR | Status: AC
  Filled 2018-04-07: qty 1

## 2018-04-07 MED ORDER — OXYCODONE HCL 5 MG PO TABS
5.0000 mg | ORAL_TABLET | Freq: Once | ORAL | Status: AC
  Administered 2018-04-07: 5 mg via ORAL
  Filled 2018-04-07: qty 1

## 2018-04-07 MED ORDER — IOHEXOL 350 MG/ML SOLN
INTRAVENOUS | Status: DC | PRN
  Administered 2018-04-07: 30 mL via INTRAVENOUS

## 2018-04-07 MED ORDER — ACETAMINOPHEN 325 MG PO TABS: 650.0000 mg | ORAL_TABLET | ORAL | Status: DC | PRN

## 2018-04-07 MED ORDER — HEPARIN SODIUM (PORCINE) 1000 UNIT/ML IJ SOLN
INTRAMUSCULAR | Status: DC | PRN
  Administered 2018-04-07: 6000 [IU] via INTRAVENOUS

## 2018-04-07 MED ORDER — MIDAZOLAM HCL 2 MG/2ML IJ SOLN
INTRAMUSCULAR | Status: AC
  Filled 2018-04-07: qty 2

## 2018-04-07 MED ORDER — FENTANYL CITRATE (PF) 100 MCG/2ML IJ SOLN
INTRAMUSCULAR | Status: DC | PRN
  Administered 2018-04-07: 50 ug via INTRAVENOUS

## 2018-04-07 MED ORDER — FUROSEMIDE 10 MG/ML IJ SOLN
40.0000 mg | Freq: Two times a day (BID) | INTRAMUSCULAR | Status: AC
  Administered 2018-04-07 (×2): 40 mg via INTRAVENOUS
  Filled 2018-04-07 (×2): qty 4

## 2018-04-07 MED ORDER — MAGNESIUM SULFATE 4 GM/100ML IV SOLN
4.0000 g | Freq: Once | INTRAVENOUS | Status: AC
  Administered 2018-04-07: 4 g via INTRAVENOUS
  Filled 2018-04-07: qty 100

## 2018-04-07 MED ORDER — ONDANSETRON HCL 4 MG/2ML IJ SOLN: 4.0000 mg | Freq: Four times a day (QID) | INTRAMUSCULAR | Status: DC | PRN

## 2018-04-07 MED ORDER — VERAPAMIL HCL 2.5 MG/ML IV SOLN
INTRAVENOUS | Status: DC | PRN
  Administered 2018-04-07: 5 mL via INTRA_ARTERIAL

## 2018-04-07 MED ORDER — SODIUM CHLORIDE 0.9 % IV SOLN: 250.0000 mL | INTRAVENOUS | Status: DC | PRN

## 2018-04-07 MED ORDER — ENOXAPARIN SODIUM 40 MG/0.4ML ~~LOC~~ SOLN
40.0000 mg | SUBCUTANEOUS | Status: DC
  Administered 2018-04-08: 40 mg via SUBCUTANEOUS
  Filled 2018-04-07: qty 0.4

## 2018-04-07 SURGICAL SUPPLY — 14 items
CATH BALLN WEDGE 5F 110CM (CATHETERS) ×2 IMPLANT
CATH INFINITI 5 FR JL3.5 (CATHETERS) ×2 IMPLANT
CATH INFINITI 5FR ANG PIGTAIL (CATHETERS) ×2 IMPLANT
CATH INFINITI JR4 5F (CATHETERS) ×2 IMPLANT
DEVICE RAD COMP TR BAND LRG (VASCULAR PRODUCTS) ×2 IMPLANT
GUIDEWIRE INQWIRE 1.5J.035X260 (WIRE) ×2 IMPLANT
INQWIRE 1.5J .035X260CM (WIRE) ×4
KIT HEART LEFT (KITS) ×2 IMPLANT
NEEDLE PERC 21GX4CM (NEEDLE) ×2 IMPLANT
PACK CARDIAC CATHETERIZATION (CUSTOM PROCEDURE TRAY) ×2 IMPLANT
SHEATH RAIN 4/5FR (SHEATH) ×2 IMPLANT
SHEATH RAIN RADIAL 21G 6FR (SHEATH) ×2 IMPLANT
TRANSDUCER W/STOPCOCK (MISCELLANEOUS) ×2 IMPLANT
TUBING CIL FLEX 10 FLL-RA (TUBING) ×2 IMPLANT

## 2018-04-07 NOTE — Progress Notes (Addendum)
CM following for progression of care, please see full CM consult 04/02/2018 by Dixon BoosA Shavis RN CM [phone]

## 2018-04-07 NOTE — Progress Notes (Signed)
PROGRESS NOTE    [name]  WRU:[id number] DOB: 1971-06-17 DOA: 04/01/2018 PCP: Patient, No Pcp Per   Brief Narrative:46 y.o.malewithno significant pastmedical historypresenting withSOB. 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 He has noticed SOB. It kept getting progressively worse. He was struggling for air and decided to come to the ER. Symptoms started about 2 weeks ago. He has had progressive edema of his feet and legs as well as his abdomen for that same period of time. +orthopnea. +PND. +cough productive of "darker, like brown" sputum. Denies chest pain. His right knee has been hurting with swelling, hurts to walk.  ED Course:New CHF. Edema to proximal thighs. Chest pressure with orthopnea. Symptoms for a few weeks. Tachypnea and mild tachycardia. Pleural effusion on right, interstitial edema. Elevated BNP. Negative troponin.    Assessment & Plan:   Principal Problem:   CHF exacerbation (HCC) Active Problems:   Polysubstance abuse (HCC)   Tobacco dependence   Elevated blood pressure reading   Hyperglycemia  1]new onset CHF- patient with dilated cardiomyopathy and severe biventricular failure with ejection fraction 15 to 20%.His weight is come down from 304 pounds at the time of admission to 268 lbstoday. He still has a lot of edema and fluid buildup. Continue Lasix 40 every 12 along with spironolactone 25 mg daily,Entresto and to beta-blocker.  Renal function stable.  She had cardiac cath 04/07/2018.  Cath showed--mild nonobstructive CAD, dilated nonischemic cardiomyopathy, pulmonary hypertension.  2]hypertension his blood pressure is better than yesterday. Continue Lasix and Aldactone.  3]undiagnosed type 2 diabetes-continue SSI.  Start glipizide.  Avoid metformin due to recent cath at this time.  4]hypomagnesemiareplete  5]polysubstance abuse/alcohol abuse/tobacco abuse-patient  reports that he does use marijuana plus alcohol on a daily basis. Discussed with him the importance of stopping alcohol tobacco and illegal drugs.  6]Undiagnosed sleep apnea patient will need sleep test as an outpatient        DVT prophylaxis: Lovenox Code Status: Full code Family Communication: No family available Disposition Plan: If he remains stable possible discharge by Thursday or Friday Once okay with cardiology.  Consultants: Cardiology  Procedures: Cath 04/07/2018 Antimicrobials: None Subjective: He reports that he feels better no chest pain shortness of breath reported walked with therapy yesterday denies any nausea vomiting or diarrhea.   Objective: Vitals:   04/07/18 1000 04/07/18 1037 04/07/18 1100 04/07/18 1130  BP: (!) 120/92 105/75 100/83 105/60  Pulse: 90 83 85 86  Resp:      Temp:   (!) 97.3 F (36.3 C)   TempSrc:   Oral   SpO2: 96%     Weight:      Height:        Intake/Output Summary (Last 24 hours) at 04/07/2018 1227 Last data filed at 04/07/2018 1000 Gross per 24 hour  Intake 462 ml  Output 925 ml  Net -463 ml   Filed Weights   04/05/18 0605 04/06/18 0433 04/07/18 0542  Weight: 124.4 kg (274 lb 3.2 oz) 121.6 kg (268 lb 1.6 oz) 120.6 kg (265 lb 12.8 oz)    Examination:  General exam: Appears calm and comfortable  Respiratory system: Crackles decreased to auscultation. Respiratory effort normal. Cardiovascular system: S1 & S2 heard, RRR. No JVD, murmurs, rubs, gallops or clicks. No pedal edema. Gastrointestinal system: Abdomen is nondistended, soft and nontender. No organomegaly or masses felt. Normal bowel sounds heard. Central nervous system: Alert and oriented. No focal neurological deficits.  Extremities: one plus edema. Skin: No rashes, lesions or ulcers Psychiatry: Judgement and insight appear normal. Mood & affect appropriate.     Data Reviewed: I have personally reviewed following labs and imaging studies  CBC: Recent Labs    Lab 2018-04-12 0816 04/02/18 1758 04/03/18 0417 04/07/18 0859  WBC 7.8 8.0 7.1 6.5  NEUTROABS  --  5.4 4.3  --   HGB 14.2 14.1 13.5 14.0  HCT 45.9 46.6 46.0 47.1  MCV 90.7 92.5 93.5 92.5  PLT 231 236 233 205   Basic Metabolic Panel: Recent Labs  Lab 2018-04-12 0816 04/02/18 0445 04/03/18 0417 04/04/18 0714 04/05/18 0848 04/06/18 0425 04/07/18 0522 04/07/18 0859  NA 134* 138 137 139 137 138 138  --   K 4.0 4.5 4.5 4.3 3.7 4.2 3.8  --   CL 102 96* 96* 94* 88* 91* 93*  --   CO2 24 30 34* 35* 38* 41* 35*  --   GLUCOSE 154* 109* 111* 98 142* 123* 113*  --   BUN 10 10 16 14 7 10 16   --   CREATININE 0.67 0.86 0.78 0.82 0.72 0.80 0.81 0.84  CALCIUM 8.5* 8.8* 8.4* 8.7* 8.6* 8.5* 8.8*  --   MG 1.3* 1.2*  --  1.2*  --  1.6*  --   --    GFR: Estimated Creatinine Clearance: 145.8 mL/min (by C-G formula based on SCr of 0.84 mg/dL). Liver Function Tests: Recent Labs  Lab 04/03/18 0417  AST 25  ALT 24  ALKPHOS 93  BILITOT 1.2  PROT 6.7  ALBUMIN 2.8*   No results for input(s): LIPASE, AMYLASE in the last 168 hours. No results for input(s): AMMONIA in the last 168 hours. Coagulation Profile: No results for input(s): INR, PROTIME in the last 168 hours. Cardiac Enzymes: No results for input(s): CKTOTAL, CKMB, CKMBINDEX, TROPONINI in the last 168 hours. BNP (last 3 results) No results for input(s): PROBNP in the last 8760 hours. HbA1C: No results for input(s): HGBA1C in the last 72 hours. CBG: Recent Labs  Lab 04/06/18 0837 04/06/18 1137 04/06/18 1611 04/06/18 2057 04/07/18 1117  GLUCAP 144* 121* 167* 144* 216*   Lipid Profile: No results for input(s): CHOL, HDL, LDLCALC, TRIG, CHOLHDL, LDLDIRECT in the last 72 hours. Thyroid Function Tests: No results for input(s): TSH, T4TOTAL, FREET4, T3FREE, THYROIDAB in the last 72 hours. Anemia Panel: No results for input(s): VITAMINB12, FOLATE, FERRITIN, TIBC, IRON, RETICCTPCT in the last 72 hours. Sepsis Labs: No results for  input(s): PROCALCITON, LATICACIDVEN in the last 168 hours.  No results found for this or any previous visit (from the past 240 hour(s)).       Radiology Studies: No results found.      Scheduled Meds: . aspirin EC  81 mg Oral Daily  . [START ON 04/08/2018] enoxaparin (LOVENOX) injection  40 mg Subcutaneous Q24H  . folic acid  1 mg Oral Daily  . furosemide  40 mg Intravenous BID  . furosemide  40 mg Oral BID  . insulin aspart  0-15 Units Subcutaneous TID WC  . insulin aspart  0-5 Units Subcutaneous QHS  . mouth rinse  15 mL Mouth Rinse BID  . metoprolol succinate  25 mg Oral Daily  . multivitamin with minerals  1 tablet Oral Daily  . nicotine  14 mg Transdermal QHS  . sacubitril-valsartan  1 tablet Oral BID  . sodium chloride flush  3 mL Intravenous Q12H  . sodium chloride flush  3 mL Intravenous Q12H  .  spironolactone  25 mg Oral Daily  . thiamine  100 mg Oral Daily   Or  . thiamine  100 mg Intravenous Daily   Continuous Infusions: . sodium chloride    . sodium chloride       LOS: 6 days     Alwyn RenElizabeth G Theodor Mustin, MD   If 7PM-7AM, please contact night-coverage [email or web address] Password [email or web address] 04/07/2018, 12:27 PM

## 2018-04-07 NOTE — Progress Notes (Addendum)
Subjective:  Breathing and leg edema significantly improved.  Objective:  Vital Signs in the last 24 hours: Temp:  [97.7 F (36.5 C)-98.4 F (36.9 C)] 97.7 F (36.5 C) (06/04 0542) Pulse Rate:  [0-88] 0 (06/04 0812) Resp:  [15-50] 20 (06/04 0812) BP: (108-124)/(75-93) 115/82 (06/04 0807) SpO2:  [0 %-98 %] 0 % (06/04 0812) Weight:  [120.6 kg (265 lb 12.8 oz)] 120.6 kg (265 lb 12.8 oz) (06/04 0542)  Intake/Output from previous day: 06/03 0701 - 06/04 0700 In: 700 [P.O.:700] Out: 700 [Urine:700] Intake/Output from this shift: No intake/output data recorded.   Net -18L for this admission  Physical Exam: Nursing note and vitals reviewed. Constitutional: He is oriented to person, place, and time. No distress.  Morbidly obese.  Cardiovascular: Regular rhythm, rate. No murmur heard. Respiratory: Effort normal. No respiratory distress. He has no wheezes. He has no rales.  GI: Soft. Bowel sounds are normal. He exhibits no distension. There is no tenderness.  Musculoskeletal: He exhibits trace edema .  Neurological: He is alert and oriented to person, place, and time. No cranial nerve deficit.  Skin: Skin is warm and dry.  Psychiatric: He has a normal mood and affect.      Lab Results: No results for input(s): WBC, HGB, PLT in the last 72 hours. Recent Labs    04/06/18 0425 04/07/18 0522  NA 138 138  K 4.2 3.8  CL 91* 93*  CO2 41* 35*  GLUCOSE 123* 113*  BUN 10 16  CREATININE 0.80 0.81   No results for input(s): TROPONINI in the last 72 hours.  Invalid input(s): CK, MB Hepatic Function Panel No results for input(s): PROT, ALBUMIN, AST, ALT, ALKPHOS, BILITOT, BILIDIR, IBILI in the last 72 hours. No results for input(s): CHOL in the last 72 hours.   EKG 04/02/2018: Sinus tachycardia 107 bpm. Biatrial enlargement. Normal axis, normal conduction. Possible old anteroseptal infarct. No ischemic changes.  Echocardiogram 04/02/2018: Study Conclusions  - Left  ventricle: The cavity size was moderately dilated. Wall thickness was normal. Systolic function was severely reduced. The estimated ejection fraction was in the range of 15% to 20%. There is severe global hypokinesis with no regional differences in the wall motion. Doppler parameters are consistent with a reversible restrictive pattern, indicative of decreased left ventricular diastolic compliance and/or increased left atrial pressure (grade 3 diastolic dysfunction). - Right ventricle: The cavity size was mildly dilated. Systolic function was moderately reduced. - Right atrium: The atrium was mildly dilated. - Tricuspid valve: Mildly dilated annulus. There was mild-moderate regurgitation.  Impressions:  - Dilated cardiomyopathy with severe biventricular failure. Visual LVEF 15-20%. Mild to mod tricuspid regurgitation with elevated central venous pressure. 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  Guideline directed medical therapy for heart failure Alcohol and cocaine cessation.    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 Currently NYHA class III. Alcohol abuse Cocaine abuse Tobacco abuse Suspected severe obstructive sleep apnea Morbid obesity  Recommendations: LVEDP remarkably 24 mmHg in spite of net -ve 18 L IV lasix 40 mg bid for one more. Continue PO lasix 40 mg bid. Continue metoprolol succinate 25 mg daily. Continue Entresto 24-26 mg bid. Continue spironolactone 25  mg daily. Plan on performing right and left heart cath on Tuesday 06/04 am Continue CPAP use.   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 He should wear CPAP whenever he sleeps. Will need formal outpatient sleep study. Continue management of alcohol withdrawal as you're doing. Increase activity as tolerated.   I have discussed patient's diagnosis, prognosis, and management plan at length with patient and his fiance. I have reemphasized that any more alcohol will be deleterious for him. Also emphasized tobacco cessation.  Recommend at least one more day of inpatient hospital stay for further diuresis.   LOS: 6 days    Maceo Hernan J Jeani Fassnacht 04/07/2018, 8:20 AM  Corrin Hingle Emiliano DyerJ Kimbella Heisler, MD [facility] Pager: [phone] Office: [phone] If no answer Cell [phone]

## 2018-04-07 NOTE — Progress Notes (Signed)
PT Cancellation Note  Patient Details Name: [name] MRN: [id number] DOB: [birth year]   Cancelled Treatment:    Reason Eval/Treat Not Completed: Patient at procedure or test/unavailable(cath lab)   [name] B [name] 04/07/2018, 7:11 AM  Delaney MeigsMaija Tabor [name], PT [phone]

## 2018-04-07 NOTE — Progress Notes (Signed)
Physical Therapy Treatment Patient Details Name: Francisco SalisburyMatthew A [name] MRN: [id number] DOB: 1971/01/15 Today's Date: 04/07/2018    History of Present Illness 47 y.o. 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    PT Comments    Pt demonstrated ability to ambulate 400 feet without AD with supervision due to gait quality deficits. Pt also navigated stairs necessary for D/C home. Pt ready to return home but concerned about his ability to give up alcohol as MDs have recommended. Goals updated for balance deficits. Will continue to follow pt in pursuit of independence with balance.  Follow Up Recommendations  No PT follow up     Equipment Recommendations  None recommended by PT    Recommendations for Other Services       Precautions / Restrictions Precautions Precautions: None Precaution Comments: Monitor O2 (remained @90 % with activity on RA this session) Restrictions Weight Bearing Restrictions: No    Mobility  Bed Mobility Overal bed mobility: Independent                Transfers Overall transfer level: Modified independent Equipment used: None             General transfer comment: Increased time required  Ambulation/Gait Ambulation/Gait assistance: Supervision Ambulation Distance (Feet): 400 Feet Assistive device: None Gait Pattern/deviations: Step-through pattern;Trendelenburg;Drifts right/left Gait velocity: decreased   General Gait Details: Pt ambulated 400 ft with no AD and no overt LOB observed. Pt drifts L when ambulating and displays increased sway over LLE during stance phase. Supervision due to gait quality. SpO2% above 90% for most of ambulation. 2 instances of 89% but wuickly returned to >90% after short standing rest break and deep breathing.   Stairs Stairs: Yes Stairs assistance: Modified independent (Device/Increase time) Stair Management: One  rail Right;Alternating pattern Number of Stairs: 3 General stair comments: Pt Mod I with 3 stairs with railing   Wheelchair Mobility    Modified Rankin (Stroke Patients Only)       Balance Overall balance assessment: Mild deficits observed, not formally tested     Sitting balance - Comments: Sitting balance WNL       Standing balance comment: Slightly increased postural sway in static standing                            Cognition Arousal/Alertness: Awake/alert Behavior During Therapy: WFL for tasks assessed/performed Overall Cognitive Status: Within Functional Limits for tasks assessed                                 General Comments: Pt discussed that MDs have told him to stop drinking alcohol. Pt reports being undecided about whether he will be able to stop      Exercises      General Comments        Pertinent Vitals/Pain Pain Assessment: No/denies pain    Home Living                      Prior Function            PT Goals (current goals can now be found in the care plan section) Acute Rehab PT Goals Patient Stated Goal: To go home PT Goal Formulation: With patient Time For Goal Achievement: 04/21/18 Potential to Achieve Goals: Good Additional Goals Additional Goal #1: Pt will score >  44 on Berg Balance Scale Progress towards PT goals: Progressing toward goals    Frequency    Min 2X/week      PT Plan Current plan remains appropriate    Co-evaluation              AM-PAC PT "6 Clicks" Daily Activity  Outcome Measure  Difficulty turning over in bed (including adjusting bedclothes, sheets and blankets)?: None Difficulty moving from lying on back to sitting on the side of the bed? : None Difficulty sitting down on and standing up from a chair with arms (e.g., wheelchair, bedside commode, etc,.)?: A Little Help needed moving to and from a bed to chair (including a wheelchair)?: None Help needed walking in  hospital room?: None Help needed climbing 3-5 steps with a railing? : None 6 Click Score: 23    End of Session Equipment Utilized During Treatment: Gait belt Activity Tolerance: Patient tolerated treatment well Patient left: in chair;with call bell/phone within reach Nurse Communication: Mobility status;Other (comment)(O2 Removal) PT Visit Diagnosis: Other abnormalities of gait and mobility (R26.89)     Time: 4098-1191 PT Time Calculation (min) (ACUTE ONLY): 19 min  Charges:  $Gait Training: 8-22 mins                    G Codes:       Gabe Jaeson Molstad, SPT   [employer] 04/07/2018, 2:35 PM

## 2018-04-07 NOTE — Progress Notes (Signed)
RN placed tegaderm dressing on pt's right radial site.

## 2018-04-07 NOTE — Interval H&P Note (Signed)
History and Physical Interval Note:  04/07/2018 7:30 AM  Francisco SalisburyMatthew A Camilo  has presented today for surgery, with the diagnosis of hf  The various methods of treatment have been discussed with the patient and family. After consideration of risks, benefits and other options for treatment, the patient has consented to  Procedure(s): RIGHT/LEFT HEART CATH AND CORONARY ANGIOGRAPHY (N/A) as a surgical intervention .  The patient's history has been reviewed, patient examined, no change in status, stable for surgery.  I have reviewed the patient's chart and labs.  Questions were answered to the patient's satisfaction.    2012 Appropriate Use Criteria for Diagnostic Catheterization Home / Select Test of Interest Indication for RHC Cardiomyopathies Cardiomyopathies (Right and Left Heart Catheterization OR Right Heart Catheterization Alone With/Wit  Cardiomyopathies  (Right and Left Heart Catheterization OR  Right Heart Catheterization Alone With/Without Left Ventriculography and Coronary Angiography)  Link Here: [email or web address] Indication:  1. Known or suspected cardiomyopathy with or without heart failure A (7) Indication: 93; Score 7      [name]

## 2018-04-08 ENCOUNTER — Inpatient Hospital Stay (HOSPITAL_COMMUNITY): Payer: Self-pay

## 2018-04-08 DIAGNOSIS — I509 Heart failure, unspecified: Secondary | ICD-10-CM

## 2018-04-08 LAB — BASIC METABOLIC PANEL
Anion gap: 8 (ref 5–15)
BUN: 20 mg/dL (ref 6–20)
CHLORIDE: 90 mmol/L — AB (ref 101–111)
CO2: 38 mmol/L — ABNORMAL HIGH (ref 22–32)
Calcium: 8.7 mg/dL — ABNORMAL LOW (ref 8.9–10.3)
Creatinine, Ser: 0.81 mg/dL (ref 0.61–1.24)
GFR calc Af Amer: >60 mL/min (ref >60)
Glucose, Bld: 157 mg/dL — ABNORMAL HIGH (ref 65–99)
Potassium: 3.6 mmol/L (ref 3.5–5.1)
SODIUM: 136 mmol/L (ref 135–145)

## 2018-04-08 LAB — GLUCOSE, CAPILLARY
GLUCOSE-CAPILLARY: 96 mg/dL (ref 65–99)
Glucose-Capillary: 209 mg/dL — ABNORMAL HIGH (ref 65–99)

## 2018-04-08 LAB — TSH: TSH: 2.496 u[IU]/mL (ref 0.350–4.500)

## 2018-04-08 MED ORDER — GLIPIZIDE 2.5 MG HALF TABLET
2.5000 mg | ORAL_TABLET | Freq: Every day | ORAL | Status: DC
  Administered 2018-04-08: 2.5 mg via ORAL
  Filled 2018-04-08: qty 1

## 2018-04-08 MED ORDER — METOPROLOL SUCCINATE ER 25 MG PO TB24: 25.0000 mg | ORAL_TABLET | Freq: Every day | ORAL | 3 refills | Status: DC

## 2018-04-08 MED ORDER — METFORMIN HCL 500 MG PO TABS: 500.0000 mg | ORAL_TABLET | Freq: Two times a day (BID) | ORAL | 0 refills | Status: DC

## 2018-04-08 MED ORDER — SACUBITRIL-VALSARTAN 24-26 MG PO TABS: 1.0000 | ORAL_TABLET | Freq: Two times a day (BID) | ORAL | 1 refills | Status: DC

## 2018-04-08 MED ORDER — COLCHICINE 0.6 MG PO TABS: 0.6000 mg | ORAL_TABLET | Freq: Every day | ORAL | 0 refills | Status: DC

## 2018-04-08 MED ORDER — THIAMINE HCL 100 MG PO TABS: 100.0000 mg | ORAL_TABLET | Freq: Every day | ORAL | 0 refills | Status: AC

## 2018-04-08 MED ORDER — SPIRONOLACTONE 25 MG PO TABS: 25.0000 mg | ORAL_TABLET | Freq: Every day | ORAL | 3 refills | Status: DC

## 2018-04-08 MED ORDER — FUROSEMIDE 40 MG PO TABS: 40.0000 mg | ORAL_TABLET | Freq: Two times a day (BID) | ORAL | 3 refills | Status: DC

## 2018-04-08 MED ORDER — BLOOD GLUCOSE MONITOR KIT: PACK | 0 refills | Status: AC

## 2018-04-08 MED ORDER — FOLIC ACID 1 MG PO TABS: 1.0000 mg | ORAL_TABLET | Freq: Every day | ORAL | 0 refills | Status: AC

## 2018-04-08 MED ORDER — COLCHICINE 0.6 MG PO TABS
1.2000 mg | ORAL_TABLET | Freq: Once | ORAL | Status: AC
  Administered 2018-04-08: 1.2 mg via ORAL
  Filled 2018-04-08: qty 2

## 2018-04-08 MED ORDER — COLCHICINE 0.6 MG PO TABS
0.6000 mg | ORAL_TABLET | Freq: Once | ORAL | Status: AC
  Administered 2018-04-08: 0.6 mg via ORAL

## 2018-04-08 MED ORDER — ADULT MULTIVITAMIN W/MINERALS CH: 1.0000 | ORAL_TABLET | Freq: Every day | ORAL | 0 refills | Status: AC

## 2018-04-08 MED ORDER — GLIPIZIDE 5 MG PO TABS: 2.5000 mg | ORAL_TABLET | Freq: Every day | ORAL | 0 refills | Status: DC

## 2018-04-08 MED FILL — Heparin Sod (Porcine)-NaCl IV Soln 1000 Unit/500ML-0.9%: INTRAVENOUS | Qty: 1000 | Status: AC

## 2018-04-08 NOTE — Progress Notes (Signed)
Subjective:  Breathing and leg edema significantly improved. Complains of left toe pain today.  Objective:  Vital Signs in the last 24 hours: Temp:  [97.3 F (36.3 C)-98 F (36.7 C)] 97.4 F (36.3 C) (06/05 0528) Pulse Rate:  [72-90] 72 (06/05 0528) Resp:  [18] 18 (06/05 0528) BP: (95-123)/(60-94) 103/84 (06/05 0528) SpO2:  [90 %-96 %] 96 % (06/05 0528) Weight:  [121.2 kg (267 lb 3.2 oz)] 121.2 kg (267 lb 3.2 oz) (06/05 0528)  Intake/Output from previous day: 06/04 0701 - 06/05 0700 In: 562 [P.O.:462; IV Piggyback:100] Out: 750 [Urine:750] Intake/Output from this shift: No intake/output data recorded.   Net -18L for this admission  Physical Exam: Nursing note and vitals reviewed. Constitutional: He is oriented to person, place, and time. No distress.  Morbidly obese.  Cardiovascular: Regular rhythm, rate. No murmur heard. Respiratory: Effort normal. No respiratory distress. He has no wheezes. He has no rales.  GI: Soft. Bowel sounds are normal. He exhibits no distension. There is no tenderness.  Musculoskeletal: He exhibits trace edema . No signs of inflammation in left toe. Neurological: He is alert and oriented to person, place, and time. No cranial nerve deficit.  Skin: Skin is warm and dry.  Psychiatric: He has a normal mood and affect.      Lab Results: Recent Labs    04/07/18 0859  WBC 6.5  HGB 14.0  PLT 205   Recent Labs    04/06/18 0425 04/07/18 0522 04/07/18 0859  NA 138 138  --   K 4.2 3.8  --   CL 91* 93*  --   CO2 41* 35*  --   GLUCOSE 123* 113*  --   BUN 10 16  --   CREATININE 0.80 0.81 0.84   No results for input(s): TROPONINI in the last 72 hours.  Invalid input(s): CK, MB Hepatic Function Panel No results for input(s): PROT, ALBUMIN, AST, ALT, ALKPHOS, BILITOT, BILIDIR, IBILI in the last 72 hours. No results for input(s): CHOL in the last 72 hours.   EKG 04/02/2018: Sinus tachycardia 107 bpm. Biatrial enlargement. Normal axis,  normal conduction. Possible old anteroseptal infarct. No ischemic changes.  Echocardiogram 04/02/2018: Study Conclusions  - Left ventricle: The cavity size was moderately dilated. Wall thickness was normal. Systolic function was severely reduced. The estimated ejection fraction was in the range of 15% to 20%. There is severe global hypokinesis with no regional differences in the wall motion. Doppler parameters are consistent with a reversible restrictive pattern, indicative of decreased left ventricular diastolic compliance and/or increased left atrial pressure (grade 3 diastolic dysfunction). - Right ventricle: The cavity size was mildly dilated. Systolic function was moderately reduced. - Right atrium: The atrium was mildly dilated. - Tricuspid valve: Mildly dilated annulus. There was mild-moderate regurgitation.  Impressions:  - Dilated cardiomyopathy with severe biventricular failure. Visual LVEF 15-20%. Mild to mod tricuspid regurgitation with elevated central venous pressure. 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  Guideline directed medical therapy for heart failure Alcohol and cocaine cessation.    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 Currently NYHA class III. Alcohol abuse Cocaine abuse Tobacco abuse Suspected severe obstructive sleep apnea Morbid obesity  Recommendations: Okay to discharge today from heart failure standpoint. Prescriptions sent to pharmacy for the following medications. Metoprolol succinate 25 mg daily. Entresto 24-26 mg bid. Lasix  40 mg bid  I have discussed patient's diagnosis, prognosis, and management plan at length with patient and his fiance. I have reemphasized that any more alcohol will be deleterious for him. Also emphasized tobacco cessation.  Will arrange outpatient follow up.   LOS: 7 days    Manish J Patwardhan 04/08/2018, 8:42 AM  Manish Emiliano DyerJ Patwardhan, MD [facility] Pager: [phone] Office: [phone] If no answer Cell [phone]

## 2018-04-08 NOTE — Progress Notes (Signed)
Attempted to talk to patient about Entresto coupon; patient is in a deep sleep at this time; CM to follow up; B Saks IncorporatedChandler RN,MHA,BSN [phone]

## 2018-04-08 NOTE — Progress Notes (Addendum)
Inpatient Diabetes Program Recommendations  AACE/ADA: New Consensus Statement on Inpatient Glycemic Control (2019)  Target Ranges:  Prepandial:   less than 140 mg/dL      Peak postprandial:   less than 180 mg/dL (1-2 hours)      Critically ill patients:  140 - 180 mg/dL  Results for [name], [name] (MRN [id number]) as of 04/08/2018 10:45  Ref. Range 04/07/2018 11:17 04/07/2018 17:08 04/07/2018 21:21 04/08/2018 07:52  Glucose-Capillary Latest Ref Range: 65 - 99 mg/dL 829216 (H) 562110 (H) 130232 (H) 96   Results for [name], [name] (MRN [id number]) as of 04/08/2018 10:45  Ref. Range 04/01/2018 17:58  Hemoglobin A1C Latest Ref Range: 4.8 - 5.6 % 6.9 (H)   Review of Glycemic Control  Diabetes history: No Outpatient Diabetes medications: NA Current orders for Inpatient glycemic control: Novolog 0-15 units TID with meals, Novolog 0-5 units QHS, Glipizide 2.5 mg QAM  Inpatient Diabetes Program Recommendations: HgbA1C: A1C 6.9% on 04/01/18 indicating new onset DM dx. Patient was seen by Inpatient Diabetes Coordinator on 04/03/18. Will plan to see patient again today to follow up regarding previous DM education. Oral DM medications: Noted patient was started on Glipizide 2.5 mg QAM today.  Addendum 04/08/18@14 :15-Spoke with patient about new diabetes diagnosis. Patient was very drowsy during conversation but participated in conversation appropriately. Patient was able to verbalize some basic information he has been taught about DM.  Reviewed basic pathophysiology of DM Type 2, basic home care, importance of checking CBGs and maintaining good CBG control to prevent long-term and short-term complications. Reviewed glucose and A1C goals.  Reviewed signs and symptoms of hyperglycemia and hypoglycemia along with treatment for both. Discussed impact of nutrition, exercise, stress, sickness, and medications on diabetes control. Discussed Glipizide and how it works for DM control. Patient has Living Well with diabetes booklet at  bedside and has not read any of it yet. Encouraged patient to read through entire book to gain [name] better understanding of DM management. Patient states that he plans to sign up for Medicaid and that he has been set up with [name] follow up appointment for this Friday. Encouraged patient to check his glucose as MD directs and to keep [name] log book of glucose readings and insulin taken. Explained how the doctor he follows up with can use the log book to continue to make DM medication adjustments if needed. RNs to provide ongoing basic DM education at bedside with this patient and engage patient to actively check blood glucose.  Thanks, Orlando PennerMarie Rasheen Bells, RN, MSN, CDE Diabetes Coordinator Inpatient Diabetes Program [phone] (Team Pager from 8am to 5pm)

## 2018-04-08 NOTE — Discharge Summary (Signed)
Physician Discharge Summary  [name] GXQ:[id number] DOB: September 08, 1971 DOA: 04/01/2018  PCP: Patient, No Pcp Per  Admit date: 04/01/2018 Discharge date: 04/08/2018  Time spent: 40 minutes  Recommendations for Outpatient Follow-up:  1. Follow up outpatient CBC/CMP 2. Follow up with cardiology as outpatient.  Ensure he's taking prescribed medications (entresto, lasix, metop, spironolactone). 3. Recommended stopping ritalin with new heart failure (discussed this over phone with him after discharge, he expressed understanding). 4. Ensure pt has established with PCP. 5. Started on glipizide 2.5 mg prior to discharge.  Started metformin as well.  May be able to stop glipizide and just uptitrate metformin as outpatient.  Instructed to follow blood sugars at home and bring list to PCP.  Would benefit from statin and aspirin, but with multiple new medications, holding on adding this for now (would recommend starting as outpatient).  6. First MTP joint pain of LLE.  Started on colchicine.  If not improving with this, would consider further imaging (plain films here with soft tissue swelling and findings suggestive of prior trauma or periosteal reaction), but gout seems most likely based on clinical exam.  Uric acid ordered, but canceled.  Would follow outpatient.  Consider allopurinol after flare.  7. Follow sleep study as outpatient.     8. Continued follow up for polysubstance abuse as outpatient, encourage cessation    Discharge Diagnoses:  Principal Problem:   CHF exacerbation ([name]) Active Problems:   Polysubstance abuse ([name])   Tobacco dependence   Elevated blood pressure reading   Hyperglycemia   Discharge Condition: stable  Diet recommendation: heart healthy/carb consistent  Filed Weights   04/06/18 0433 04/07/18 0542 04/08/18 0528  Weight: 121.6 kg (268 lb 1.6 oz) 120.6 kg (265 lb 12.8 oz) 121.2 kg (267 lb 3.2 oz)    History of present illness:  46 y.o.malewithno significant  pastmedical historypresenting withSOB. 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 He has noticed SOB. It kept getting progressively worse. He was struggling for air and decided to come to the ER. Symptoms started about 2 weeks ago. He has had progressive edema of his feet and legs as well as his abdomen for that same period of time. +orthopnea. +PND. +cough productive of "darker, like brown" sputum. Denies chest pain. His right knee has been hurting with swelling, hurts to walk.  ED Course:New CHF. Edema to proximal thighs. Chest pressure with orthopnea. Symptoms for [name] few weeks. Tachypnea and mild tachycardia. Pleural effusion on right, interstitial edema. Elevated BNP. Negative troponin.  He was found to have new onset heart failure with EF 15-20%.  He was seen by cardiology and was cath'd with mild nonobstructive CAD, dilated nonischemic cardiomyopathy, and pulmonary hypertension.  Started on lasix, metop, spironolactone and entresto.  Encouraged cessation of illicit substances.  Also with diabetes and started on glipizide and metformin.   Hospital Course:  1]new onset CHF - EF 15-20%, mod pulm HTN (see echo).  Cath on 6/4 with mild nonobstructive CAD and dilated nonischemic cardiomyopathy as well as pulmonary hypertension.  Diuresed with improvement in symptoms to 267 lbs on the day of discharge.  Discharged on entresto, lasix, spironolactone, and metop.  Plan to follow up with cardiology as outpatient.     2]hypertension Continue Lasix, spironolactone, entresto, metop.  Improved.  type 2 diabetes- newly diagnosed.  Start metformin.  Start glipizide.  Uptitrate metformin as tolerated.  Maybe able to d/c glipizide.  Holding starting ASA/statin now  with multiple other new drugs started here.  Will need to be started as outpatient.   4]hypomagnesemiarepleted  5]polysubstance abuse/alcohol abuse/tobacco abuse-patient  reports that he does use marijuana plus alcohol on Yannis Broce daily basis.  Also admitted to cocaine use. Discussed with him the importance of stopping alcohol tobacco and illegal drugs.  6]Undiagnosed sleep apnea patient will need sleep test as an outpatient  # Gout: pt with L MTP pain, imaging findings without radiographic evidence of gout.  Notable for "probable posttraumatic deformity with callus formation of 2nd, third, and fourth metatarsal bones", but pt denies any trauma to this foot (alternatively could represent periosteal reaction).  Also notable for diffuse dorsal soft tissue swelling.  With exam typical for gout, will start with colchicine and continue to monitor (?mild improvement after first dose).  Discussed importance of follow up if he does not continue to improve with colchicine as he may need additional imaging/further workup.   Procedures: Cath Mild nonobstructive CAD. Dilated nonischemic cardiomyopathy [name] pulmonary hypertension.  Guideline directed medical therapy for heart failure Alcohol and cocaine cessation.  Francisco Mormon, MD [facility] Pager: [phone] Office: [phone]   Echo Study Conclusions  - Left ventricle: The cavity size was moderately dilated. Wall   thickness was normal. Systolic function was severely reduced. The   estimated ejection fraction was in the range of 15% to 20%. There   is severe global hypokinesis with no regional differences in the   wall motion. Doppler parameters are consistent with [name] reversible   restrictive pattern, indicative of decreased left ventricular   diastolic compliance and/or increased left atrial pressure (grade   3 diastolic dysfunction). - Right ventricle: The cavity size was mildly dilated. Systolic   function was moderately reduced. - Right atrium: The atrium was mildly dilated. - Tricuspid valve: Mildly dilated annulus. There was mild-moderate    regurgitation.  Impressions:  - Dilated cardiomyopathy with severe biventricular failure. Visual   LVEF 15-20%.   Mild to mod tricuspid regurgitation with elevated central venous   pressure.   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  Consultations:  cardiology  Discharge Exam: Vitals:   04/08/18 1148 04/08/18 1151  BP: 91/67 (!) 97/56  Pulse: 78 76  Resp:  20  Temp: 97.6 F (36.4 C)   SpO2: (!) 86% 91%   C/o L foot pain.  Suspects it's gout, which he's had before.  Otherwise feeling better. 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  General: No acute distress. Cardiovascular: Heart sounds show Io Dieujuste regular rate, and rhythm. No gallops or rubs. No murmurs. No JVD. Lungs: Clear to auscultation bilaterally with good air movement. No rales, rhonchi or wheezes. Abdomen: Soft, nontender, nondistended with normal active bowel sounds. No masses. No hepatosplenomegaly. Neurological: Alert and oriented 3. Moves all extremities 4 with equal strength. Cranial nerves II through XII grossly intact. Skin: Warm and dry. No rashes or lesions. Extremities: TTP at L MTP joint.  Some swelling across dorsum of foot, but does not appear c/w cellulitis Psychiatric: Mood and affect are normal. Insight and judgment are appropirate.  Discharge Instructions   Discharge Instructions    (HEART FAILURE PATIENTS) Call MD:  Anytime you have any of the following symptoms: 1) 3 pound weight gain in 24 hours or 5 pounds in 1 week 2) shortness of breath, with or without Jasnoor Trussell dry hacking cough 3) swelling in the hands, feet or stomach 4) if you have to sleep on extra pillows at night  in order to breathe.   Complete by:  As directed    (HEART FAILURE PATIENTS) Call MD:  Anytime you have any of the following symptoms: 1) 3 pound weight gain in 24 hours or 5 pounds in 1 week 2) shortness of breath, with or without Wilfred Siverson dry hacking cough 3) swelling in the hands, feet or stomach 4) if you have to sleep  on extra pillows at night in order to breathe.   Complete by:  As directed    Avoid straining   Complete by:  As directed    Call MD for:  difficulty breathing, headache or visual disturbances   Complete by:  As directed    Call MD for:  persistant dizziness or light-headedness   Complete by:  As directed    Call MD for:  persistant nausea and vomiting   Complete by:  As directed    Call MD for:  redness, tenderness, or signs of infection (pain, swelling, redness, odor or green/yellow discharge around incision site)   Complete by:  As directed    Call MD for:  severe uncontrolled pain   Complete by:  As directed    Call MD for:  temperature >100.4   Complete by:  As directed    Diet - low sodium heart healthy   Complete by:  As directed    Diet - low sodium heart healthy   Complete by:  As directed    Diet Carb Modified   Complete by:  As directed    Discharge instructions   Complete by:  As directed    You were seen for heart failure.  We diuresed you and started you on new medications (lasix, entresto, metoprolol, and spironolactone).  Please take these medications daily.  Please follow up with cardiology as scheduled.  You should have repeat chest x ray as an outpatient.   You have diabetes.  We discharged you on low dose glipizide and metformin.  Check your blood sugars twice daily and bring these results to your PCP.  Watch for low blood sugars.  If you have low blood sugars, stop the glipizide.  Continue the aspirin.  Ask your PCP about starting Gini Caputo statin (I won't start this today because of all of the other new medicines we've started).  We started you on colchicine for suspected gout.  Please follow up with your PCP.  If your foot pain does not improve with the colchicine, you will likely need additional imaging of your foot so please follow up with your PCP within Kaseem Vastine few days to follow this up.  If your pain improves on the colchicine, you can stop this (stop it about 2 days  after the pain has resolved).  Discuss long term options for gout with your PCP.   Follow up with [facility] Gambone sleep study as an outpatient.   Return for new, recurrent, or worsening symptoms.  Please ask your PCP to request records from this hospitalization so they know what was done and what the next steps will be.   Heart Failure patients record your daily weight using the same scale at the same time of day   Complete by:  As directed    Increase activity slowly   Complete by:  As directed    Increase activity slowly   Complete by:  As directed    STOP any activity that causes chest pain, shortness of breath, dizziness, sweating, or exessive weakness   Complete by:  As directed  Allergies as of 04/08/2018   No Known Allergies     Medication List    STOP taking these medications   methylphenidate 20 MG tablet Commonly known as:  RITALIN     TAKE these medications   alprazolam 2 MG tablet Commonly known as:  XANAX Take 2 mg by mouth 2 (two) times daily.   blood glucose meter kit and supplies Kit Dispense based on patient and insurance preference. Use up to four times daily as directed. (FOR ICD-9 250.00, 250.01).   colchicine 0.6 MG tablet Take 1 tablet (0.6 mg total) by mouth daily for 14 days. (until suspected gout flare resolves)   folic acid 1 MG tablet Commonly known as:  FOLVITE Take 1 tablet (1 mg total) by mouth daily. Start taking on:  04/09/2018   furosemide 40 MG tablet Commonly known as:  LASIX Take 1 tablet (40 mg total) by mouth 2 (two) times daily.   glipiZIDE 5 MG tablet Commonly known as:  GLUCOTROL Take 0.5 tablets (2.5 mg total) by mouth daily before breakfast. Start taking on:  04/09/2018   metFORMIN 500 MG tablet Commonly known as:  GLUCOPHAGE Take 1 tablet (500 mg total) by mouth 2 (two) times daily with Chai Verdejo meal.   metoprolol succinate 25 MG 24 hr tablet Commonly known as:  TOPROL-XL Take 1 tablet (25 mg total) by mouth daily.   multivitamin with  minerals Tabs tablet Take 1 tablet by mouth daily. Start taking on:  04/09/2018   sacubitril-valsartan 24-26 MG Commonly known as:  ENTRESTO Take 1 tablet by mouth 2 (two) times daily.   spironolactone 25 MG tablet Commonly known as:  ALDACTONE Take 1 tablet (25 mg total) by mouth daily.   thiamine 100 MG tablet Take 1 tablet (100 mg total) by mouth daily. Start taking on:  04/09/2018      No Known Allergies Follow-up Information    [facility].   Why:  You can call on Monday to set up appointment without insurance Contact information: [place] [address] [phone]       Francisco Mormon, MD. Go on 04/10/2018.   Specialty:  Cardiology Why:  3:45 pm hospital follow up appt Contact information: [place] [address] [phone]            The results of significant diagnostics from this hospitalization (including imaging, microbiology, ancillary and laboratory) are listed below for reference.    Significant Diagnostic Studies: Dg Chest 2 View  Result Date: 04/01/2018 CLINICAL DATA:  47 y/o  M; 1 week of shortness of breath and cough. EXAM: CHEST - 2 VIEW COMPARISON:  07/23/2006 chest radiograph FINDINGS: Mild cardiomegaly. Reticular opacities of the lungs. Moderate right pleural effusion. No pneumothorax. Bones are unremarkable. IMPRESSION: Mild cardiomegaly and moderate right pleural effusion. Reticular opacities may represent interstitial edema or atypical pneumonia. Electronically Signed   By: Kristine Garbe M.D.   On: 04/01/2018 03:48   Dg Foot 2 Views Left  Result Date: 04/08/2018 CLINICAL DATA:  First metatarsophalangeal joint pain for 3 days. EXAM: LEFT FOOT - 2 VIEW COMPARISON:  None. FINDINGS: There is no evidence of acute fracture or dislocation. Probable posttraumatic deformity with callus formation of the second, third and fourth metatarsal bones. No  evidence of osseous abnormalities at the first metatarsophalangeal joint. Diffuse dorsal soft tissue swelling of the mid and forefoot. IMPRESSION: No radiographic evidence of gout. Probable posttraumatic deformity with callus  formation of the second, third and fourth metatarsal bones. Alternatively this may represent an exuberant periosteal reaction. Please correlate to patient's prior history regarding prior trauma. Diffuse dorsal soft tissue swelling of the mid and forefoot. Electronically Signed   By: Fidela Salisbury M.D.   On: 04/08/2018 11:38    Microbiology: No results found for this or any previous visit (from the past 240 hour(s)).   Labs: Basic Metabolic Panel: Recent Labs  Lab 04/02/18 0445  04/04/18 4159 04/05/18 0848 04/06/18 0425 04/07/18 0522 04/07/18 0859 04/08/18 1043  NA 138   < > 139 137 138 138  --  136  K 4.5   < > 4.3 3.7 4.2 3.8  --  3.6  CL 96*   < > 94* 88* 91* 93*  --  90*  CO2 30   < > 35* 38* 41* 35*  --  38*  GLUCOSE 109*   < > 98 142* 123* 113*  --  157*  BUN 10   < > _0 --  20  CREATININE 0.86   < > 0.82 0.72 0.80 0.81 0.84 0.81  CALCIUM 8.8*   < > 8.7* 8.6* 8.5* 8.8*  --  8.7*  MG 1.2*  --  1.2*  --  1.6*  --   --   --    < > = values in this interval not displayed.   Liver Function Tests: Recent Labs  Lab 04/03/18 0417  AST 25  ALT 24  ALKPHOS 93  BILITOT 1.2  PROT 6.7  ALBUMIN 2.8*   No results for input(s): LIPASE, AMYLASE in the last 168 hours. No results for input(s): AMMONIA in the last 168 hours. CBC: Recent Labs  Lab 04/02/18 1758 04/03/18 0417 04/07/18 0859  WBC 8.0 7.1 6.5  NEUTROABS 5.4 4.3  --   HGB 14.1 13.5 14.0  HCT 46.6 46.0 47.1  MCV 92.5 93.5 92.5  PLT 236 233 205   Cardiac Enzymes: No results for input(s): CKTOTAL, CKMB, CKMBINDEX, TROPONINI in the last 168 hours. BNP: BNP (last 3 results) Recent Labs    04/01/18 0908  BNP 814.4*    ProBNP (last 3 results) No results for input(s): PROBNP in  the last 8760 hours.  CBG: Recent Labs  Lab 04/07/18 1117 04/07/18 1708 04/07/18 2121 04/08/18 0752 04/08/18 1127  GLUCAP 216* 110* 232* 96 209*       Signed:  Fayrene Helper MD.  [facility] 04/08/2018, 8:17 PM

## 2018-04-08 NOTE — Care Management Note (Signed)
Case Management Note  Patient Details  Name: Francisco SalisburyMatthew A Tuazon MRN: [id number] Date of Birth: 12-17-1970  Subjective/Objective:  CHF                 Action/Plan: CM talked to patient at the bedside, [facility]; pharmacy of choice is [facility]; he has a discount medication card; Entresto coupon card given to patient with explanation of usage. CM also encouraged patient to try to make life style changes; emotional support given.  Expected Discharge Date:  04/08/18               Expected Discharge Plan:  Home/Self Care  In-House Referral: Financial Counselor  Status of Service:  Completed, signed off  Reola MosherChandler, Rajanee Schuelke L, RN,MHA,BSN [phone] 04/08/2018, 2:44 PM

## 2018-04-08 NOTE — Progress Notes (Signed)
Patient ready for discharge. Girlfriend here to provided transport to home. All d/c instructions, meds, blood sugar testing and equipment reviewed with patient.  Diabetic Educator saw patient and made recommendation for which meter to purchase. All personal belongings with pt.  Discharge meds reviewed and rx meds reviewed with pt to pick up at his drugstore

## 2018-04-08 NOTE — Progress Notes (Signed)
Ok to discharge from heart failure standpoint. [facility] Please ensure patient gets coupon on discharge [facility]. Will arrange outpatient follow up.  Elder NegusManish J Patwardhan, MD [facility] Pager: [phone] Office: [phone] If no answer Cell [phone]

## 2018-12-22 ENCOUNTER — Telehealth: Payer: Self-pay

## 2018-12-22 MED ORDER — FUROSEMIDE 40 MG PO TABS: 40.0000 mg | ORAL_TABLET | Freq: Two times a day (BID) | ORAL | 3 refills | Status: DC

## 2018-12-22 MED ORDER — SPIRONOLACTONE 25 MG PO TABS: 25.0000 mg | ORAL_TABLET | Freq: Every day | ORAL | 3 refills | Status: DC

## 2018-12-22 MED ORDER — METOPROLOL SUCCINATE ER 25 MG PO TB24: 25.0000 mg | ORAL_TABLET | Freq: Every day | ORAL | 3 refills | Status: DC

## 2018-12-22 MED ORDER — METFORMIN HCL 500 MG PO TABS: 500.0000 mg | ORAL_TABLET | Freq: Two times a day (BID) | ORAL | 1 refills | Status: DC

## 2019-03-08 ENCOUNTER — Other Ambulatory Visit: Payer: Self-pay

## 2019-03-08 DIAGNOSIS — R739 Hyperglycemia, unspecified: Secondary | ICD-10-CM

## 2019-03-08 DIAGNOSIS — I10 Essential (primary) hypertension: Secondary | ICD-10-CM

## 2019-03-10 ENCOUNTER — Other Ambulatory Visit: Payer: Self-pay

## 2019-03-11 ENCOUNTER — Telehealth: Payer: Self-pay

## 2019-03-11 NOTE — Telephone Encounter (Signed)
Pt called and wanted a refill on his metformin

## 2019-03-15 ENCOUNTER — Telehealth: Payer: Self-pay

## 2019-03-15 NOTE — Telephone Encounter (Signed)
Pt wants to know if you can give any recommendations for pcp

## 2019-03-24 ENCOUNTER — Other Ambulatory Visit (HOSPITAL_COMMUNITY): Payer: Self-pay | Admitting: Cardiology

## 2019-03-25 ENCOUNTER — Other Ambulatory Visit: Payer: Self-pay | Admitting: Cardiology

## 2019-03-25 DIAGNOSIS — E119 Type 2 diabetes mellitus without complications: Secondary | ICD-10-CM

## 2019-03-25 LAB — BASIC METABOLIC PANEL
BUN/Creatinine Ratio: 18 (ref 9–20)
BUN: 17 mg/dL (ref 6–24)
CO2: 23 mmol/L (ref 20–29)
Calcium: 9.8 mg/dL (ref 8.7–10.2)
Chloride: 101 mmol/L (ref 96–106)
Creatinine, Ser: 0.97 mg/dL (ref 0.76–1.27)
GFR calc Af Amer: 106 mL/min/{1.73_m2} (ref >59)
GFR calc non Af Amer: 92 mL/min/{1.73_m2} (ref >59)
Glucose: 103 mg/dL — ABNORMAL HIGH (ref 65–99)
Potassium: 4.5 mmol/L (ref 3.5–5.2)
Sodium: 140 mmol/L (ref 134–144)

## 2019-03-25 MED ORDER — METFORMIN HCL 500 MG PO TABS: 500.0000 mg | ORAL_TABLET | Freq: Two times a day (BID) | ORAL | 1 refills | Status: DC

## 2019-04-05 ENCOUNTER — Encounter: Payer: Self-pay | Admitting: Cardiology

## 2019-04-05 ENCOUNTER — Ambulatory Visit (INDEPENDENT_AMBULATORY_CARE_PROVIDER_SITE_OTHER): Payer: Self-pay | Admitting: Cardiology

## 2019-04-05 ENCOUNTER — Other Ambulatory Visit: Payer: Self-pay

## 2019-04-05 VITALS — Ht 73.0 in | Wt 245.0 lb

## 2019-04-05 DIAGNOSIS — E119 Type 2 diabetes mellitus without complications: Secondary | ICD-10-CM | POA: Insufficient documentation

## 2019-04-05 DIAGNOSIS — I5022 Chronic systolic (congestive) heart failure: Secondary | ICD-10-CM

## 2019-04-05 MED ORDER — METFORMIN HCL 500 MG PO TABS: 500.0000 mg | ORAL_TABLET | Freq: Two times a day (BID) | ORAL | 1 refills | Status: DC

## 2019-04-05 NOTE — Progress Notes (Signed)
   Telephone visit note  Subjective:   [name], male    DOB: 11/15/1970, 48 y.o.   MRN: [id number]   I connected with the patient on 04/05/19 by a telephone call and verified that I am speaking with the correct person using two identifiers.     This visit type was conducted due to national recommendations for restrictions regarding the COVID-19 Pandemic (e.g. social distancing).  This format is felt to be most appropriate for this patient at this time.  All issues noted in this document were discussed and addressed.  No physical exam was performed (except for noted visual exam findings with Tele health visits).  The patient has consented to conduct a Tele health visit and understands insurance will be billed.   48 year old Caucasian male with polysubstance abuse including alcohol, tobacco, cocaine, now with nonishcemic dilated cardiomyopathy.  Echocardiogram 08/11/2018: Left ventricle cavity is normal in size. Mild concentric hypertrophy of the left ventricle. Mild decrease in global wall motion. Visual EF is 45-50%. Normal diastolic filling pattern. Calculated EF 44%. Mild tricuspid regurgitation.  No evidence of pulmonary hypertension. Compared to previous hospital study on 04/02/2018, there is remarkable improvement in systolic, diastolic function, as well as pulmonary hypertension.  Cardiomyopathy: NYHA class II. Continue Entresto to 49-51 mg twice daily. Will check regarding coverage, as he has been approved for patient assistance program through 04/2019 Continue metoprolol succinate 25 mg daily, Continue spironolactone 12.5 mg daily. Continue lasix  40 mg once daily Continue corlanor 5 mg twice daily.   Type 2 DM: Continue metformin Need to establish care with PCP  H/o polysubstance abuse: Reemphasized abstinence from alcohol, cocaine,  F/u in 6 months  Time spent: 15 min.  Elder Negus, MD [facility] Pager: [phone] Office:  [phone] If no answer Cell [phone]

## 2019-05-26 ENCOUNTER — Other Ambulatory Visit: Payer: Self-pay

## 2019-05-26 DIAGNOSIS — I5023 Acute on chronic systolic (congestive) heart failure: Secondary | ICD-10-CM

## 2019-05-26 MED ORDER — ENTRESTO 49-51 MG PO TABS: 1.0000 | ORAL_TABLET | Freq: Two times a day (BID) | ORAL | 12 refills | Status: DC

## 2019-07-06 ENCOUNTER — Other Ambulatory Visit: Payer: Self-pay | Admitting: Cardiology

## 2019-07-06 DIAGNOSIS — E119 Type 2 diabetes mellitus without complications: Secondary | ICD-10-CM

## 2019-07-09 ENCOUNTER — Encounter: Payer: Self-pay | Admitting: Cardiology

## 2019-07-09 ENCOUNTER — Ambulatory Visit: Payer: Self-pay | Admitting: Cardiology

## 2019-07-09 ENCOUNTER — Ambulatory Visit (INDEPENDENT_AMBULATORY_CARE_PROVIDER_SITE_OTHER): Payer: Self-pay | Admitting: Cardiology

## 2019-07-09 ENCOUNTER — Other Ambulatory Visit: Payer: Self-pay

## 2019-07-09 VITALS — BP 115/61 | HR 73 | Ht 72.0 in | Wt 249.4 lb

## 2019-07-09 DIAGNOSIS — I5022 Chronic systolic (congestive) heart failure: Secondary | ICD-10-CM

## 2019-07-09 DIAGNOSIS — I10 Essential (primary) hypertension: Secondary | ICD-10-CM

## 2019-07-09 DIAGNOSIS — E119 Type 2 diabetes mellitus without complications: Secondary | ICD-10-CM

## 2019-07-09 NOTE — Telephone Encounter (Signed)
Do you prescribe this?

## 2019-07-09 NOTE — Progress Notes (Signed)
Follow up visit  Subjective:   [name], male    DOB: February 01, 1971, 48 y.o.   MRN: [id number]   Chief Complaint  Patient presents with  . Congestive Heart Failure  . Follow-up    30mo   HPI  48year old Caucasian male with hypertension, type 2 DM, nonishcemic dilated cardiomyopathy, now with recovered LVEF to 45-50%.  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 Patient has now been abstinent from alcohol, with near normal EF. He is compliant with his medical therapy.  Past Medical History:  Diagnosis Date  . CHF (congestive heart failure) ([place]   . [place] without complication ([place]   . Diverticulitis      Past Surgical History:  Procedure Laterality Date  . COLECTOMY     for diverticulitis  . RIGHT/LEFT HEART CATH AND CORONARY ANGIOGRAPHY N/A 04/07/2018   Procedure: RIGHT/LEFT HEART CATH AND CORONARY ANGIOGRAPHY;  Surgeon: PNigel Mormon MD;  [facility];  Service: Cardiovascular;  Laterality: N/A;     Social History   Socioeconomic History  . Marital status: Single    Spouse name: Not on file  . Number of children: 1  . Years of education: Not on file  . Highest education level: Not on file  Occupational History  . Not on file  Social Needs  . Financial resource strain: Not on file  . Food insecurity    Worry: Not on file    Inability: Not on file  . Transportation needs    Medical: Not on file    Non-medical: Not on file  Tobacco Use  . Smoking status: Current Some Day Smoker    Packs/day: 0.25    Years: 32.00    Pack years: 8.00    Types: Cigarettes  . Smokeless tobacco: Never Used  Substance and Sexual Activity  . Alcohol use: Yes    Comment:  a few shots and a beer monthly  . Drug use: Yes    Types: Marijuana    Comment: marijuana 1-2 times per week; reluctantly admits to cocaine with last use maybe 1 month ago  . Sexual activity: Not on file  Lifestyle  . Physical activity    Days  per week: Not on file    Minutes per session: Not on file  . Stress: Not on file  Relationships  . Social [place]: Not on file    Gets together: Not on file    Attends religious service: Not on file    Active member of club or organization: Not on file    Attends meetings of clubs or organizations: Not on file    Relationship status: Not on file  . Intimate partner violence    Fear of current or ex partner: Not on file    Emotionally abused: Not on file    Physically abused: Not on file    Forced sexual activity: Not on file  Other Topics Concern  . Not on file  Social History Narrative  . Not on file     Family History  Problem Relation Age of Onset  . Heart disease Father   . Congestive Heart Failure Neg Hx      Current Outpatient Medications on File Prior to Visit  Medication Sig Dispense Refill  . alprazolam (XANAX) 2 MG tablet Take 2 mg by mouth as needed.   5  . furosemide (LASIX) 40 MG tablet Take 1 tablet (40 mg  total) by mouth 2 (two) times daily. (Patient taking differently: Take 40 mg by mouth daily. ) 60 tablet 3  . ivabradine (CORLANOR) 5 MG TABS tablet Take 5 mg by mouth 2 (two) times daily with a meal.    . metFORMIN (GLUCOPHAGE) 500 MG tablet Take 1 tablet (500 mg total) by mouth 2 (two) times daily with a meal for 30 days. 120 tablet 1  . methylphenidate (RITALIN) 20 MG tablet 2 (two) times a day.    . metoprolol succinate (TOPROL-XL) 25 MG 24 hr tablet Take 1 tablet (25 mg total) by mouth daily. 60 tablet 3  . sacubitril-valsartan (ENTRESTO) 49-51 MG Take 1 tablet by mouth 2 (two) times daily. 180 tablet 12  . spironolactone (ALDACTONE) 25 MG tablet Take 1 tablet (25 mg total) by mouth daily. 60 tablet 3  . blood glucose meter kit and supplies KIT Dispense based on patient and insurance preference. Use up to four times daily as directed. (FOR ICD-9 250.00, 250.01). 1 each 0   No current facility-administered medications on file prior  to visit.     Cardiovascular studies:  EKG 07/09/2019: Sinus rhythm 70 bpm. Normal EKG.  Echocardiogram 08/11/2018: Left ventricle cavity is normal in size. Mild concentric hypertrophy of the left ventricle. Mild decrease in global wall motion. Visual EF is 45-50%. Normal diastolic filling pattern. Calculated EF 44%. Mild tricuspid regurgitation.  No evidence of pulmonary hypertension. Compared to previous hospital study on 04/02/2018, there is remarkable improvement in systolic, diastolic function, as well as pulmonary hypertension.   Recent labs: Results for [name] (MRN [id number]) as of 07/09/2019 14:52  Ref. Range 03/24/2019 14:27  Sodium Latest Ref Range: 134 - 144 mmol/L 140  Potassium Latest Ref Range: 3.5 - 5.2 mmol/L 4.5  Chloride Latest Ref Range: 96 - 106 mmol/L 101  CO2 Latest Ref Range: 20 - 29 mmol/L 23  Glucose Latest Ref Range: 65 - 99 mg/dL 103 (H)  BUN Latest Ref Range: 6 - 24 mg/dL 17  Creatinine Latest Ref Range: 0.76 - 1.27 mg/dL 0.97  Calcium Latest Ref Range: 8.7 - 10.2 mg/dL 9.8  BUN/Creatinine Ratio Latest Ref Range: 9 - 20  18  GFR, Est Non African American Latest Ref Range: >59 mL/min/1.73 92  GFR, Est African American Latest Ref Range: >59 mL/min/1.73 106   Results for [name] (MRN [id number]) as of 07/09/2019 14:52  Ref. Range 04/07/2018 08:59  WBC Latest Ref Range: 4.0 - 10.5 K/uL 6.5  RBC Latest Ref Range: 4.22 - 5.81 MIL/uL 5.09  Hemoglobin Latest Ref Range: 13.0 - 17.0 g/dL 14.0  HCT Latest Ref Range: 39.0 - 52.0 % 47.1  MCV Latest Ref Range: 78.0 - 100.0 fL 92.5  MCH Latest Ref Range: 26.0 - 34.0 pg 27.5  MCHC Latest Ref Range: 30.0 - 36.0 g/dL 29.7 (L)  RDW Latest Ref Range: 11.5 - 15.5 % 15.3  Platelets Latest Ref Range: 150 - 400 K/uL 205    Review of Systems  Constitution: Negative for decreased appetite, malaise/fatigue, weight gain and weight loss.  HENT: Negative for congestion.   Eyes: Negative for visual disturbance.   Cardiovascular: Negative for chest pain, dyspnea on exertion, leg swelling, palpitations and syncope.  Respiratory: Negative for cough.   Endocrine: Negative for cold intolerance.  Hematologic/Lymphatic: Does not bruise/bleed easily.  Skin: Negative for itching and rash.  Musculoskeletal: Negative for myalgias.  Gastrointestinal: Negative for abdominal pain, nausea and vomiting.  Genitourinary: Negative for dysuria.  Neurological: Negative  for dizziness and weakness.  Psychiatric/Behavioral: The patient is not nervous/anxious.   All other systems reviewed and are negative.        Vitals:   07/09/19 1441  BP: 115/61  Pulse: 73  SpO2: 97%    Body mass index is 33.82 kg/m. Filed Weights   07/09/19 1441  Weight: 249 lb 6.4 oz (113.1 kg)    Objective:   Physical Exam  Constitutional: He is oriented to person, place, and time. He appears well-developed and well-nourished. No distress.  HENT:  Head: Normocephalic and atraumatic.  Eyes: Pupils are equal, round, and reactive to light. Conjunctivae are normal.  Neck: No JVD present.  Cardiovascular: Normal rate, regular rhythm and intact distal pulses.  Pulmonary/Chest: Effort normal and breath sounds normal. He has no wheezes. He has no rales.  Abdominal: Soft. Bowel sounds are normal. There is no rebound.  Musculoskeletal:        General: No edema.  Lymphadenopathy:    He has no cervical adenopathy.  Neurological: He is alert and oriented to person, place, and time. No cranial nerve deficit.  Skin: Skin is warm and dry.  Psychiatric: He has a normal mood and affect.  Nursing note and vitals reviewed.         Assessment & Recommendations:   48 year old Caucasian male with hypertension, type 2 DM, nonishcemic dilated cardiomyopathy, now with recovered LVEF to 45-50%.  Cardiomyopathy: NYHA class I-II. Continue Entresto to 49-51 mg twice daily, metoprolol succinate 25 mg daily, spironolactone 25 mg daily, lasix  40 mg  once daily Continue corlanor 5 mg twice daily. Will need patient assistance.  Type 2 DM: Continue metformin Strongly emphasized need to establish care with PCP  H/o polysubstance abuse: Congratulated on abstinence from alcohol, cocaine,  F/u in 6 months   Lawrence, MD [facility] Pager: [phone] Office: [phone] If no answer Cell [phone]

## 2019-09-05 ENCOUNTER — Other Ambulatory Visit: Payer: Self-pay | Admitting: Cardiology

## 2019-10-27 ENCOUNTER — Other Ambulatory Visit: Payer: Self-pay | Admitting: Cardiology

## 2019-10-27 DIAGNOSIS — E119 Type 2 diabetes mellitus without complications: Secondary | ICD-10-CM

## 2019-11-01 ENCOUNTER — Other Ambulatory Visit: Payer: Self-pay

## 2020-01-07 ENCOUNTER — Other Ambulatory Visit (HOSPITAL_COMMUNITY): Payer: Self-pay | Admitting: Cardiology

## 2020-01-07 ENCOUNTER — Other Ambulatory Visit: Payer: Self-pay

## 2020-01-07 ENCOUNTER — Encounter: Payer: Self-pay | Admitting: Cardiology

## 2020-01-07 ENCOUNTER — Ambulatory Visit: Payer: Self-pay | Admitting: Cardiology

## 2020-01-07 VITALS — BP 123/66 | HR 72 | Temp 93.8°F | Ht 72.0 in | Wt 228.0 lb

## 2020-01-07 DIAGNOSIS — I5023 Acute on chronic systolic (congestive) heart failure: Secondary | ICD-10-CM

## 2020-01-07 DIAGNOSIS — E119 Type 2 diabetes mellitus without complications: Secondary | ICD-10-CM

## 2020-01-07 DIAGNOSIS — I10 Essential (primary) hypertension: Secondary | ICD-10-CM

## 2020-01-07 DIAGNOSIS — I5022 Chronic systolic (congestive) heart failure: Secondary | ICD-10-CM

## 2020-01-07 MED ORDER — SPIRONOLACTONE 25 MG PO TABS: 25.0000 mg | ORAL_TABLET | Freq: Every day | ORAL | 3 refills | Status: DC

## 2020-01-07 MED ORDER — FUROSEMIDE 40 MG PO TABS: 40.0000 mg | ORAL_TABLET | ORAL | 3 refills | Status: DC | PRN

## 2020-01-07 MED ORDER — IVABRADINE HCL 5 MG PO TABS: 5.0000 mg | ORAL_TABLET | Freq: Two times a day (BID) | ORAL | 3 refills | Status: AC

## 2020-01-07 MED ORDER — ENTRESTO 49-51 MG PO TABS: 1.0000 | ORAL_TABLET | Freq: Two times a day (BID) | ORAL | 3 refills | Status: DC

## 2020-01-07 MED ORDER — METOPROLOL SUCCINATE ER 25 MG PO TB24: 25.0000 mg | ORAL_TABLET | Freq: Every day | ORAL | 3 refills | Status: DC

## 2020-01-07 NOTE — Progress Notes (Signed)
Follow up visit  Subjective:   [name], male    DOB: 06-29-1971, 49 y.o.   MRN: [id number]   Chief Complaint  Patient presents with  . Congestive Heart Failure  . Follow-up    HPI  49 year old Caucasian male with hypertension, type 2 DM, nonishcemic dilated cardiomyopathy, now with recovered LVEF to 45-50%.  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 Patient has now been abstinent from alcohol, with near normal EF. He is compliant with his medical therapy. He still does not have any PCP, and does not have insurance.    Current Outpatient Medications on File Prior to Visit  Medication Sig Dispense Refill  . alprazolam (XANAX) 2 MG tablet Take 2 mg by mouth as needed.   5  . blood glucose meter kit and supplies KIT Dispense based on patient and insurance preference. Use up to four times daily as directed. (FOR ICD-9 250.00, 250.01). 1 each 0  . furosemide (LASIX) 40 MG tablet TAKE 1 TABLET(40 MG) BY MOUTH TWICE DAILY 60 tablet 3  . ivabradine (CORLANOR) 5 MG TABS tablet Take 5 mg by mouth 2 (two) times daily with a meal.    . metFORMIN (GLUCOPHAGE) 500 MG tablet TAKE 1 TABLET(500 MG) BY MOUTH TWICE DAILY WITH A MEAL FOR 30 DAYS 120 tablet 1  . methylphenidate (RITALIN) 20 MG tablet 2 (two) times a day.    . metoprolol succinate (TOPROL-XL) 25 MG 24 hr tablet TAKE 1 TABLET(25 MG) BY MOUTH DAILY 60 tablet 3  . sacubitril-valsartan (ENTRESTO) 49-51 MG Take 1 tablet by mouth 2 (two) times daily. 180 tablet 12  . spironolactone (ALDACTONE) 25 MG tablet TAKE 1 TABLET(25 MG) BY MOUTH DAILY 60 tablet 3   No current facility-administered medications on file prior to visit.    Cardiovascular studies:  EKG 01/07/2020: Sinus rhythm 70 bpm.  Normal EKG.  Echocardiogram 08/11/2018: Left ventricle cavity is normal in size. Mild concentric hypertrophy of the left ventricle. Mild decrease in global wall motion. Visual EF is 45-50%. Normal diastolic filling pattern.  Calculated EF 44%. Mild tricuspid regurgitation.  No evidence of pulmonary hypertension. Compared to previous hospital study on 04/02/2018, there is remarkable improvement in systolic, diastolic function, as well as pulmonary hypertension.   Recent labs: 03/24/2019: Glucose 103, BUN/Cr 17/0.97. EGFR 92. Na/K 140/4.5.  H/H 14/47. MCV 92. Platelets 205  2019: HbA1C 6.9% TSH 2.4 normal    Review of Systems  Cardiovascular: Negative for chest pain, dyspnea on exertion, leg swelling, palpitations and syncope.       Vitals:   01/07/20 1336  BP: 123/66  Pulse: 72  Temp: (!) 93.8 F (34.3 C)  SpO2: 96%     Body mass index is 30.92 kg/m. Filed Weights   01/07/20 1336  Weight: 228 lb (103.4 kg)    Objective:   Physical Exam  Constitutional: He appears well-developed and well-nourished.  Neck: No JVD present.  Cardiovascular: Normal rate, regular rhythm, normal heart sounds and intact distal pulses.  No murmur heard. Pulmonary/Chest: Effort normal and breath sounds normal. He has no wheezes. He has no rales.  Musculoskeletal:        General: No edema.  Nursing note and vitals reviewed.         Assessment & Recommendations:   49 year old Caucasian male with hypertension, type 2 DM, nonishcemic dilated cardiomyopathy, now with recovered LVEF to 45-50%.   Cardiomyopathy: NYHA class I-II. Continue Entresto to 49-51 mg twice daily, metoprolol succinate 25 mg  daily, spironolactone 25 mg daily, lasix  40 mg once daily Continue corlanor 5 mg twice daily.  Given his EF recovery and resolution of possible etiology (alcohol abuse), I offered weaning him off long term heart failure meds. He does not want to wean off a this time. His manufacturer driven patient assistance will conclude in August. I will see him in August to reinstate this, as necessary.  Will refill above meds and check labs.   Type 2 DM: Continue metformin Strongly emphasized need to establish care with  PCP  H/o polysubstance abuse: Congratulated on continued abstinence from alcohol, cocaine,  F/u in 6 months   Lyzette Reinhardt Esther Hardy, MD [facility] Pager: [phone] Office: [phone] If no answer Cell [phone]

## 2020-01-08 LAB — BASIC METABOLIC PANEL
BUN/Creatinine Ratio: 16 (ref 9–20)
BUN: 21 mg/dL (ref 6–24)
CO2: 25 mmol/L (ref 20–29)
Calcium: 9.7 mg/dL (ref 8.7–10.2)
Chloride: 101 mmol/L (ref 96–106)
Creatinine, Ser: 1.29 mg/dL — ABNORMAL HIGH (ref 0.76–1.27)
GFR calc Af Amer: 75 mL/min/{1.73_m2} (ref >59)
GFR calc non Af Amer: 65 mL/min/{1.73_m2} (ref >59)
Glucose: 107 mg/dL — ABNORMAL HIGH (ref 65–99)
Potassium: 4.8 mmol/L (ref 3.5–5.2)
Sodium: 142 mmol/L (ref 134–144)

## 2020-01-12 NOTE — Progress Notes (Signed)
Creatinine (Marker of kidney function) is slightly elevated. Recommend liberal hydration (with water). Please put a repeat BMP order for 1 month from now.  Thanks MJP

## 2020-01-13 DIAGNOSIS — I5022 Chronic systolic (congestive) heart failure: Secondary | ICD-10-CM

## 2020-02-25 IMAGING — DX DG FOOT 2V*L*
2 series · 2 of 2 positions shown · non-contrast
Comparison: None.

CLINICAL DATA: First metatarsophalangeal joint pain for 3 days.

EXAM:
LEFT FOOT - 2 VIEW

[foot ap]
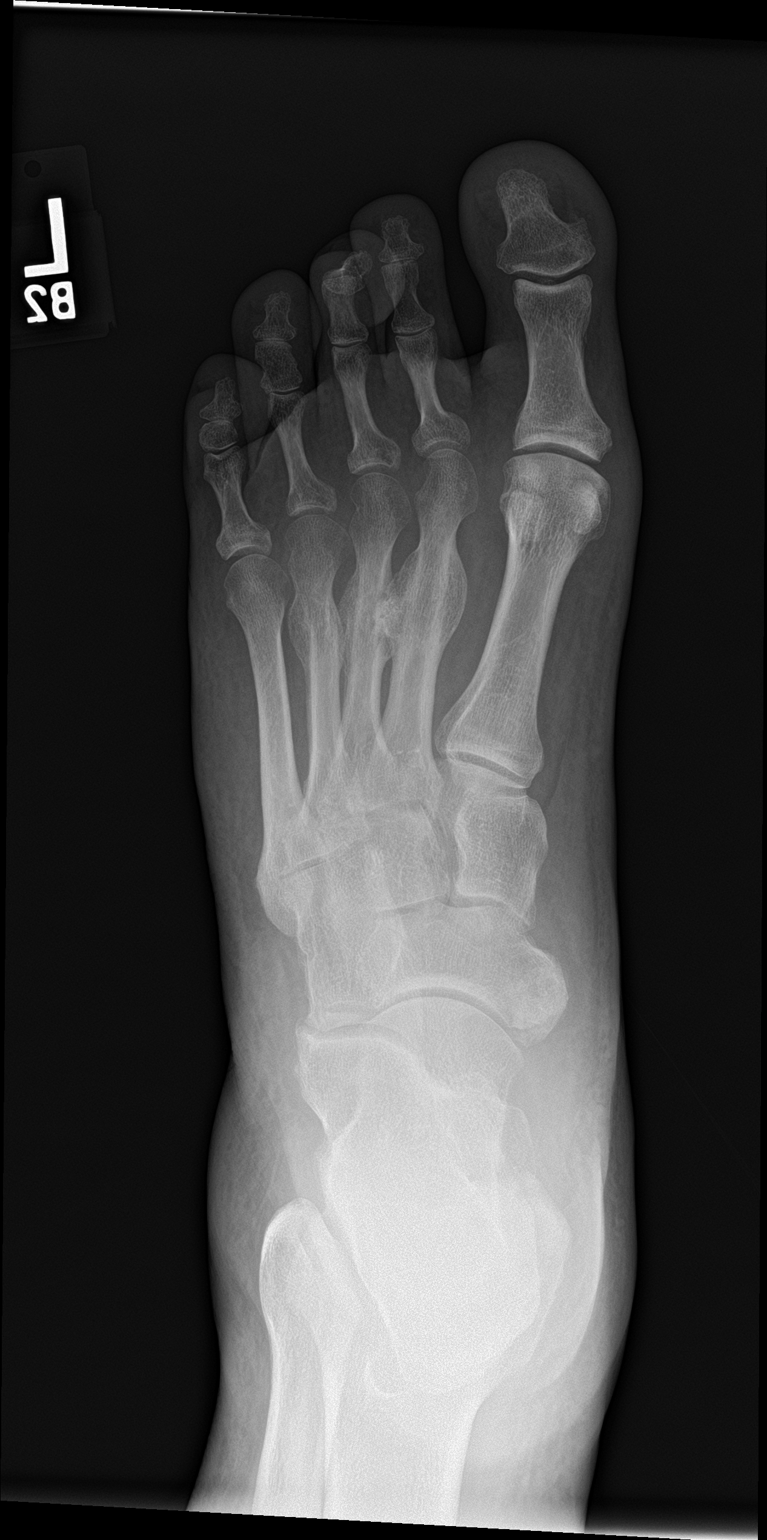

[foot lat]
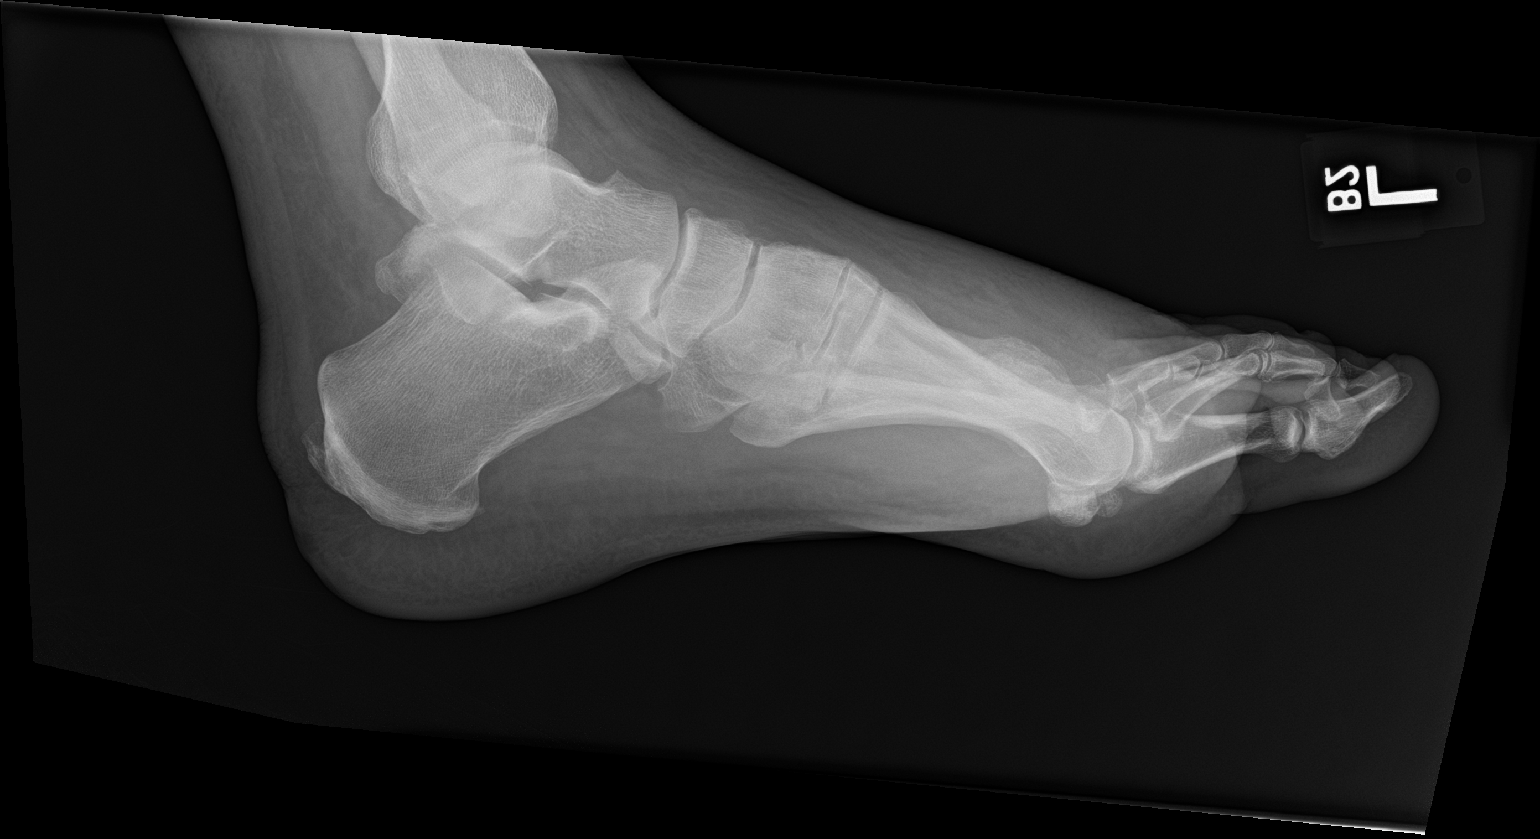

[2 of 2 positions shown; findings below may reference images not displayed]

FINDINGS: There is no evidence of acute fracture or dislocation. Probable
posttraumatic deformity with callus formation of the second, third
and fourth metatarsal bones. No evidence of osseous abnormalities at
the first metatarsophalangeal joint.

Diffuse dorsal soft tissue swelling of the mid and forefoot.
IMPRESSION: No radiographic evidence of gout.

Probable posttraumatic deformity with callus formation of the
second, third and fourth metatarsal bones. Alternatively this may
represent an exuberant periosteal reaction. Please correlate to
patient's prior history regarding prior trauma.

Diffuse dorsal soft tissue swelling of the mid and forefoot.

## 2020-02-26 ENCOUNTER — Other Ambulatory Visit: Payer: Self-pay | Admitting: Cardiology

## 2020-02-26 DIAGNOSIS — E119 Type 2 diabetes mellitus without complications: Secondary | ICD-10-CM

## 2020-04-28 ENCOUNTER — Other Ambulatory Visit: Payer: Self-pay | Admitting: Cardiology

## 2020-04-28 DIAGNOSIS — I5022 Chronic systolic (congestive) heart failure: Secondary | ICD-10-CM

## 2020-06-02 ENCOUNTER — Telehealth: Payer: Self-pay

## 2020-06-02 NOTE — Telephone Encounter (Signed)
Informed patient his [name] has been approved by [employer] patient through [id number].

## 2020-06-08 ENCOUNTER — Ambulatory Visit: Payer: Self-pay | Admitting: Cardiology

## 2020-06-28 ENCOUNTER — Other Ambulatory Visit: Payer: Self-pay | Admitting: Cardiology

## 2020-06-28 DIAGNOSIS — E119 Type 2 diabetes mellitus without complications: Secondary | ICD-10-CM

## 2020-08-16 ENCOUNTER — Other Ambulatory Visit: Payer: Self-pay

## 2020-08-28 ENCOUNTER — Telehealth: Payer: Self-pay

## 2020-08-28 DIAGNOSIS — I5022 Chronic systolic (congestive) heart failure: Secondary | ICD-10-CM

## 2020-08-28 NOTE — Telephone Encounter (Signed)
I faxed pt's [name] pt assistance to [employer]

## 2020-09-07 ENCOUNTER — Telehealth: Payer: Self-pay | Admitting: Cardiology

## 2020-09-07 ENCOUNTER — Other Ambulatory Visit: Payer: Self-pay | Admitting: Cardiology

## 2020-09-07 DIAGNOSIS — I5022 Chronic systolic (congestive) heart failure: Secondary | ICD-10-CM

## 2020-09-07 NOTE — Telephone Encounter (Signed)
Dear Dr. Evelene Croon,  I received your letter. Mr. Pullara has had known alcohol induced cardiomyopathy, which has improved with alcohol cessation and appropriate medical treatment.  In general, Ritalin is contraindicated in patients with cardiomyopathy due to risk of sudden death.  If there is no alternative to Ritalin, recommend risk-benefit discussion.  I recommend echocardiogram to evaluate his current ejection fraction.  Last echocardiogram was performed in October 2019.  I will see him after echocardiogram for my further assessment in the next few weeks.  Regards, Derris Millan   Elder Negus, MD Pager: [phone] Office: [phone]

## 2020-09-07 NOTE — Telephone Encounter (Signed)
Please also fax my above note addressed to Dr. Evelene Croon.

## 2020-09-07 NOTE — Telephone Encounter (Signed)
Note from March 2021 has been faxed to Dr. Wilburn Mylar

## 2020-09-07 NOTE — Telephone Encounter (Signed)
done

## 2020-11-08 ENCOUNTER — Ambulatory Visit: Payer: Self-pay

## 2020-11-08 ENCOUNTER — Other Ambulatory Visit: Payer: Self-pay

## 2020-11-08 DIAGNOSIS — I5022 Chronic systolic (congestive) heart failure: Secondary | ICD-10-CM

## 2020-11-08 MED ORDER — SPIRONOLACTONE 25 MG PO TABS: ORAL_TABLET | ORAL | 1 refills | Status: DC

## 2020-11-08 MED ORDER — METOPROLOL SUCCINATE ER 25 MG PO TB24: ORAL_TABLET | ORAL | 1 refills | Status: DC

## 2020-11-16 ENCOUNTER — Ambulatory Visit: Payer: Self-pay | Admitting: Cardiology

## 2020-11-29 ENCOUNTER — Other Ambulatory Visit: Payer: Self-pay

## 2020-11-29 ENCOUNTER — Encounter: Payer: Self-pay | Admitting: Cardiology

## 2020-11-29 ENCOUNTER — Ambulatory Visit: Payer: Self-pay | Admitting: Cardiology

## 2020-11-29 VITALS — BP 134/86 | HR 96 | Temp 97.4°F | Resp 16 | Ht 72.0 in | Wt 255.0 lb

## 2020-11-29 DIAGNOSIS — I10 Essential (primary) hypertension: Secondary | ICD-10-CM

## 2020-11-29 DIAGNOSIS — Z13220 Encounter for screening for lipoid disorders: Secondary | ICD-10-CM | POA: Insufficient documentation

## 2020-11-29 DIAGNOSIS — E119 Type 2 diabetes mellitus without complications: Secondary | ICD-10-CM

## 2020-11-29 DIAGNOSIS — I5022 Chronic systolic (congestive) heart failure: Secondary | ICD-10-CM

## 2020-11-29 NOTE — Progress Notes (Signed)
Follow up visit  Subjective:   [name], male    DOB: 1970-12-21, 50 y.o.   MRN: [id number]   Chief Complaint  Patient presents with  . Chronic systolic heart failure   . Follow-up    HPI  50 year old Caucasian male with hypertension, type 2 DM, nonishcemic dilated cardiomyopathy, now with recovered LVEF to 45-50%.  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 Patient has now been abstinent from alcohol, with near normal EF. He is compliant with his medical therapy, except Corlanor, which he ran out for for past few months. He still does not have any PCP, and does not have insurance. He denies chest pain, shortness of breath, palpitations, leg edema, orthopnea, PND, TIA/syncope. He has some pain in the dorsum of his feet, sometimes related to exertion.    Current Outpatient Medications on File Prior to Visit  Medication Sig Dispense Refill  . alprazolam (XANAX) 2 MG tablet Take 2 mg by mouth as needed.   5  . blood glucose meter kit and supplies KIT Dispense based on patient and insurance preference. Use up to four times daily as directed. (FOR ICD-9 250.00, 250.01). 1 each 0  . furosemide (LASIX) 40 MG tablet TAKE 1 TABLET(40 MG) BY MOUTH TWICE DAILY 60 tablet 6  . ivabradine (CORLANOR) 5 MG TABS tablet Take 1 tablet (5 mg total) by mouth 2 (two) times daily with a meal. 180 tablet 3  . metFORMIN (GLUCOPHAGE) 500 MG tablet TAKE 1 TABLET(500 MG) BY MOUTH TWICE DAILY WITH A MEAL FOR 30 DAYS 360 tablet 3  . methylphenidate (RITALIN) 20 MG tablet 2 (two) times a day.    . metoprolol succinate (TOPROL-XL) 25 MG 24 hr tablet TAKE 1 TABLET(25 MG) BY MOUTH DAILY 90 tablet 1  . sacubitril-valsartan (ENTRESTO) 49-51 MG Take 1 tablet by mouth 2 (two) times daily. 180 tablet 3  . spironolactone (ALDACTONE) 25 MG tablet TAKE 1 TABLET(25 MG) BY MOUTH DAILY 90 tablet 1   No current facility-administered medications on file prior to visit.    Cardiovascular  studies:  EKG 11/29/2020: Sinus rhythm 91 bpm Nonspecific T-abnormality  Echocardiogram 11/08/2020:  Normal LV systolic function with visual EF 55-60%. Left ventricle cavity  is normal in size. Normal global wall motion. Normal diastolic filling  pattern, normal LAP.  No significant valvular abnormalities.  Compared to prior study dated 08/11/2018: LVEF improved from 45-50% to  55-60% otherwise no significant changes.   Recent labs: 01/07/2020: Glucose 107, BUN/Cr 21/1.29. EGFR 65. Na/K 142/4.8.   03/24/2019: Glucose 103, BUN/Cr 17/0.97. EGFR 92. Na/K 140/4.5.  H/H 14/47. MCV 92. Platelets 205  2019: HbA1C 6.9% TSH 2.4 normal    Review of Systems  Cardiovascular: Negative for chest pain, dyspnea on exertion, leg swelling, palpitations and syncope.       Vitals:   11/29/20 1503  BP: 134/86  Pulse: 96  Resp: 16  Temp: (!) 97.4 F (36.3 C)  SpO2: 96%     Body mass index is 34.58 kg/m. Filed Weights   11/29/20 1503  Weight: 255 lb (115.7 kg)    Objective:   Physical Exam Vitals and nursing note reviewed.  Constitutional:      Appearance: He is well-developed.  Neck:     Vascular: No JVD.  Cardiovascular:     Rate and Rhythm: Normal rate and regular rhythm.     Pulses: Intact distal pulses.     Heart sounds: Normal heart sounds. No murmur heard.  Pulmonary:     Effort: Pulmonary effort is normal.     Breath sounds: Normal breath sounds. No wheezing or rales.           Assessment & Recommendations:   50 year old Caucasian male with hypertension, type 2 DM, nonishcemic dilated cardiomyopathy, now with recovered LVEF to 45-50%.   Cardiomyopathy: EF normalized (echocardiogram 11/2020) Continue Entresto to 49-51 mg twice daily, metoprolol succinate 25 mg daily, spironolactone 25 mg daily, lasix  40 mg once daily Will refill above meds and check labs.   Type 2 DM: Continue metformin Re-iterated the need to establish care with PCP Pain in feet  could be diabetic neuropath. Defer to PCP.  H/o polysubstance abuse: Re-emphasized the importance of alcohol abstinence. He currently drinks 1-2 drinks/week.  F/u in 1 year  [name], MD [facility] Pager: [phone] Office: [phone] If no answer Cell [phone]

## 2021-03-23 ENCOUNTER — Other Ambulatory Visit: Payer: Self-pay

## 2021-03-23 DIAGNOSIS — I5022 Chronic systolic (congestive) heart failure: Secondary | ICD-10-CM

## 2021-03-23 MED ORDER — ENTRESTO 49-51 MG PO TABS: 1.0000 | ORAL_TABLET | Freq: Two times a day (BID) | ORAL | 3 refills | Status: DC

## 2021-04-24 ENCOUNTER — Other Ambulatory Visit: Payer: Self-pay

## 2021-04-24 DIAGNOSIS — I5022 Chronic systolic (congestive) heart failure: Secondary | ICD-10-CM

## 2021-04-24 MED ORDER — ENTRESTO 49-51 MG PO TABS: 1.0000 | ORAL_TABLET | Freq: Two times a day (BID) | ORAL | 3 refills | Status: DC

## 2021-05-28 ENCOUNTER — Other Ambulatory Visit: Payer: Self-pay | Admitting: Cardiology

## 2021-05-28 DIAGNOSIS — I5022 Chronic systolic (congestive) heart failure: Secondary | ICD-10-CM

## 2021-05-29 ENCOUNTER — Other Ambulatory Visit: Payer: Self-pay | Admitting: Cardiology

## 2021-05-29 DIAGNOSIS — I5022 Chronic systolic (congestive) heart failure: Secondary | ICD-10-CM

## 2021-08-15 ENCOUNTER — Other Ambulatory Visit: Payer: Self-pay

## 2021-08-15 DIAGNOSIS — I5022 Chronic systolic (congestive) heart failure: Secondary | ICD-10-CM

## 2021-08-15 MED ORDER — ENTRESTO 49-51 MG PO TABS
1.0000 | ORAL_TABLET | Freq: Two times a day (BID) | ORAL | 3 refills | Status: AC
Start: 2021-08-15 — End: ?

## 2021-11-07 ENCOUNTER — Other Ambulatory Visit: Payer: Self-pay

## 2021-11-07 DIAGNOSIS — I5022 Chronic systolic (congestive) heart failure: Secondary | ICD-10-CM

## 2021-11-07 MED ORDER — METOPROLOL SUCCINATE ER 25 MG PO TB24
25.0000 mg | ORAL_TABLET | Freq: Every day | ORAL | 1 refills | Status: AC
Start: 2021-11-07 — End: ?

## 2021-11-07 MED ORDER — SPIRONOLACTONE 25 MG PO TABS: 25.0000 mg | ORAL_TABLET | Freq: Every day | ORAL | 1 refills | Status: AC

## 2021-11-29 ENCOUNTER — Ambulatory Visit: Payer: Self-pay | Admitting: Cardiology

## 2021-12-26 ENCOUNTER — Ambulatory Visit: Payer: Self-pay | Admitting: Cardiology

## 2022-01-01 ENCOUNTER — Other Ambulatory Visit: Payer: Self-pay

## 2022-01-01 DIAGNOSIS — E119 Type 2 diabetes mellitus without complications: Secondary | ICD-10-CM

## 2022-01-01 MED ORDER — METFORMIN HCL 500 MG PO TABS: ORAL_TABLET | ORAL | 3 refills | Status: AC

## 2022-05-16 ENCOUNTER — Encounter (HOSPITAL_COMMUNITY): Payer: Self-pay | Admitting: Hospitalist

## 2022-05-16 ENCOUNTER — Inpatient Hospital Stay
Admission: RE | Admit: 2022-05-16 | Discharge: 2022-06-13 | DRG: 897 | Disposition: A | Discharge status: Home / Self Care | Payer: MEDICAID | Source: Other Acute Inpatient Hospital | Attending: Hospitalist | Admitting: Hospitalist

## 2022-05-16 ENCOUNTER — Inpatient Hospital Stay (HOSPITAL_COMMUNITY): Payer: MEDICAID | Admitting: Hospitalist

## 2022-05-16 ENCOUNTER — Other Ambulatory Visit: Payer: Self-pay

## 2022-05-16 DIAGNOSIS — F32A Depression, unspecified: Secondary | ICD-10-CM

## 2022-05-16 DIAGNOSIS — F12280 Cannabis dependence with cannabis-induced anxiety disorder: Secondary | ICD-10-CM | POA: Diagnosis present

## 2022-05-16 DIAGNOSIS — F515 Nightmare disorder: Secondary | ICD-10-CM | POA: Diagnosis present

## 2022-05-16 DIAGNOSIS — F1020 Alcohol dependence, uncomplicated: Secondary | ICD-10-CM | POA: Diagnosis present

## 2022-05-16 DIAGNOSIS — F411 Generalized anxiety disorder: Secondary | ICD-10-CM | POA: Diagnosis present

## 2022-05-16 DIAGNOSIS — K219 Gastro-esophageal reflux disease without esophagitis: Secondary | ICD-10-CM | POA: Diagnosis present

## 2022-05-16 DIAGNOSIS — F1320 Sedative, hypnotic or anxiolytic dependence, uncomplicated: Secondary | ICD-10-CM

## 2022-05-16 DIAGNOSIS — M2550 Pain in unspecified joint: Secondary | ICD-10-CM | POA: Diagnosis present

## 2022-05-16 DIAGNOSIS — M25552 Pain in left hip: Principal | ICD-10-CM

## 2022-05-16 DIAGNOSIS — G8929 Other chronic pain: Secondary | ICD-10-CM | POA: Diagnosis present

## 2022-05-16 DIAGNOSIS — F13280 Sedative, hypnotic or anxiolytic dependence with sedative, hypnotic or anxiolytic-induced anxiety disorder: Secondary | ICD-10-CM | POA: Diagnosis present

## 2022-05-16 DIAGNOSIS — Z560 Unemployment, unspecified: Secondary | ICD-10-CM

## 2022-05-16 DIAGNOSIS — I509 Heart failure, unspecified: Secondary | ICD-10-CM | POA: Diagnosis present

## 2022-05-16 DIAGNOSIS — R519 Headache, unspecified: Secondary | ICD-10-CM | POA: Diagnosis present

## 2022-05-16 DIAGNOSIS — K5900 Constipation, unspecified: Secondary | ICD-10-CM | POA: Diagnosis present

## 2022-05-16 DIAGNOSIS — E119 Type 2 diabetes mellitus without complications: Secondary | ICD-10-CM | POA: Diagnosis present

## 2022-05-16 DIAGNOSIS — Z720 Tobacco use: Secondary | ICD-10-CM

## 2022-05-16 DIAGNOSIS — F15280 Other stimulant dependence with stimulant-induced anxiety disorder: Secondary | ICD-10-CM | POA: Diagnosis present

## 2022-05-16 DIAGNOSIS — F1620 Hallucinogen dependence, uncomplicated: Secondary | ICD-10-CM

## 2022-05-16 DIAGNOSIS — F1220 Cannabis dependence, uncomplicated: Secondary | ICD-10-CM

## 2022-05-16 DIAGNOSIS — Z20822 Contact with and (suspected) exposure to covid-19: Secondary | ICD-10-CM | POA: Diagnosis present

## 2022-05-16 DIAGNOSIS — F17210 Nicotine dependence, cigarettes, uncomplicated: Secondary | ICD-10-CM | POA: Diagnosis present

## 2022-05-16 DIAGNOSIS — Z814 Family history of other substance abuse and dependence: Secondary | ICD-10-CM

## 2022-05-16 DIAGNOSIS — G4700 Insomnia, unspecified: Secondary | ICD-10-CM | POA: Diagnosis present

## 2022-05-16 DIAGNOSIS — F329 Major depressive disorder, single episode, unspecified: Secondary | ICD-10-CM | POA: Diagnosis present

## 2022-05-16 DIAGNOSIS — Z79899 Other long term (current) drug therapy: Secondary | ICD-10-CM

## 2022-05-16 DIAGNOSIS — Z7984 Long term (current) use of oral hypoglycemic drugs: Secondary | ICD-10-CM

## 2022-05-16 DIAGNOSIS — F1520 Other stimulant dependence, uncomplicated: Secondary | ICD-10-CM

## 2022-05-16 DIAGNOSIS — F16180 Hallucinogen abuse with hallucinogen-induced anxiety disorder: Secondary | ICD-10-CM | POA: Diagnosis present

## 2022-05-16 DIAGNOSIS — F410 Panic disorder [episodic paroxysmal anxiety] without agoraphobia: Secondary | ICD-10-CM | POA: Diagnosis present

## 2022-05-16 DIAGNOSIS — F10280 Alcohol dependence with alcohol-induced anxiety disorder: Principal | ICD-10-CM | POA: Diagnosis present

## 2022-05-16 DIAGNOSIS — I110 Hypertensive heart disease with heart failure: Secondary | ICD-10-CM | POA: Diagnosis present

## 2022-05-16 DIAGNOSIS — Z658 Other specified problems related to psychosocial circumstances: Secondary | ICD-10-CM

## 2022-05-16 DIAGNOSIS — F4010 Social phobia, unspecified: Secondary | ICD-10-CM | POA: Diagnosis present

## 2022-05-16 DIAGNOSIS — Z820 Family history of epilepsy and other diseases of the nervous system: Secondary | ICD-10-CM

## 2022-05-16 DIAGNOSIS — F419 Anxiety disorder, unspecified: Secondary | ICD-10-CM

## 2022-05-16 HISTORY — DX: Type 2 diabetes mellitus without complications: E11.9

## 2022-05-16 HISTORY — DX: Alcohol dependence, uncomplicated: F10.20

## 2022-05-16 HISTORY — DX: Other stimulant dependence, uncomplicated: F15.20

## 2022-05-16 HISTORY — DX: Gastro-esophageal reflux disease without esophagitis: K21.9

## 2022-05-16 HISTORY — DX: Depression, unspecified: F32.A

## 2022-05-16 HISTORY — DX: Anxiety disorder, unspecified: F41.9

## 2022-05-16 HISTORY — DX: Sedative, hypnotic or anxiolytic dependence, uncomplicated: F13.20

## 2022-05-16 LAB — RAPID COVID-19 SCREENING, EXTERNAL (AMB): COVID-19 AG: NEGATIVE

## 2022-05-16 MED ORDER — FUROSEMIDE 20 MG TABLET
40.0000 mg | ORAL_TABLET | Freq: Every day | ORAL | Status: DC
Start: 2022-05-17 — End: 2022-06-13
  Administered 2022-05-17 – 2022-06-13 (×28): 40 mg via ORAL

## 2022-05-16 MED ORDER — ACETAMINOPHEN 325 MG TABLET
975.0000 mg | ORAL_TABLET | Freq: Three times a day (TID) | ORAL | Status: DC | PRN
Start: 2022-05-16 — End: 2022-05-17
  Administered 2022-05-17: 975 mg via ORAL

## 2022-05-16 MED ORDER — METFORMIN 500 MG TABLET
500.0000 mg | ORAL_TABLET | Freq: Every morning | ORAL | Status: DC
Start: 2022-05-17 — End: 2022-06-13
  Administered 2022-05-17 – 2022-06-13 (×28): 500 mg via ORAL

## 2022-05-16 MED ORDER — ACETAMINOPHEN 325 MG TABLET
325.0000 mg | ORAL_TABLET | Freq: Three times a day (TID) | ORAL | Status: DC | PRN
Start: 2022-05-16 — End: 2022-05-17
  Administered 2022-05-16 – 2022-05-17 (×2): 325 mg via ORAL

## 2022-05-16 MED ORDER — HYDROXYZINE PAMOATE 25 MG CAPSULE
25.0000 mg | ORAL_CAPSULE | Freq: Every day | ORAL | Status: AC | PRN
Start: 2022-05-30 — End: 2022-06-06

## 2022-05-16 MED ORDER — METOPROLOL SUCCINATE ER 25 MG TABLET,EXTENDED RELEASE 24 HR
25.0000 mg | ORAL_TABLET | Freq: Every day | ORAL | Status: DC
Start: 2022-05-17 — End: 2022-06-13
  Administered 2022-05-17 – 2022-06-13 (×28): 25 mg via ORAL

## 2022-05-16 MED ORDER — SACUBITRIL 24 MG-VALSARTAN 26 MG TABLET
1.0000 | ORAL_TABLET | Freq: Two times a day (BID) | ORAL | Status: DC
Start: 2022-05-17 — End: 2022-05-17

## 2022-05-16 MED ORDER — MELATONIN 3 MG TABLET
6.0000 mg | ORAL_TABLET | Freq: Every evening | ORAL | Status: DC | PRN
Start: 2022-05-16 — End: 2022-06-13
  Administered 2022-05-18 – 2022-06-12 (×25): 6 mg via ORAL

## 2022-05-16 MED ORDER — NICOTINE (POLACRILEX) 4 MG BUCCAL LOZENGE
4.0000 mg | LOZENGE | BUCCAL | Status: DC | PRN
Start: 2022-05-16 — End: 2022-06-13

## 2022-05-16 MED ORDER — LOPERAMIDE 2 MG CAPSULE
2.0000 mg | ORAL_CAPSULE | ORAL | Status: DC | PRN
Start: 2022-05-16 — End: 2022-06-13
  Administered 2022-05-24: 2 mg via ORAL

## 2022-05-16 MED ORDER — INSULIN LISPRO 100 UNIT/ML SUB-Q SSIP
0.0000 [IU] | INJECTION | Freq: Four times a day (QID) | SUBCUTANEOUS | Status: DC | PRN
Start: 2022-05-16 — End: 2022-05-17

## 2022-05-16 MED ORDER — PANTOPRAZOLE 20 MG TABLET,DELAYED RELEASE
40.0000 mg | DELAYED_RELEASE_TABLET | Freq: Every day | ORAL | Status: DC
Start: 2022-05-17 — End: 2022-06-13
  Administered 2022-05-17 – 2022-06-13 (×28): 40 mg via ORAL

## 2022-05-16 MED ORDER — FOLIC ACID 1 MG TABLET
1.0000 mg | ORAL_TABLET | Freq: Every day | ORAL | Status: DC
Start: 2022-05-17 — End: 2022-06-13
  Administered 2022-05-17 – 2022-06-13 (×28): 1 mg via ORAL

## 2022-05-16 MED ORDER — HYDROXYZINE PAMOATE 25 MG CAPSULE
25.0000 mg | ORAL_CAPSULE | Freq: Two times a day (BID) | ORAL | Status: AC | PRN
Start: 2022-05-23 — End: 2022-05-30

## 2022-05-16 MED ORDER — CLONIDINE HCL 0.1 MG TABLET
0.1000 mg | ORAL_TABLET | Freq: Three times a day (TID) | ORAL | Status: DC | PRN
Start: 2022-05-16 — End: 2022-06-13

## 2022-05-16 MED ORDER — POLYETHYLENE GLYCOL 3350 17 GRAM ORAL POWDER PACKET
17.0000 g | Freq: Every day | ORAL | Status: DC | PRN
Start: 2022-05-16 — End: 2022-06-13

## 2022-05-16 MED ORDER — NICOTINE 21 MG/24 HR DAILY TRANSDERMAL PATCH
21.0000 mg | MEDICATED_PATCH | Freq: Every day | TRANSDERMAL | Status: DC | PRN
Start: 2022-05-16 — End: 2022-06-13

## 2022-05-16 MED ORDER — NICOTINE (POLACRILEX) 4 MG GUM
4.0000 mg | CHEWING_GUM | BUCCAL | Status: DC | PRN
Start: 2022-05-16 — End: 2022-06-13

## 2022-05-16 MED ORDER — SPIRONOLACTONE 25 MG TABLET
25.0000 mg | ORAL_TABLET | Freq: Every day | ORAL | Status: DC
Start: 2022-05-17 — End: 2022-06-13
  Administered 2022-05-17 – 2022-06-13 (×28): 25 mg via ORAL

## 2022-05-16 MED ORDER — FAMOTIDINE 20 MG TABLET
20.0000 mg | ORAL_TABLET | Freq: Two times a day (BID) | ORAL | Status: DC
Start: 2022-05-16 — End: 2022-05-22
  Administered 2022-05-16 – 2022-05-18 (×5): 20 mg via ORAL
  Administered 2022-05-19: 0 mg via ORAL
  Administered 2022-05-19: 20 mg via ORAL
  Administered 2022-05-20 (×2): 0 mg via ORAL
  Administered 2022-05-21 (×2): 20 mg via ORAL
  Administered 2022-05-22: 0 mg via ORAL

## 2022-05-16 MED ORDER — CEPHALEXIN 500 MG CAPSULE
500.0000 mg | ORAL_CAPSULE | Freq: Four times a day (QID) | ORAL | Status: AC
Start: 2022-05-16 — End: 2022-05-22
  Administered 2022-05-16 – 2022-05-21 (×19): 500 mg via ORAL
  Administered 2022-05-21: 0 mg via ORAL
  Administered 2022-05-21: 500 mg via ORAL
  Administered 2022-05-21 – 2022-05-22 (×3): 0 mg via ORAL

## 2022-05-16 MED ORDER — HYDROXYZINE PAMOATE 25 MG CAPSULE
25.0000 mg | ORAL_CAPSULE | Freq: Three times a day (TID) | ORAL | Status: AC | PRN
Start: 2022-05-16 — End: 2022-05-23
  Administered 2022-05-17: 25 mg via ORAL
  Filled 2022-05-16: qty 1

## 2022-05-16 MED ORDER — SENNOSIDES 8.6 MG-DOCUSATE SODIUM 50 MG TABLET
1.0000 | ORAL_TABLET | Freq: Every evening | ORAL | Status: DC | PRN
Start: 2022-05-16 — End: 2022-06-13

## 2022-05-16 MED ORDER — ONDANSETRON 4 MG DISINTEGRATING TABLET
4.0000 mg | ORAL_TABLET | Freq: Three times a day (TID) | ORAL | Status: DC | PRN
Start: 2022-05-16 — End: 2022-06-13

## 2022-05-16 MED ORDER — LORAZEPAM 2 MG/ML INJECTION WRAPPER
2.0000 mg | Freq: Once | INTRAMUSCULAR | Status: DC | PRN
Start: 2022-05-16 — End: 2022-06-13

## 2022-05-16 NOTE — H&P (Signed)
Center for [facility] and [facility]  Residential Treatment Unit (3.5 Level of Care)  Admission History and Physical Note    [name]  MRN: [id number]  DOB: 03-07-1971  Date of Service: 05/16/22  Referral Source: [facility]      Chief Complaint: Xanax, alcohol, Ritalin, psilocybin, marijuana addiction.    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 Appropriate orders and protocol for the unit will be placed/followed. Any further lab studies needed will be obtained and all appropriate medications will be continued at this time. Any additional considerations will be listed below.   -No labs obtained prior to admission. Will order CBC/Diff, BMP, Mg, Phos, Hepatic Function Panel, HIV screen, Hepatitis serologies, instant UDS, and serum Ethanol.  -Continue folic acid and multivitamin supplements daily.  -Discussed starting Naltrexone, Antabuse and/or Campral with patient for treatment of AUD. Patient agrees with this/declines. Will continue to discuss during this admission.  -NRT to be used as needed. Nicotine TD 21mg  daily prn, nicotine lozenges and gum prn ordered.  -Miralax 17g daily prn for constipation.  -Zofran 4mg   TID prn for nausea/vomiting.  -Tylenol 325-975 mg TID prn for pain.  -Melatonin 6mg  nightly prn for insomnia.  -Imodium 2mg  q4h prn for diarrhea.  -3-week Vistaril 25 mg TID prn taper ordered (decreases by 25 mg every week).  -Instant UDS ordered with plans to send for confirmatory testing if needed.  -MNT protocol ordered for dietician.  -Weight and Height on admission.  -Vitals daily and prn.  -Patient is to participate in group therapy and daily activities as able.        History of Present Illness:   Leonard [name] is a 51 y.o. 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 and [facility] for assistance in treating their substance use disorder.       DEPRESSION: Yes Poor Sleep, Anhedonia, Guilt, Poor Energy, Poor Concentration, Decreased Appetite, previous but not current SI. No HI with/without plan.    MANIA: Pt denies periods of time with decreased need for sleep, characterized by pressured speech, elevated mood, impulsivity, grandiosity, racing thoughts, and increased goal-directed activity. "Only when drunk"    ANXIETY: Yes social anxiety. No agoraphobia. Few panic attacks (characterized by SOB, CP, numbness/tingling, palpitations, diaphoresis, sense of impending doom) triggered during times he is anxious, but no specific triggers. No obsessive thoughts accompanied by compulsive behavior.    PSYCHOSIS: No A/VH, delusions, paranoia. No tactile hallucinations.    TRAUMA: No History of  abuse (sexual, physical, emotional). No flashbacks, Yes nightmares.    SUBSTANCE ABUSE  Alcohol: First use age 51, pint to a fifth daily, last use last Wednesday 05/08/2022.  Benzo's: First illicit use age 51. Prescribed xanax, last use last Wednesday 05/08/2022.  Tobacco: First use age 51. Half pack per day. Actively using.  THC: First use age 51, 2-3 times weekly, 1 joint, last use last Wednesday 05/08/2022.  Opiates: Pain Pills: First use age 51, not regular use, 2-3 pills per use, snort and oral, last use a few months ago. Heroin: Used once age 51. Illicit Buprenorphine/Methadone: Denies  Stimulants: Cocaine: First use age 51, every other day, 1g, snort and smoke, last use a couple of months age. Meth: Used a few times 5 years ago, a line or two, snorted. Illicit Rx Amphetamines: Denies   Other: LSD: First use age 51, every so often, couple hits PCP: First use age 51, every so often, couple hits  Psilocybin: First use age 51, last use couple weeks ago, microdose, every other day. Kratom: Denies  Longest sober period? 5 years, while in prison  AA/NA meetings? Yes  Ever prescribed Antabuse/Campral/Naltrexone/Buprenorphine? Denies, "Prescribed Ritalin and Xanax:"    Past Psychiatric History:    Current Outpatient: No   Past Outpatient: No  Inpatient: [facility], age 51  Medication Trials: Denies  Suicide Attempts: No    Social History:  Residence: Patient lives in [place] with brother in a house.   Education: Associates degree  Learning Disabilities: ADHD  Employment: Unemployed, Managed night clubs previously   Hotel manager: No  Support: 2 brothers, Father, aunt, uncle  Children: 1 Son  Legal: No    Family History:   Mental health issues: Mother - Alzheimer's, Grandmother - Dementia  Suicide/Suicide Attempts: No  Substance abuse: Father, Son, Emelia Loron     Past Medical History:   Diagnosis Date   . Anxiety    . Depression    . Diabetes mellitus type II, non insulin dependent (CMS HCC)    . GERD  (gastroesophageal reflux disease)    . Ritalin use disorder, severe (CMS HCC)    . Severe alcohol use disorder (CMS HCC)    . 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 No pertinent surgical history.  Past Surgical History Pertinent Negatives:   Procedure Date Noted   . HX HEART SURGERY 05/16/2022   . THYROID SURGERY 05/16/2022       Outpatient Medications Marked as Taking for the 05/16/22 encounter [facility] Encounter)   Medication Sig   . acetaminophen (TYLENOL) 325 mg Oral Tablet Take 2 Tablets (650 mg total) by mouth Every 6 hours as needed for Pain   . cephalexin (KEFLEX) 500 mg Oral Capsule Take 1 Capsule (500 mg total) by mouth Four times a day   . famotidine (PEPCID) 20 mg Oral Tablet Take 1 Tablet (20 mg total) by mouth Twice daily   . folic acid (FOLVITE) 1 mg Oral Tablet Take 1 Tablet (1 mg total) by mouth Once a day   . Ibuprofen (MOTRIN) 200 mg Oral Tablet Take 3 Tablets (600 mg total) by mouth Four times a day as needed for Pain   . metFORMIN (GLUCOPHAGE) 500 mg Oral Tablet Take 1 Tablet (500 mg total) by mouth Every morning with breakfast   . metoprolol succinate (TOPROL-XL) 25 mg Oral Tablet Sustained Release 24  hr Take 1 Tablet (25 mg total) by mouth Once a day   . pantoprazole (PROTONIX) 40 mg Oral Tablet, Delayed Release (E.C.) Take 1 Tablet (40 mg total) by mouth Once a day   . sacubitriL-valsartan (ENTRESTO) 24-26 mg Oral Tablet Take 1 Tablet by mouth Twice daily   . sennosides-docusate sodium (SENOKOT-S) 8.6-50 mg Oral Tablet Take 1 Tablet by mouth Every night as needed (Constipation)     forosemide, spirolactone since 3-4 years ago, 25mg  once daily    Review of Systems:    General:  Positive for body aches. No fever or chills   Vision:  No vision changes   HENT:  Positive for headache. No sinus congestion or sore throat    Cardiac:  No chest pain or palpitations   Respiratory:  No  shortness of breath or cough   Abdomen:  No nausea, vomiting, diarrhea, constipation, abdominal pain, melena, or hematochezia   GU:  No dysuria or hematuria   Skin:  No rashes or bruises   Musculoskeletal:  No muscle or joint pain   Endocrine:  No diabetes or thyroid problems   Neuro:  No weakness or numbness   All other review of systems negative      Physical Examination:   Most Recent Vitals    Flowsheet Row Admission (Current) from 05/16/2022 in Residential, Center   for [facility] and [facility]   Temperature 36.1 C (97 F) filed at... 05/16/2022 1514   Heart Rate 108 filed at... 05/16/2022 1514   Respiratory Rate 16 filed at... 05/16/2022 1514   BP (Non-Invasive) 134/78 filed at... 05/16/2022 1514   SpO2 98 % filed at... 05/16/2022 1514   Height 1.872 m (6' 1.7") filed at... 05/16/2022 1514   Weight 105 kg (230 lb 9.6 oz) filed at... 05/16/2022 1514   BMI (Calculated) 29.91 filed at... 05/16/2022 1514   BSA (Calculated) 2.33 filed at... 05/16/2022 1514      General: no distress  Eyes: Conjunctiva clear, no nystagmus, no apparent EOM abnormalities  HENT: Head normocephalic, atraumatic  Neck: Supple  Heart: RRR, no appreciable murmurs  Lungs: No wheezes or labored breathing  Abdomen: Nondistended; non-tender no pain during position changes  Extremities: No clubbing, cyanosis, or edema on exposed skin  Neurologic: No gait abnormalities, gross motor or sensory deficits, or tremulousness    Mental Status Exam:   Level of Consciousness:  alert   Orientation:  grossly normal   Appearance:  casually dressed, appears actual age and no apparent distress   Behavior:  cooperative   Motor/MS:  abnormal gait (limp)   Speech:  normal   Memory:  grossly normal   Mood:  euthymic   Affect:  consistent with mood   Perception:  normal   Thought Process:  goal directed/coherent   Thought Content:  appropriate    Suicidal Ideation:  none.     Homicidal Ideation:  none   Insight: poot   Judgement:   unimpaired   Conceptual:  concrete thinking   Fund of Knowledge:  Appropriate for education level      Labs:  -No labs obtained prior to admission. Will order CBC/Diff, BMP, Mg, Phos, Hepatic Function Panel, HIV screen, Hepatitis serologies, instant UDS, and serum Ethanol      Christina White, STUDENT PHYSICIAN ASSISTANT    I was present when the student was taking history, performing the exam, and during any medical decision making activities. I personally verified the history, performed the exam, and medical decision making as edited  in the student's note. I agree with the physician assistant student's note.     Arlean Hopping DO, MSc     05/16/2022     16:30  Assistant Professor- Addiction Medicine  Department of Behavioral Medicine and Psychiatry  Pager [phone]

## 2022-05-16 NOTE — Care Plan (Signed)
Problem: Adult Behavioral Health Plan of Care  Goal: Patient-Specific Goal (Individualization)  Outcome: Ongoing (see interventions/notes)  Flowsheets (Taken 05/16/2022 1628)  Individualized Care Needs: substance abuse  Anxieties, Fears or Concerns: being homelessand stability  Patient-Specific Goals (Include Timeframe): "To Stop drinking I like it"     Problem: Adult Behavioral Health Plan of Care  Goal: Develops/Participates in Therapeutic Alliance to Support Successful Transition  Intervention: [facility] Therapeutic Alliance  Recent Flowsheet Documentation  Taken 05/16/2022 1628 by Salomon Fick, RN  Trust Relationship/Rapport:   care explained   reassurance provided   choices provided   thoughts/feelings acknowledged   emotional support provided   empathic listening provided   questions answered   questions encouraged                Client is alert and oriented x 3. Speech is clear, normal tone, and non pressured. Thoughts are linear, and non delusional. Affect is animated and congruent with mood. Client endorsed as mood being anxious and why he drinks. He drinks daily anywhere from at least a pint to 1/5 ltr of liquor daily. Last drink was 8 days ago on Wednesday. He uses cocaine about 3 times per week. States has not used for couple of months. Safety search and skin assessment performed. No wounds noted or skin issues. Urine drug screen obtained. Has attended groups and was engaged. Interacted well with other peers on the unit and was appropriate. Patient denies SI-HI-AVH. Appetite is good, vss, and there is no s/s of acute distress noted upon assessment will continue to monitor status.   Salomon Fick, RN

## 2022-05-16 NOTE — Behavioral Health (Signed)
[facility] Medicine  Center for [facility]  Residential Shift Note 3-11     Shift synopsis: Leonard [name] has been pleasant and appropriate this shift. [name] has been spending time socializing with other guest in the community rooms. [name] did not attend any groups.       Mood: [name] was pleasant and appropriate      Affect: : Affect appeared to be consistent with mood     Phone calls: No phone call, placed on hold.       Appetite: : Appetite was good       Groups: [name] did not attend any groups this shift.    Leonard [name], MHS

## 2022-05-16 NOTE — Group Note (Signed)
Group topic:  ACTIVITY GROUP    Date of group:  05/16/2022  Start time of group:  1830  End time of group:  1930                 Summary of group discussion: NA group Meeting, Topic: Humility       Leonard [name]  is a 51 y.o. male participating in a activity group.    Patient observations: [name] did not attend group, [name] was given a bed pass       Patient goals:      Odelia Gage, MHS  05/16/2022, 20:02

## 2022-05-16 NOTE — Nurses Notes (Signed)
Client laying down in room. When this writer went to check on client he stated he had a bad h/a. Bed pass given for N/A for this evening. Gave 975 mg Tylenol. Client laying down on his bed. Explained to client to come to Nursing station if his h/a worsened or did not get better.

## 2022-05-17 ENCOUNTER — Other Ambulatory Visit: Payer: Self-pay

## 2022-05-17 ENCOUNTER — Encounter (HOSPITAL_COMMUNITY): Payer: Self-pay

## 2022-05-17 MED ORDER — ACETAMINOPHEN 325 MG TABLET
975.0000 mg | ORAL_TABLET | Freq: Three times a day (TID) | ORAL | Status: DC | PRN
Start: 2022-05-17 — End: 2022-06-13
  Administered 2022-05-18 – 2022-06-12 (×47): 975 mg via ORAL

## 2022-05-17 MED ORDER — METFORMIN 500 MG TABLET
500.0000 mg | ORAL_TABLET | Freq: Every morning | ORAL | 0 refills | Status: DC
Start: 2022-05-17 — End: 2022-06-12
  Filled 2022-05-17: qty 30, 30d supply, fill #0

## 2022-05-17 MED ORDER — ENTRESTO 49 MG-51 MG TABLET
1.0000 | ORAL_TABLET | Freq: Two times a day (BID) | ORAL | 0 refills | Status: DC
Start: 2022-05-17 — End: 2022-05-22
  Filled 2022-05-17 – 2022-05-21 (×2): qty 60, 30d supply, fill #0

## 2022-05-17 MED ORDER — IBUPROFEN 200 MG TABLET
400.0000 mg | ORAL_TABLET | Freq: Four times a day (QID) | ORAL | Status: DC | PRN
Start: 2022-05-17 — End: 2022-06-13
  Administered 2022-05-18 – 2022-06-04 (×10): 400 mg via ORAL
  Administered 2022-06-07: 0 mg via ORAL

## 2022-05-17 MED ORDER — PANTOPRAZOLE 40 MG TABLET,DELAYED RELEASE
40.0000 mg | DELAYED_RELEASE_TABLET | Freq: Every day | ORAL | 0 refills | Status: DC
Start: 2022-05-17 — End: 2022-06-12
  Filled 2022-05-17: qty 30, 30d supply, fill #0

## 2022-05-17 MED ORDER — TRAZODONE 50 MG TABLET
50.0000 mg | ORAL_TABLET | Freq: Every evening | ORAL | Status: DC
Start: 2022-05-17 — End: 2022-05-20
  Administered 2022-05-17 – 2022-05-19 (×3): 50 mg via ORAL
  Filled 2022-05-17 (×3): qty 1

## 2022-05-17 MED ORDER — TRAZODONE 50 MG TABLET
50.0000 mg | ORAL_TABLET | Freq: Every evening | ORAL | 0 refills | Status: DC
Start: 2022-05-17 — End: 2022-05-28
  Filled 2022-05-17: qty 15, 15d supply, fill #0

## 2022-05-17 MED ORDER — INSULIN LISPRO 100 UNIT/ML SUB-Q SSIP
0.0000 [IU] | INJECTION | Freq: Four times a day (QID) | SUBCUTANEOUS | Status: DC | PRN
Start: 2022-05-17 — End: 2022-06-13

## 2022-05-17 MED ORDER — SPIRONOLACTONE 25 MG TABLET
25.0000 mg | ORAL_TABLET | Freq: Every morning | ORAL | 0 refills | Status: DC
Start: 2022-05-17 — End: 2022-06-12
  Filled 2022-05-17: qty 30, 30d supply, fill #0

## 2022-05-17 MED ORDER — METOPROLOL SUCCINATE ER 25 MG TABLET,EXTENDED RELEASE 24 HR
25.0000 mg | ORAL_TABLET | Freq: Every day | ORAL | 0 refills | Status: DC
Start: 2022-05-17 — End: 2022-06-12
  Filled 2022-05-17: qty 30, 30d supply, fill #0

## 2022-05-17 MED ORDER — IBUPROFEN 200 MG TABLET
600.0000 mg | ORAL_TABLET | Freq: Four times a day (QID) | ORAL | Status: DC | PRN
Start: 2022-05-17 — End: 2022-06-13
  Administered 2022-05-17 – 2022-06-13 (×45): 600 mg via ORAL

## 2022-05-17 MED ORDER — SACUBITRIL 49 MG-VALSARTAN 51 MG TABLET
1.0000 | ORAL_TABLET | Freq: Two times a day (BID) | ORAL | Status: DC
Start: 2022-05-17 — End: 2022-06-13
  Administered 2022-05-17 – 2022-05-20 (×7): 1 via ORAL
  Administered 2022-05-21 – 2022-05-22 (×3): 0 via ORAL
  Administered 2022-05-22 – 2022-06-13 (×44): 1 via ORAL

## 2022-05-17 MED ORDER — FUROSEMIDE 40 MG TABLET
40.0000 mg | ORAL_TABLET | Freq: Every day | ORAL | 0 refills | Status: DC
Start: 2022-05-17 — End: 2022-06-12
  Filled 2022-05-17: qty 30, 30d supply, fill #0

## 2022-05-17 MED ORDER — ASPIRIN-ACETAMINOPHEN-CAFFEINE 250 MG-250 MG-65 MG TABLET
2.0000 | ORAL_TABLET | Freq: Two times a day (BID) | ORAL | Status: DC | PRN
Start: 2022-05-17 — End: 2022-06-13

## 2022-05-17 NOTE — Behavioral Health (Signed)
[facility] Medicine  Center for [place] [facility]  Residential Shift Note     Shift synopsis: [name] attended all scheduled groups.  Socialized and watched TV with peers. They were polite and pleasant with staff and peers.     Mood: Polite and upbeat.     Affect: Consistent with mood.     Phone calls: [name] made no phone calls this shift.     Appetite: [name] ate all meals.      Groups:  [name] was active in all groups.    Azucena Fallen, MHS  05/17/2022, 22:48

## 2022-05-17 NOTE — Group Note (Signed)
Group topic:  ACTIVITY GROUP    Date of group:  05/17/2022  Start time of group:  1430  End time of group:  1500        Summary of group discussion: Group members discussed the [employer]: An "inside" job.       Patient did not attend group and did not have a bed pass.       Johathan Province L Shaleah Nissley, CT  05/17/2022, 15:13
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
## 2022-05-17 NOTE — Group Note (Signed)
Group topic:  PROCESS THERAPY    Date of group:  05/17/2022  Start time of group:  1300  End time of group:  1400               Summary of group discussion: in the group today we discussed how the pts are doing. They discussed how long they have been in tx and what they are expecting to get out of tx. The group processed how groups work and how they can get the most out of them.       Leonard [name]  is a 51 y.o. male participating in a process group.    Affect/Mood:  Appropriate    Thought Process:  Logical    Thought Content:  Within normal limits    Interpersonal:  Discussed issues    Level of participation:  Full    Comments: it appear that pt does attention seeking behavior and is inappropriate with some use of humour.      Nicholos Johns, CT  05/17/2022, 16:30

## 2022-05-17 NOTE — Behavioral Health (Signed)
[facility]  [facility]  Residential Shift Note      Shift synopsis: [name] was pleasant and appropriate with unit staff and other guests. [name] ate all meals and followed all guidelines. [name] attended all group meetings on time.     Mood: [name] was pleasant and appropriate.     Affect: Congruent with mood.    Phone calls: No phone calls made.      Appetite: [name] ate all meals.      Groups: Patient attended all groups.    Leonard [name], MHS

## 2022-05-17 NOTE — Group Note (Signed)
Group topic:  PROCESS THERAPY    Date of group:  05/17/2022  Start time of group:  1000  End time of group:  1130               Summary of group discussion: today in group they were instructed in a psych ed style group about the biology of addiction. The group learned about how the biology of the brain affects their lives and how it is up to them to know and act in different ways to prevent relapse, mat programing was discussed along with attendance in recovery meetings is how one stays clean of street substance.       Leonard [name]  is a 51 y.o. male participating in a process group.    Affect/Mood:  Appropriate    Thought Process:  Logical    Thought Content:  Within normal limits    Interpersonal:  Discussed issues    Level of participation:  Full    Comments:    Nicholos Johns, CT  05/17/2022, 13:28

## 2022-05-17 NOTE — Nurses Notes (Signed)
Client approached nurses station and stated he felt as though he was getting a rash on his chin and his eyes are itching. He stated he feels as though he may be having an allergic reaction. Client denies SOB, tightening or throat or difficulty swallowing. Client denies using or eating any new products or foods. Vistaril 25 mg given for itchiness. Client advised to notify staff if symptoms continue or worsen.

## 2022-05-17 NOTE — Nurses Notes (Signed)
PRN Tylenol  975 mg given for H/A rated 6/10.

## 2022-05-17 NOTE — Progress Notes (Signed)
[facility]  Residential Treatment Unit Interval Progress Note      Patient approached me in between group today to discuss persistent insomnia and depressive symptoms. Discussed starting Trazodone 50 mg nightly to which he agrees. Orders placed.    Arlean Hopping DO, MSc     05/17/2022     14:54  Assistant Professor- Addiction Medicine  Department of Behavioral Medicine and Psychiatry  Pager [phone]

## 2022-05-17 NOTE — Care Plan (Signed)
Patient has participated in all programing today. They did attend all supportive counseling and intensive sessions today. Patient has had appropriate interactions with peers, shared in groups and was in an anxious mood. They have been cooperative with staff and had no problematic behaviors in the milieu. Patient did have individual therapy today in which his psychosocial assessment was completed and treatment plan reviewed. Continue with both groups and individual therapy until discharge.      Please review treatment plan for updated achieved outcomes and evidence for improvement in goals.

## 2022-05-18 ENCOUNTER — Other Ambulatory Visit: Payer: Self-pay

## 2022-05-18 NOTE — Nurses Notes (Signed)
[name] in room resting. No problems noted at this time. Safety rounds completed.  Edvin Albus, LPN

## 2022-05-18 NOTE — Nurses Notes (Signed)
Patient verbalized anxiety and requested PRN. Patient medicated per request. Relief noted.  Rhet Rorke, LPN

## 2022-05-18 NOTE — Group Note (Signed)
Psychoeducational Group Note  Date of group:  05/18/2022  Start time of group:  1000  End time of group:  1130               Summary of group discussion: Group began with a brief feelings check-in. Following check-ins, patients were introduced to values as the topic. Initially, patients defined what values are. Then, they discussed how values change over the course of time, how they are demonstrated (attention, time, focus), and how substances interfere with their abilities to live in congruency with their values. Patients then identified specific values on a handout. Afterwards they chose five or six values of great importance to them and wrote how those values were negatively impacted by their substance use. Finally, patients talked about the changes that they would like to make in order to improve their ability to "live" according to their values.       Leonard [name]  s a 51 y.o. male participating in a psychoeducational group.    Affect/Mood:  Appropriate    Thought Process:  Logical    Thought Content:  Within normal limits    Interpersonal:  Discussed issues, Attentive and Displayed insight    Level of participation:  Full    NEW ISSUES, STRESSORS, EVENTS: Patient did appropriately engage in the group discussion and completed the assigned handouts. Patient shared how his substance abuse negatively affects his values.    THERAPEUTIC INTERVENTIONS USED:    Acceptance and Commitment Therapy - using patient values as a roadmap and direction for living in recovery; cognitive de-fusion  Transtheoretical Model - using stages of change to provide intervention and implement strategies for patient based on their readiness levels.   Motivational Interviewing - reflections, summaries, validation, open-ended questioning, eliciting change-talk, planning for future change.    RESPONSE TO INTERVENTIONS: receptive; engaged.    PROGRESSION TOWARDS GOALS: making progress     STEPS TO TAKE BASED ON PATIENT PROGRESS: Patient will  use understanding of their personal values to increase their feelings of purpose and fulfillment and provide them with more directional living in recovery.    Eusebio Me, [facility] 05/18/2022 11:54

## 2022-05-18 NOTE — Behavioral Health (Signed)
[facility]  [facility]  Residential Shift Note     Shift synopsis: [name] attended all scheduled groups.  Socialized and watched TV with peers. He was polite and pleasant with staff and peers.     Mood: Polite.     Affect: Congruent with mood.     Phone calls: [name] made no calls this shift.     Appetite: [name] ate dinner.      Groups:  [name] was active in all groups.    Jahmir Salo, MHS

## 2022-05-18 NOTE — Behavioral Health (Signed)
[facility]  [facility]  Residential Shift Note     Shift synopsis: [name] was pleasant and appropriate during shift. [name] ate all meals and followed all guidelines. [name] has been spending time with others in the community room.      Mood: Pleasant and appropriate      Affect: congruent with mood    Phone calls: [name] made phone call, it went well.     Appetite: Normal     Groups:  [name] attended offered groups    [name], MHS  05/18/2022, 14:49

## 2022-05-18 NOTE — Group Note (Signed)
Group topic:  ACTIVITY GROUP    Date of group:  05/18/2022  Start time of group:  1830  End time of group:  1925                 Summary of group discussion: The topic of the NA meeting tonight was "One word that describes you and how that relates to your recovery". The chairperson was [place] B.      Leonard [name]  is a 51 y.o. male participating in a activity group.    Patient observations: Leonard [name] was an active participant.      Patient goals:      [name], MHS  05/18/2022, 20:54

## 2022-05-19 NOTE — Nurses Notes (Signed)
[name] in room resting. No problems noted at this time. Safety rounds completed.  Reilley Valentine, LPN

## 2022-05-19 NOTE — Group Note (Signed)
Group topic:  ACTIVITY GROUP    Date of group:  05/19/2022  Start time of group:  1830  End time of group:  1930                 Summary of group discussion: NA group meeting. Topic, "What are you struggling with?"       Leonard [name]  is a 51 y.o. male participating in a activity group.    Patient observations: [name] attended and participated in NA Group.       Patient goals:      Odelia Gage, MHS  05/19/2022, 20:42

## 2022-05-19 NOTE — Behavioral Health (Signed)
[facility]  [facility]  Residential Shift Note 3-11     Shift synopsis: [name] has been pleasant and appropriate this shift. [name] has been spending time socializing with other guest in the community rooms. [name] attended and participated in NA group.      Mood: [name] was pleasant and appropriate      Affect: : Affect appeared to be consistent with mood     Phone calls: No phone call this shift        Appetite: : Appetite was good       Groups: Attended and participated in NA group.        Joellyn Grandt, MHS

## 2022-05-19 NOTE — Behavioral Health (Signed)
[facility]  [facility]  Residential Shift Note     Shift synopsis: [name] attended all scheduled groups.  Socialized and watched TV with peers. They were polite and pleasant with staff and peers.     Mood: Polite and upbeat, well mannered.      Affect: Consistent with mood.     Phone calls: [name] made no phone calls this shift.     Appetite: [name] ate all meals.      Groups:  [name] was active in all groups.    Azucena Fallen, MHS  05/19/2022, 14:33

## 2022-05-19 NOTE — Nurses Notes (Signed)
Client was compliant with care and med pass, no new complaints. Client denied AVH and denied HI/SI.

## 2022-05-20 ENCOUNTER — Encounter (HOSPITAL_COMMUNITY): Payer: Self-pay

## 2022-05-20 ENCOUNTER — Other Ambulatory Visit: Payer: Self-pay

## 2022-05-20 DIAGNOSIS — G4700 Insomnia, unspecified: Secondary | ICD-10-CM

## 2022-05-20 DIAGNOSIS — M2550 Pain in unspecified joint: Secondary | ICD-10-CM

## 2022-05-20 MED ORDER — TROLAMINE SALICYLATE 10 % TOPICAL CREAM
TOPICAL_CREAM | Freq: Four times a day (QID) | CUTANEOUS | Status: DC | PRN
Start: 2022-05-20 — End: 2022-06-13

## 2022-05-20 MED ORDER — TRAZODONE 50 MG TABLET
100.0000 mg | ORAL_TABLET | Freq: Every evening | ORAL | Status: DC
Start: 2022-05-20 — End: 2022-06-13
  Administered 2022-05-20 – 2022-06-12 (×24): 100 mg via ORAL
  Filled 2022-05-20: qty 2

## 2022-05-20 NOTE — Behavioral Health (Signed)
Daily Service Plan Summary  [name] has attended the following clinical services today:  Group Supportive Counseling: 2 hours  Group Professional Therapy:  2 hours    Service Plan development:  1 hour    Larry Sierras, Case Manager 05/20/2022, 14:58

## 2022-05-20 NOTE — Behavioral Health (Signed)
Daily Service Plan Summary  [name] has attended the following clinical services today:  Group Supportive Counseling: 2 hours  Group Professional Therapy:  2 hours    Individual Professional Therapy: 1 hour     Larry Sierras, Case Manager 05/20/2022, 14:58

## 2022-05-20 NOTE — Group Note (Signed)
Group topic:  PROCESS THERAPY    Date of group:  05/20/2022  Start time of group:  1000  End time of group:  1130               Summary of group discussion: today in the group session we discussed the disease model of addiction and co occurring behavioral issues that need to be worked on in order to maintain a healthy life. The group discussed negative thought patterns and how they can discuss their negative thoughts with others and receive more realistic feed back and spend less time berating themselves.  They as well looked at how they can use the jft program to build healthier thought patterns.       Leonard [name]  is a 51 y.o. male participating in a process group.    Affect/Mood:  Appropriate    Thought Process:  Logical    Thought Content:  Within normal limits    Interpersonal:  Discussed issues    Level of participation:  Full    Comments:    Nicholos Johns, [place]  05/20/2022, 15:56

## 2022-05-20 NOTE — Behavioral Health (Signed)
Daily Service Plan Summary  [name] has attended the following clinical services today:  Group Supportive Counseling: 2 hours  Group Professional Therapy: 2 hours      Deseree Zemaitis, Case Manager 05/20/2022, 12:26

## 2022-05-20 NOTE — Nurses Notes (Signed)
[name] in room resting. No problems noted at this time. Safety rounds completed.  Sharnae Winfree, LPN

## 2022-05-20 NOTE — Care Plan (Signed)
Problem: Adult Behavioral Health Plan of Care  Goal: Plan of Care Review  Outcome: Ongoing (see interventions/notes)  Goal: Patient-Specific Goal (Individualization)  Outcome: Ongoing (see interventions/notes)  Goal: Strengths and Vulnerabilities  Outcome: Ongoing (see interventions/notes)  Goal: Adheres to Safety Considerations for Self and Others  Outcome: Ongoing (see interventions/notes)  Goal: Absence of New-Onset Illness or Injury  Outcome: Ongoing (see interventions/notes)  Goal: Optimized Coping Skills in Response to Life Stressors  Outcome: Ongoing (see interventions/notes)  Goal: Develops/Participates in Therapeutic Alliance to Support Successful Transition  Outcome: Ongoing (see interventions/notes)  Goal: Rounds/Family Conference  Outcome: Ongoing (see interventions/notes)     Problem: Residential general plan of care  Goal: LTG- [name] Specific Goal  Outcome: Ongoing (see interventions/notes)  Goal: LTG- [name] will establish and maintain recovery from substance use disorder while increasing knowledge and skillset to manage the symptoms of the disease of addiction and the process of recovery.  Outcome: Ongoing (see interventions/notes)  Goal: LTG- [name] will acquire and practice the necessary skills to maintain long-term recovery from a substance use disorder.  Outcome: Ongoing (see interventions/notes)  Goal: STG- [name] will participate in 100% of morning affirmation and psychoeducational groups provided daily by drug and alcohol technician  Outcome: Ongoing (see interventions/notes)  Goal: STG- [name] will participate in 100% of therapy groups facilitated by clinical therapist  Outcome: Ongoing (see interventions/notes)  Goal: STG- [name] will meet individually with intake coordinator by day three of admission to begin coordinating follow up care  Outcome: Ongoing (see interventions/notes)  Goal: STG- [name] will participate in 100% of experiential groups  Outcome: Ongoing (see  interventions/notes)  Goal: STG- [name] will participate in 100% of recovery meetings that are held on and off the unit  Outcome: Ongoing (see interventions/notes)  Goal: STG- [name] will participate in psychoeducation relating to harm reduction, and will view the naloxone informational video prior to discharge as indicated by treatment team.  Outcome: Ongoing (see interventions/notes)  Goal: STG- [name] will meet individually weekly with clinical therapist as indicated by the client's individual treatment plan.  Outcome: Ongoing (see interventions/notes)  Goal: STG- [name] will receive medication education from the nurse as clinically indicated, to include indication, dose, side effects and importance of compliance  Outcome: Ongoing (see interventions/notes)  Goal: STG- [name] will take prescribed medications as directed by the physician  Outcome: Ongoing (see interventions/notes)  Goal: STG- [name] will explore and resolve ambivalence associated with commitment to change behaviors related to substance use and addiction  Outcome: Ongoing (see interventions/notes)  Goal: STG- [name] will attend individual and group sessions and read assigned material in order to increase knowledge of addiction and the process of recovery  Outcome: Ongoing (see interventions/notes)  Goal: STG [name] Sepcific Goal  Outcome: Ongoing (see interventions/notes)

## 2022-05-20 NOTE — Group Note (Signed)
Group topic:  ACTIVITY GROUP    Date of group:  05/20/2022  Start time of group:  1545  End time of group:  1615                 Summary of group discussion: The topic of the Men A afternoon supportive group was the Just For Today daily meditation, "Using our "using dreams". Guests had an open discussion about how using dreams are normal, talk about how your feeling after having one, do something for your recovery everyday, and different things that guests were taught about them from previous times in recovery. They also went over the different questions that were in the JFT to ask yourself having a using dream.      Leonard [name]  is a 51 y.o. male participating in a activity group.    Patient observations: Leonard [name] was an active participant. He was part of the group discussion and was supportive of his peers.      Patient goals:      [name], MHS  05/20/2022, 16:22

## 2022-05-20 NOTE — Group Note (Signed)
Group topic:  ACTIVITY GROUP    Date of group:  05/20/2022  Start time of group:  1830  End time of group:  1925                 Summary of group discussion: The topic of the NA meeting tonight was "3 positive qualities about yourself and 1 hobby you enjoy". The chairperson was [place] B.      Leonard [name]  is a 51 y.o. male participating in a activity group.    Patient observations: [name] was an active participant.       Patient goals:      [name], MHS  05/20/2022, 20:45

## 2022-05-20 NOTE — Behavioral Health (Signed)
[facility]  [facility]  Residential Shift Note 7-3     Shift Synopsis: [name] was pleasant and appropriate this shift. [name] has been spending time socializing with other guests in the community rooms. [name] followed guidelines and was appropriate with staff.     Mood: [name] was pleasant and appropriate.      Affect: Affect appeared to be consistent with mood.       Phone calls: [name] did not make a phone call this shift.     Appetite: Appetite was good; guest ate well at breakfast and lunch.      Sleep: [name] did not sleep this shift.      Groups: [name] attended and participated in all groups.     Shakai Dolley, MHS

## 2022-05-20 NOTE — Progress Notes (Signed)
[facility]  Residential Treatment Unit Interval Progress Note      Patient approached me in between group today to discuss insomnia and persistent chronic joint pain. Discussed starting trolamine salicylate 10% cream 4x/day prn and increasing Trazodone from 50 mg to 100 mg nightly to which he agrees, Orders placed.    Arlean Hopping DO, MSc     05/20/2022     15:15  Assistant Professor- Addiction Medicine  Department of Behavioral Medicine and Psychiatry  Pager [phone]

## 2022-05-20 NOTE — Behavioral Health (Signed)
Daily Service Plan Summary  [name] has attended the following clinical services today:  Group Supportive Counseling: 2 hours  Group Professional Therapy: 2 hours      Amaira Safley, Case Manager 05/20/2022, 12:26

## 2022-05-20 NOTE — Behavioral Health (Signed)
[facility]  [facility]  Residential Shift Note     Shift synopsis: [name] attended all scheduled groups.  Socialized and watched TV with peers. He was polite and pleasant with staff and peers.     Mood: Polite.     Affect: Congruent with mood.     Phone calls: [name] made no calls this shift.     Appetite: [name] ate dinner.      Groups:  [name] was active in all groups.    Brietta Manso, MHS

## 2022-05-21 ENCOUNTER — Other Ambulatory Visit: Payer: Self-pay

## 2022-05-21 NOTE — Ancillary Notes (Signed)
[facility] - INDIVIDUAL THERAPY PROGRESS NOTE    NAME: Leonard [name]  DOB: 1971/10/03  DATE: 05/21/2022  MRN: [id number]  SESSION DURATION: 30    CHIEF COMPLAINT: Patient is a 51 y.o. 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     PATIENT'S REPORT OF PROGRESS SINCE LAST SESSION: this is adjusting to the unit and is starting to open up in groups.    STRESSORS/EVENTS/NEW ISSUES DISCUSSED IN SESSION: pt is homeless and unenployed    OBJECTIVE:      Appearance: groomed   Mood: pleasant   Affect: wnl   Thought process: linear   Thought content: wnl    GOALS/OBJECTIVES ADDRESSED:     Objective from treatment plan: pt will identify a long term recovery plan    Was the objective achieved? no     How is the patient progressing towards treatment goals?    Continue or Revise Plan? continue.    THERAPEUTIC INTERVENTIONS USED:    Transtheoretical Model of Change - facilitating the process of change and improving motivation in patients based on their current stage of change by building on knowledge and raising awareness about addiction as a brain disease.  Motivational Interviewing - rolling with resistance, using reflections and summaries to engage patients effectively, and identifying and exploring patient ambivalence; eliciting change-talk in patients  Psychoeducation - using research-based, factual information about addiction/recovery as a brain disease to instill knowledge into patients  Cognitive Behavioral Therapy- aid in problem solving and correlating between the beliefs, thoughts and feelings and the behaviors that follow.   Solution Focused Therapy-focus on setting clear, concise and realistic goals; help patients discover and accomplish their own solutions to problems.   Narrative Therapy- assist patients in identifying their strengths and allowing them to be the expert in their own life.   Relapse Prevention- The purpose of a relapse prevention plan is to help  the patient understand their own personal warning signs. These warning signs are specific to each person and can help the patient identify when their mental health condition may be starting to return so they can get help sooner.     PATIENT'S RESPONSE TO INTERVENTIONS: active        ASAM Criteria for 3.5 Level of Care   ASAM Level of Care:  ASAM Dimension 1 (Acute Intoxication and/or Withdrawal Potential): Patient is at minimal risk of withdrawal. He had not drank or used benzo's in over one week prior to admission and had smoked marijuana the day before admission. They have had no illicit UDSs while in treatment, and there is no evidence of ongoing use.  ASAM Dimension 2 (Biomedical Conditions/Complications): Patient has multiple physical health issues includingCongestive Heart Failure, Non Insulin Dependent DM Type 2,HTN, GERD, Cellulitis. He appears older than stated age and has elevated BMI. While their medical diagnoses currently do not interfere with his ability to participate in treatment, it is highly likely to interfere if patient relapses.   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  ASAM Dimension 4 (Readiness to Change): Stage of change appears to be contemplation. Patient has been working towards setting up a healthy environment after discharge, and has identified that [employer] following discharge could be a healthy choice. Patient is active in setting up additional treatment for long-term care following discharge.   ASAM Dimension 5 (Relapse, Continued Use, or  Continued Problem Potential): Risk of relapse is high. Patient is struggling with Chronic Substance Abuse, Poor Coping Skills, Psychosocial Stressors,Psychiatric and Medical Comorbidities, Unemployment. Additional interventions may be  necessary to assist with long-term recovery.          ASAM Dimension 6 (Recovery/Living Environment): Risk ismoderate. Patient currently has a unstable living environment with              his brother but he reports having used substances in this context.    DC Plan: pt is filling out long term tx applications.     Nicholos Johns, [place]     signature with credentials

## 2022-05-21 NOTE — Behavioral Health (Signed)
[facility]  [facility]  Residential Shift Note     Shift synopsis: [name] was pleasant and appropriate during shift. [name] ate all meals and followed all guidelines. [name] has been spending time with others in the community room.      Mood: Pleasant and appropriate      Affect: congruent with mood    Phone calls: [name] made no phone call     Appetite: Normal     Groups:  [name] attended offered groups    [name], MHS  05/21/2022, 20:47

## 2022-05-21 NOTE — Group Note (Signed)
Group topic:  ACTIVITY GROUP    Date of group:  05/21/2022  Start time of group:  1830  End time of group:  1930                 Summary of group discussion: NA      Leonard [name]  is a 51 y.o. male participating in a activity group.    Patient observations: Patient was on time and participated in group        Patient goals:      Leonard [name], MHS  05/21/2022, 20:38

## 2022-05-21 NOTE — Group Note (Signed)
Group topic:  PROCESS THERAPY    Date of group:  05/21/2022  Start time of group:  1300  End time of group:  1400               Summary of group discussion: In group today we took a co dependency assessment the group then looked at their scores. All member scored moderate to severe. The group members discussed how they were surprised by their scores and didn't realize how severe of an issues that they have. They discussed the difficulties in their relationships and how they have been in one toxic relationship after another.       Marrell Dicaprio  is a 51 y.o. male participating in a process group.    Affect/Mood:  Appropriate    Thought Process:  Logical    Thought Content:  Within normal limits    Interpersonal:  Discussed issues    Level of participation:  Full    Comments:    Nicholos Johns, CT  05/21/2022, 15:55

## 2022-05-21 NOTE — Group Note (Signed)
Group topic:  EXPERIENTIAL THERAPY/ACTIVITY GROUP    Date of group:  05/21/2022  Start time of group:  1000  End time of group:  1130      Summary of group: Experiential therapy is a therapeutic technique in which expressive tools and activities are used to allow patients to re-enact and re-experience emotional situations in a safe environment. The primary focus is trigger "eustress" or good stress to show that patients are capable of adapting and changing behaviors. It also aids in the development of community and takes into the account the needs of others in order to be successful.      Group Activity: Toxic Waste   Affect: Guarded  Mood: Anxious    Observed behavior:  Participation in group: Participates With Maximum Encouragement (Requires the focus of the group/therapist to take minimal risks 80-100% of the time, then only partially engages)  Interaction with peers: Sometimes (Will respond to direct questions from peers although does not initiate interactions)  Able to recognize and express feelings: Sometimes (Is beginning to take risks on safer issues and will share feelings appropriately at times with prompting)  Following instructions: Carries Out Simple One-Step Directions  Instruction type: Verbal and Written  Stays on task: 20 MInutes  Solves problems: Minimal Awareness That Difficulties Exist  Copes with frustration and anxiety: Minimal Coping (Is beginning to work on Company [employer], by attempting  tasks although is quick to withdraw/ give up at the first percieved sign of stress)  Works cooperatively in a group: Seldom (Will not work the group 90-100% of the time, unwilling to compromise)      Note: Patient did not participate in the group activity. He instead had a side conversation with Ian Malkin..             Anderson Malta, [place]  05/21/2022, 12:22

## 2022-05-21 NOTE — Behavioral Health (Signed)
[facility]  [facility]  Residential Shift Note 7-3     Shift Synopsis: [name] was pleasant and appropriate this shift. [name] has been spending time socializing with other guests in the community rooms. [name] followed guidelines and was appropriate with staff.     Mood: [name] was pleasant and appropriate.      Affect: Affect appeared to be consistent with mood.       Phone calls: [name] did not make a phone call this shift.     Appetite: Appetite was good; guest ate well at breakfast and lunch.      Sleep: [name] did not sleep this shift.      Groups: [name] attended and participated in all groups.     Katha Kuehne, MHS

## 2022-05-22 ENCOUNTER — Other Ambulatory Visit: Payer: Self-pay

## 2022-05-22 MED ORDER — ENTRESTO 49 MG-51 MG TABLET
1.0000 | ORAL_TABLET | Freq: Two times a day (BID) | ORAL | 0 refills | Status: DC
Start: 2022-05-22 — End: 2022-05-23
  Filled 2022-05-22: qty 60, 30d supply, fill #0

## 2022-05-22 NOTE — Behavioral Health (Signed)
[facility]  [facility]  Residential Shift Note     Shift Synopsis:  [name] was pleasant and appropriate with unit staff and other guests.  [name] ate all meals and followed all guidelines.  [name] attended and participated in group.     Mood: [name] was pleasant and appropriate     Affect: Congruent with mood     Phone calls: [name] made no calls this shift     Appetite: [name] ate all meals     Groups:  [name] attended and participated in NA group.     Lashelle Koy, MHS

## 2022-05-22 NOTE — Group Note (Signed)
Group topic:  ACTIVITY GROUP    Date of group:  05/22/2022  Start time of group:  1830  End time of group:  1930                 Summary of group discussion:  NA Meeting topic was tell about a person you admire and why?        Leonard [name]  is a 51 y.o. male participating in a activity group.    Patient observations:  [name] attend and participated in NA group meeting.      Patient goals:      [name], MHS  05/22/2022, 20:47

## 2022-05-22 NOTE — Group Note (Signed)
Group topic:  PROCESS THERAPY    Date of group:  05/22/2022  Start time of group:  1000  End time of group:  1130               Summary of group discussion:    Group began with an opening check-in followed by a reading from "Don't Sweat the Small Stuff". The reading was entitled "Do a Favor and Don't Ask For, or Expect, One in Return". It was about ways to integrate acts of service in your life. This Clinical research associate led the group through a journaling activity in which the prompt was given "your problem is not drugs and alcohol, you're problem is your thinking, attitude and behavior". Participants were to write down their responses to this and then discuss them. Group discussion focused on how thoughts become feelings which in turn lead to behaviors and how to make healthier choices early on and correct mistakes early.            Leonard [name]  is a 51 y.o. male participating in a process group.    Affect/Mood:  Appropriate    Thought Process:  Logical    Thought Content:  Within normal limits    Interpersonal:  Attentive    Level of participation:  Full    Comments:    Kayleen Memos, CLINICAL THERAPIST  05/22/2022, 15:13

## 2022-05-22 NOTE — Behavioral Health (Signed)
[facility]  [facility]  Residential Shift Note 7-3     Shift Synopsis: [name] was pleasant and appropriate this shift. [name] has been spending time socializing with other guests in the community rooms. [name] followed guidelines and was appropriate with staff.     Mood: [name] was pleasant and appropriate.      Affect: Affect appeared to be consistent with mood.       Phone calls: [name] did not make a phone call this shift.     Appetite: Appetite was good; guest ate well at breakfast and lunch.      Sleep: [name] did not sleep this shift.      Groups: [name] attended and participated in all groups.     Tremain Rucinski, MHS

## 2022-05-23 ENCOUNTER — Other Ambulatory Visit: Payer: Self-pay

## 2022-05-23 MED ORDER — ENTRESTO 49 MG-51 MG TABLET
1.0000 | ORAL_TABLET | Freq: Two times a day (BID) | ORAL | 0 refills | Status: DC
Start: 2022-05-23 — End: 2022-06-12
  Filled 2022-05-23: qty 60, 30d supply, fill #0

## 2022-05-23 NOTE — Group Note (Signed)
Group topic:  ACTIVITY GROUP    Date of group:  05/23/2022  Start time of group:  1830  End time of group:  1930                 Summary of group discussion:  NA group meeting, Topic:  What advice would you give your childhood self, why?  [name] was Nathanie Ottley  is a 51 y.o. male participating in a activity group.    Patient observations:  [name] attended and participated in NA group meeting.      Patient goals:      [name], MHS  05/23/2022, 21:41

## 2022-05-23 NOTE — Behavioral Health (Signed)
[facility]  [facility]  Residential Shift Note 7-3     Shift Synopsis: [name] was pleasant and appropriate this shift. [name] has been spending time socializing with other guests in the community rooms. [name] followed guidelines and was appropriate with staff.     Mood: [name] was pleasant and appropriate.      Affect: Affect appeared to be consistent with mood.       Phone calls: [name] did not make a phone call this shift.     Appetite: Appetite was good; guest ate well at breakfast and lunch.      Sleep: [name] did not sleep this shift.      Groups: [name] attended and participated in all groups.     Velma Agnes, MHS

## 2022-05-23 NOTE — Group Note (Signed)
Group topic:  ACTIVITY GROUP    Date of group:  05/23/2022  Start time of group:  0930  End time of group:  1000                 Summary of group discussion: Step One (Just for today)      Leonard [name]  is a 51 y.o. male participating in a activity group.    Patient observations:      Patient goals:      Donnella Bi, MHS  05/23/2022, 09:41

## 2022-05-23 NOTE — Group Note (Signed)
Group topic:  PROCESS THERAPY    Date of group:  05/23/2022  Start time of group:  1000  End time of group:  1130               Summary of group discussion: Patients began group with a brief feelings check-in using the mood meter. Patients then completed a "Life Compass" activity sheet in which they identified and expanded on what they find to be meaningful in their lives. Patients expanded on specific domains in their lives including: "parenting, personal growth, leisure, spirituality, health, work, community/environment, family relationships, intimate relationships, and social relationships." Patients rated how important each of these domains are to them on a scale of 1-10. Then, they rated how effectively they are living according to these values on a scale of 1-10. Patients then reflected on their worksheet and discussed what they discovered is important to them and, additionally, discovered what domains of their lives they are currently neglecting.       Leonard [name]  is a 51 y.o. male participating in a process group.    Affect/Mood:  Anxious    Thought Process:  Logical    Thought Content:  Within normal limits    Interpersonal:  Discussed issues, Attentive and Displayed insight    Level of participation:  Full    NEW ISSUES, STRESSORS, EVENTS: Patient expressed feeling "anxious and some pain" at check-in. He explained that "the fog has started to lift and so I'm realizing that I'm experiencing anxiety and a lot of physical pain that has been numbed for awhile." Patient identified that he has neglected his "health" over the years as evidenced by the pain his is experiencing now. He also shared that he recognizes that his health "internally" has been neglected.    THERAPEUTIC INTERVENTIONS USED:    Acceptance and Commitment Therapy - using patient values as a roadmap and direction for living in recovery; helping patient embrace the emotional pain and suffering that comes with being a person in the early stages  of recovery; cognitive de-fusion techniques; aiding patient in finding meaning and purpose in a life of recovery  Motivational Interviewing - eliciting change talk and identifying/exploring barriers to change; open-ended questioning, validation, reflective listening, and summaries    RESPONSE TO INTERVENTIONS: receptive; apprehensive.    PROGRESSION TOWARDS GOALS: making progress     STEPS TO TAKE BASED ON PATIENT PROGRESS: Patient will continue to lean on and employ personal values in order to provide purpose, meaning, and direction    [name], LPC  05/23/2022, 12:17

## 2022-05-23 NOTE — Behavioral Health (Signed)
[facility]  [facility]  Residential Shift Note     Shift Synopsis:  [name] was pleasant and appropriate with unit staff and other guests.  [name] ate all meals and followed all guidelines.  [name] attended and participated in group.     Mood: [name] was pleasant and appropriate     Affect: Congruent with mood     Phone calls: [name] made no calls this shift     Appetite: [name] ate all meals     Groups:  [name] attended and participated in NA group.     Emer Onnen, MHS

## 2022-05-23 NOTE — Nurses Notes (Signed)
Reassessment of client's pain states some relief from sports creme and follow-up 975 mg Tylenol.

## 2022-05-23 NOTE — Group Note (Signed)
Group topic:  PROCESS THERAPY    Date of group:  05/23/2022  Start time of group:  1300  End time of group:  1400               Summary of group discussion: in the group today the pts discussed how they are doing and how they are working on their recovery. Pts then read the na book on chapter 5 the group then discussed how this applies to them and what they found most helpful out of the reading.       Leonard [name]  is a 51 y.o. male participating in a process group.    Affect/Mood:  Appropriate    Thought Process:  Logical    Thought Content:  Within normal limits    Interpersonal:  Discussed issues    Level of participation:  Full    Comments:    Leonard [name], [place]  05/23/2022, 16:24

## 2022-05-23 NOTE — Nurses Notes (Signed)
Client states he has continuous pain in his Left hip and bilateral knees.; PRN Ibuprofen and sorts creme given.

## 2022-05-24 NOTE — Behavioral Health (Signed)
[facility]  [facility]  Residential Shift Note     Shift Synopsis:  [name] was pleasant and appropriate with unit staff and other guests.  [name] ate all meals and followed all guidelines.  [name] attended and participated in social hour.     Mood: [name] was pleasant and appropriate     Affect: Congruent with mood     Phone calls: [name] made no calls this shift     Appetite: [name] ate all meals     Groups:  [name] attended and participated in social hour.  [name] attended and participated in supportive group.      Nathan Moctezuma, MHS

## 2022-05-24 NOTE — Nurses Notes (Signed)
[name] in room resting. No problems noted at this time. Safety rounds completed.  Jes Costales, LPN

## 2022-05-24 NOTE — Group Note (Signed)
Psychoeducational Group Note  Date of group:  05/24/2022  Start time of group:  1000  End time of group:  1115      Summary of group discussion: Patients reviewed and discussed Am I an Architect.       Leonard [name]  s a 51 y.o. male participating in a psychoeducational group.    Affect/Mood:  Appropriate    Thought Process:  Goal directed    Thought Content:  Within normal limits    Interpersonal:  Attentive    Level of participation:  Full    Comments:     Anderson Malta, [place] 05/24/2022 12:50

## 2022-05-24 NOTE — Group Note (Signed)
Group topic:  ACTIVITY GROUP    Date of group:  05/24/2022  Start time of group:  1545  End time of group:  1645                 Summary of group discussion:  [name] attended and participated in supportive group.  Topic was coping skills 1 to 84.  Read and discussed coping skills and related topic to the journey of recovery.      Leonard [name]  is a 51 y.o. male participating in a activity group.    Patient observations: [name] attended and participated in supportive group.       Patient goals:      [name], MHS  05/24/2022, 16:58

## 2022-05-24 NOTE — Nurses Notes (Signed)
PRN Tylenol 975 mg given for Left Hip Pain

## 2022-05-24 NOTE — Group Note (Signed)
Group topic:  PROCESS THERAPY    Date of group:  05/24/2022  Start time of group:  1430  End time of group:  1530               Summary of group discussion: in the group session today we discussed negatives of addiction, the group looked at the need for change due to the severity of the drugs they have been using and the amount of continued negatives that they have now in their life.       Worth Kober  is a 51 y.o. male participating in a process group.    Affect/Mood:  Appropriate    Thought Process:  Logical    Thought Content:  Within normal limits    Interpersonal:  Discussed issues    Level of participation:  Full    Comments:    Nicholos Johns, [place]  05/24/2022, 16:10

## 2022-05-25 NOTE — Behavioral Health (Signed)
[facility]  [facility]  Residential Shift Note     Shift Synopsis:  [name] was pleasant and appropriate with unit staff and other guests.  [name] ate all meals and followed all guidelines.  [name] attended and participated in group.     Mood: [name] was pleasant and appropriate     Affect: Congruent with mood     Phone calls: [name] made no calls this shift     Appetite: [name] ate all meals     Groups:  [name] attended and participated in NA group.     Josemiguel Gries, MHS

## 2022-05-25 NOTE — Group Note (Signed)
Group topic:  PROCESS THERAPY    Date of group:  05/25/2022  Start time of group:  1000  End time of group:  1115               Summary of group discussion:    This Clinical research associate showed an hour-long video posted by Robie Ridge, former Mixed Chief Financial Officer and professional physical fitness trainer. In the video, Mr. 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 Following the video, participants were encouraged to share about the parts of his story to which they related, how learning his story can help with recovery, as well as questions they would ask him if they had the opportunity.           Leonard [name]  is a 51 y.o. male participating in a process group.    Affect/Mood:  Appropriate    Thought Process:  Logical    Thought Content:  Within normal limits    Interpersonal:  Attentive and Provided feedback    Level of participation:  Full    Comments:    Kayleen Memos, CLINICAL THERAPIST  05/25/2022, 15:41

## 2022-05-25 NOTE — Group Note (Signed)
Group topic:  ACTIVITY GROUP    Date of group:  05/25/2022  Start time of group:  0845  End time of group:  0925                 Summary of group discussion:    Just 4 today topic for 7/22, killing the old self and re-submerging as someone new.     All members participated.     Azucena Fallen, MHS        Leonard [name]  is a 51 y.o. male participating in a activity group.    Patient observations:      Patient goals:      Azucena Fallen, MHS  05/25/2022, 10:52

## 2022-05-25 NOTE — Behavioral Health (Signed)
[facility]  [facility]  Residential Shift Note     Shift synopsis: [name] was pleasant and appropriate during shift. [name] ate all meals and followed all guidelines. [name] has been spending time with others in the community room.      Mood: Pleasant and appropriate      Affect: congruent with mood     Phone calls: [name] did not make a phone call.     Appetite: Normal     Groups:  [name] attended offered groups    Herschell Virani, MHS

## 2022-05-25 NOTE — Group Note (Signed)
Group topic:  ACTIVITY GROUP    Date of group:  05/25/2022  Start time of group:  1830  End time of group:  1930                 Summary of group discussion: The topic of the NA Meeting tonight was "When did you realize you needed help?" The chairperson was Firas Guardado  is a 51 y.o. male participating in a activity group.    Patient observations: [name] was an active participant.      Patient goals:      [name], MHS  05/25/2022, 20:22

## 2022-05-26 NOTE — Behavioral Health (Signed)
[facility]  [facility]  Residential Shift Note     Shift synopsis: [name] attended all scheduled groups.  Socialized and watched TV with peers. He was polite and pleasant with staff and peers.     Mood: Polite and upbeat.     Affect: Congruent with mood.     Phone calls: [name] made no calls this shift.     Appetite: [name] ate dinner.      Groups:  [name] was active in all groups.    Birdie Beveridge, MHS

## 2022-05-26 NOTE — Nurses Notes (Signed)
[name] in room resting. No problems noted at this time. Safety rounds completed.  Verdia Bolt, LPN

## 2022-05-26 NOTE — Behavioral Health (Signed)
[facility]  [facility]  Residential Shift Note     Shift synopsis: [name] attended all scheduled groups.  Socialized and watched TV with peers. They were polite and pleasant with staff and peers.     Mood: Polite and upbeat.     Affect: Consistent with mood.     Phone calls: [name] made no phone calls this shift.     Appetite: [name] ate all meals.      Groups:  [name] was active in all groups.    Azucena Fallen, MHS  05/26/2022, 15:00

## 2022-05-26 NOTE — Group Note (Signed)
Group topic:  ACTIVITY GROUP    Date of group:  05/26/2022  Start time of group:  1830  End time of group:  1925                 Summary of group discussion: The topic of the NA meeting was "What are some healthy goals to pursue now that you are in recovery?" The chairperson was Leonard [name]  is a 51 y.o. male participating in a activity group.    Patient observations: Leonard [name] was an active participant.      Patient goals:      Leonard [name], MHS  05/26/2022, 19:42

## 2022-05-27 ENCOUNTER — Encounter (HOSPITAL_COMMUNITY): Payer: Self-pay

## 2022-05-27 NOTE — Group Note (Signed)
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 Various topics were addressed such as: meeting etiquette, the importance of regular meeting attendance, sponsorship and step work.       Kym Fenter  s a 51 y.o. male participating in a psychoeducational group.    Affect/Mood:  Appropriate    Thought Process:  Logical    Thought Content:  Within normal limits    Interpersonal:  Attentive    Level of participation:  Full    Comments:     Kathee Delton, CT 05/27/2022 15:44

## 2022-05-27 NOTE — Care Plan (Signed)
Problem: Adult Behavioral Health Plan of Care  Goal: Plan of Care Review  Outcome: Ongoing (see interventions/notes)  Goal: Patient-Specific Goal (Individualization)  Outcome: Ongoing (see interventions/notes)  Goal: Strengths and Vulnerabilities  Outcome: Ongoing (see interventions/notes)  Goal: Adheres to Safety Considerations for Self and Others  Outcome: Ongoing (see interventions/notes)  Goal: Absence of New-Onset Illness or Injury  Outcome: Ongoing (see interventions/notes)  Goal: Optimized Coping Skills in Response to Life Stressors  Outcome: Ongoing (see interventions/notes)  Goal: Develops/Participates in Therapeutic Alliance to Support Successful Transition  Outcome: Ongoing (see interventions/notes)  Goal: Rounds/Family Conference  Outcome: Ongoing (see interventions/notes)     Problem: Residential general plan of care  Goal: LTG- [name] Specific Goal  Outcome: Ongoing (see interventions/notes)  Goal: LTG- [name] will establish and maintain recovery from substance use disorder while increasing knowledge and skillset to manage the symptoms of the disease of addiction and the process of recovery.  Outcome: Ongoing (see interventions/notes)  Goal: LTG- [name] will acquire and practice the necessary skills to maintain long-term recovery from a substance use disorder.  Outcome: Ongoing (see interventions/notes)  Goal: STG- [name] will participate in 100% of morning affirmation and psychoeducational groups provided daily by drug and alcohol technician  Outcome: Ongoing (see interventions/notes)  Goal: STG- [name] will participate in 100% of therapy groups facilitated by clinical therapist  Outcome: Ongoing (see interventions/notes)  Goal: STG- [name] will meet individually with intake coordinator by day three of admission to begin coordinating follow up care  Outcome: Ongoing (see interventions/notes)  Goal: STG- [name] will participate in 100% of experiential groups  Outcome: Ongoing (see  interventions/notes)  Goal: STG- [name] will participate in 100% of recovery meetings that are held on and off the unit  Outcome: Ongoing (see interventions/notes)  Goal: STG- [name] will participate in psychoeducation relating to harm reduction, and will view the naloxone informational video prior to discharge as indicated by treatment team.  Outcome: Ongoing (see interventions/notes)  Goal: STG- [name] will meet individually weekly with clinical therapist as indicated by the client's individual treatment plan.  Outcome: Ongoing (see interventions/notes)  Goal: STG- [name] will receive medication education from the nurse as clinically indicated, to include indication, dose, side effects and importance of compliance  Outcome: Ongoing (see interventions/notes)  Goal: STG- [name] will take prescribed medications as directed by the physician  Outcome: Ongoing (see interventions/notes)  Goal: STG- [name] will explore and resolve ambivalence associated with commitment to change behaviors related to substance use and addiction  Outcome: Ongoing (see interventions/notes)  Goal: STG- [name] will attend individual and group sessions and read assigned material in order to increase knowledge of addiction and the process of recovery  Outcome: Ongoing (see interventions/notes)  Goal: STG [name] Sepcific Goal  Outcome: Ongoing (see interventions/notes)

## 2022-05-27 NOTE — Behavioral Health (Signed)
[facility]  [facility]  Residential Shift Note     Shift synopsis: [name] attended all scheduled groups.  Socialized and watched TV with peers. He was polite and pleasant with staff and peers.     Mood: Polite and upbeat.     Affect: Congruent with mood.     Phone calls: [name] made no calls this shift.     Appetite: [name] ate dinner.      Groups:  [name] was active in all groups.    Prateek Knipple, MHS

## 2022-05-27 NOTE — Group Note (Signed)
Group topic:  ACTIVITY GROUP    Date of group:  05/27/2022  Start time of group:  1830  End time of group:  1930                 Summary of group discussion: The topic of the NA meeting tonight was "How have you changed since getting into recovery." The chairperson was Leonard B.      Leonard [name]  is a 51 y.o. male participating in a activity group.    Patient observations: Leonard [name] was an active participant.      Patient goals:      Leonard [name], MHS  05/27/2022, 20:51

## 2022-05-27 NOTE — Behavioral Health (Signed)
[facility]  [facility]  Residential Shift Note 7-3     Shift Synopsis: [name] was pleasant and appropriate this shift. [name] has been spending time socializing with other guests in the community rooms. [name] followed guidelines and was appropriate with staff.     Mood: [name] was pleasant and appropriate.      Affect: Affect appeared to be consistent with mood.       Phone calls: [name] did not make a phone call this shift.     Appetite: Appetite was good; guest ate well at breakfast and lunch.      Sleep: [name] did not sleep this shift.      Groups: [name] attended and participated in all groups.     Mclean Moya, MHS

## 2022-05-27 NOTE — Group Note (Signed)
Group topic:  ACTIVITY GROUP    Date of group:  05/27/2022  Start time of group:  1545  End time of group:  1620                 Summary of group discussion: The topic of the afternoon Men A supportive group was the Just For Today daily meditation, "The masks have to go". Guests had an open discussion about self-honesty, self-acceptance, and humility being the acceptance of your assets and liabilities.      Bradlee Heitman  is a 51 y.o. male participating in a activity group.    Patient observations: [name] was an active participant. He was part of the group discussion and was supportive of his peers.      Patient goals:      [name], MHS  05/27/2022, 16:34

## 2022-05-28 ENCOUNTER — Other Ambulatory Visit: Payer: Self-pay

## 2022-05-28 MED ORDER — TRAZODONE 100 MG TABLET
100.0000 mg | ORAL_TABLET | Freq: Every evening | ORAL | 0 refills | Status: DC
Start: 2022-05-28 — End: 2022-06-12
  Filled 2022-05-28: qty 30, 30d supply, fill #0

## 2022-05-28 NOTE — Group Note (Signed)
Group topic:  PROCESS THERAPY    Date of group:  05/28/2022  Start time of group:  1300  End time of group:  1400               Summary of group discussion: today in the group session we watched the video on step one from father [place]. The group discussed twelve step recovery and how they intend on going to meetings once they leave here and start doing a sober life.       Leonard [name]  is a 51 y.o. male participating in a process group.    Affect/Mood:  Appropriate    Thought Process:  Logical    Thought Content:  Within normal limits    Interpersonal:  Discussed issues    Level of participation:  Full    Comments:    Nicholos Johns, [place]  05/28/2022, 16:03

## 2022-05-28 NOTE — Group Note (Signed)
Group topic:  ACTIVITY GROUP    Date of group:  05/28/2022  Start time of group:  1830  End time of group:  1930                 Summary of group discussion:  NA group meeting H&I  The topic for tonight was recovery and relapse.  H&I group shared their journey of addiction with the our guest.      Leonard [name]  is a 51 y.o. male participating in a activity group.    Patient observations:  [name] attended and participated in NA group meeting.      Patient goals:      [name], MHS  05/28/2022, 20:40

## 2022-05-28 NOTE — Behavioral Health (Signed)
[facility]  [facility]  Residential Shift Note     Shift synopsis: [name] was pleasant and appropriate during shift. [name] ate all meals and followed all guidelines. [name] has been spending time with others in the community room.      Mood: Pleasant and appropriate      Affect: congruent     Phone calls:  none     Appetite: Normal     Groups:  [name] attended offered groups

## 2022-05-28 NOTE — Behavioral Health (Addendum)
[facility]  [facility]  Residential Shift Note     Shift Synopsis:  [name] was pleasant and appropriate with unit staff and other guests.  [name] ate all meals and followed all guidelines.  [name] attended and participated in group.     Mood: [name] was pleasant and appropriate     Affect: Congruent with mood     Phone calls: [name] made a call to family and call was good.     Appetite: [name] ate all meals     Groups:  [name] attended and participated in NA group.     Aryanne Gilleland, MHS

## 2022-05-28 NOTE — Group Note (Signed)
Group topic:  ACTIVITY GROUP    Date of group:  05/28/2022  Start time of group:  0845  End time of group:  0910                 Summary of group discussion: Group was revolved around the "Just for Today". The topic of today's group was "Twelfth Step Failure". It was a very productive and mutual group.      Leonard [name]  is a 51 y.o. male participating in a activity group.    Patient observations: Patient attended and participated in group.       Lucia Bitter, MHS  05/28/2022, 09:13

## 2022-05-28 NOTE — Group Note (Signed)
Group topic:  EXPERIENTIAL THERAPY/ACTIVITY GROUP    Date of group:  05/28/2022  Start time of group:  1000  End time of group:  1130      Summary of group: Experiential therapy is a therapeutic technique in which expressive tools and activities are used to allow patients to re-enact and re-experience emotional situations in a safe environment. The primary focus is trigger "eustress" or good stress to show that patients are capable of adapting and changing behaviors. It also aids in the development of community and takes into the account the needs of others in order to be successful.      Group Activity: Pipeline  Affect: Restricted  Mood: Anxious    Observed behavior:  Participation in group: Participates With Moderate Encouragement (Displays hesitancy to engage although is requiring less prompting/encouragement from others)  Interaction with peers: Usually (Is beginning to initiate appropriate interactions although avoids more sensitive issues)  Able to recognize and express feelings: Sometimes (Is beginning to take risks on safer issues and will share feelings appropriately at times with prompting)  Following instructions: Carries Out Simple One-Step Directions  Instruction type: Verbal  Stays on task: 20 MInutes  Solves problems: Frequently Requires Assistance  Copes with frustration and anxiety: Frequently Needs Assistance (Requires a lot of prompting and encouragement although is able to stay engaged with this help)  Works cooperatively in a group: Usually (Appears willing to work with over half of the group members and is displaying improvement in compromising skills)      Note: Patient was hesitant to participate in the group activity, however his peers encouraged him to join and he did so.             Anderson Malta, CT  05/28/2022, 11:57

## 2022-05-29 ENCOUNTER — Other Ambulatory Visit: Payer: Self-pay

## 2022-05-29 MED ORDER — LIDOCAINE 4 % TOPICAL PATCH
1.0000 | MEDICATED_PATCH | Freq: Every day | CUTANEOUS | Status: DC
Start: 2022-05-30 — End: 2022-06-04
  Administered 2022-05-30 – 2022-06-01 (×3): 1 via TRANSDERMAL
  Administered 2022-06-02: 0 via TRANSDERMAL
  Administered 2022-06-03 – 2022-06-04 (×2): 1 via TRANSDERMAL

## 2022-05-29 MED ORDER — LIDOCAINE 4 % TOPICAL PATCH
1.0000 | MEDICATED_PATCH | Freq: Every day | CUTANEOUS | 0 refills | Status: DC
Start: 2022-05-30 — End: 2022-06-12
  Filled 2022-05-29: qty 12, 12d supply, fill #0

## 2022-05-29 MED ORDER — LIDOCAINE 3.6 %-MENTHOL 1.25 % TOPICAL PATCH
1.0000 | MEDICATED_PATCH | Freq: Every day | CUTANEOUS | Status: AC
Start: 2022-05-29 — End: 2022-05-30
  Administered 2022-05-29: 1 via TRANSDERMAL
  Filled 2022-05-29: qty 1

## 2022-05-29 NOTE — Behavioral Health (Signed)
[facility]  [facility]  Residential Shift Note     Shift Synopsis:  [name] was pleasant and appropriate with unit staff and other guests.  [name] ate all meals and followed all guidelines.  [name] attended and participated in group.     Mood: [name] was pleasant and appropriate     Affect: Congruent with mood     Phone calls: [name] made no calls this shift     Appetite: [name] ate all meals     Groups:  [name] attended and participated in NA group.     Leonard [name], MHS

## 2022-05-29 NOTE — Group Note (Signed)
Group topic:  EXPERIENTIAL THERAPY/ACTIVITY GROUP    Date of group:  05/29/2022  Start time of group:  1000  End time of group:  1130      Summary of group: Experiential therapy is a therapeutic technique in which expressive tools and activities are used to allow patients to re-enact and re-experience emotional situations in a safe environment. The primary focus is trigger "eustress" or good stress to show that patients are capable of adapting and changing behaviors. It also aids in the development of community and takes into the account the needs of others in order to be successful.      Group Activity: Hot Chocolate and Marshmallows   Affect: Guarded  Mood: Anxious    Observed behavior:  Participation in group: Refuses To Participate (Does not engage in group task physically/verbally despite encouragement)  Interaction with peers: Sometimes (Will respond to direct questions from peers although does not initiate interactions)  Able to recognize and express feelings: Seldom (Withholds, denies, projects feelings 90-100% of the time)  Following instructions: Unable To [employer] Directions  Instruction type: Verbal  Stays on task: 20 MInutes  Solves problems: Minimal Awareness That Difficulties Exist  Copes with frustration and anxiety: No Ability To Cope (Unable to engage in discussion/ task, avoids, quits, walks out of group)  Works cooperatively in a group: N/A      Note: Patient did not participate in the group activity. He identifies physical pain as a limitation for participation.             Anderson Malta, CT  05/29/2022, 11:43

## 2022-05-29 NOTE — Behavioral Health (Signed)
[facility]  [facility]  Residential Shift Note 7-3     Shift Synopsis: [name] was pleasant and appropriate this shift. [name] has been spending time socializing with other guests in the community rooms. [name] followed guidelines and was appropriate with staff.     Mood: [name] was pleasant and appropriate.      Affect: Affect appeared to be consistent with mood.       Phone calls: [name] did not make a phone call this shift.     Appetite: Appetite was good; guest ate well at breakfast and lunch.      Sleep: [name] did not sleep this shift.      Groups: [name] attended and participated in all groups.     Leonard [name], MHS

## 2022-05-29 NOTE — Progress Notes (Signed)
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 Lidopatch TD daily ordered.    Arlean Hopping DO, MSc     05/29/2022     12:18  Assistant Professor- Addiction Medicine  Department of Behavioral Medicine and Psychiatry  Pager [phone]

## 2022-05-29 NOTE — Group Note (Signed)
Group topic:  ACTIVITY GROUP    Date of group:  05/29/2022  Start time of group:  1830  End time of group:  1930                 Summary of group discussion:  NA group meeting  Topic:  How do you handle your anxiety and depression when it comes  Then two grateful's   [name] was jasmine      Leonard [name]  is a 51 y.o. male participating in a activity group.    Patient observations:  [name] attended  and participated NA group.      Patient goals:      [name], MHS  05/29/2022, 20:43

## 2022-05-30 NOTE — Group Note (Signed)
Group topic:  PROCESS THERAPY    Date of group:  05/30/2022  Start time of group:  1300  End time of group:  1400               Summary of group discussion:  in the group today the pts read over the 7th chapter in the basic text. It was on recovery and relapse. The group discussed how to better deal with illness  they discussed what parts of the chapter that they felt was the most important to them and that they related to the most.       Leonard [name]  is a 51 y.o. male participating in a process group.    Affect/Mood:  Appropriate    Thought Process:  Logical    Thought Content:  Within normal limits    Interpersonal:  Discussed issues    Level of participation:  Full    Comments:    Nicholos Johns, CT  05/30/2022, 14:34

## 2022-05-30 NOTE — Group Note (Signed)
Group topic:  ACTIVITY GROUP    Date of group:  05/30/2022  Start time of group:  1830  End time of group:  1930                 Summary of group discussion: NA group meeting       Leonard [name]  is a 51 y.o. male participating in a activity group.    Patient observations: [name] attended and participated in NA group.       Patient goals:      Odelia Gage, MHS  05/30/2022, 20:42

## 2022-05-30 NOTE — Nurses Notes (Signed)
Prn Tylenol given for Pain in Right Hip rated 8/10.

## 2022-05-30 NOTE — Group Note (Signed)
Group topic:  PROCESS THERAPY    Date of group:  05/30/2022  Start time of group:  1000  End time of group:  1130               Summary of group discussion: in the group meeting today the pts were instructed on the disease cycle of addiction, they where shown how they have been repeating the same cycle now of gains and losses since they started using, they were instructed on how to rebuild their lives by daily application of recovery.       Leonard [name]  is a 51 y.o. male participating in a process group.    Affect/Mood:  Appropriate    Thought Process:  Logical    Thought Content:  Within normal limits    Interpersonal:  Discussed issues    Level of participation:  Full    Comments:    Nicholos Johns, CT  05/30/2022, 14:02

## 2022-05-30 NOTE — Behavioral Health (Addendum)
[facility]  [facility]aling  Residential Shift Note     Shift synopsis:   [name] attended all available groups and programming. [name] did not exhibit or verbalize withdrawal symptoms or express any cravings, and appeared pleasant and appropriate with staff and peers.     Mood: Appears content     Affect: Consistent with mood      Appetite: Consumed breakfast and lunch        Phone calls:  Made a call and it went well     Sleep:  none this shift      Groups: Attended all available programming     Leveda Anna, MHS

## 2022-05-30 NOTE — Behavioral Health (Signed)
[facility]  [facility] for [facility] and Healing  Residential Shift Note     Shift Synopsis:  [name] was pleasant and appropriate with unit staff and other guests.  [name] ate all meals and followed all guidelines.  [name] attended and participated in group.     Mood: [name] was pleasant and appropriate     Affect: Congruent with mood     Phone calls: [name] made a call to [facility] and call was good.     Appetite: [name] ate all meals     Groups:  [name] attended and participated in NA group.     [name], MHS

## 2022-05-30 NOTE — Behavioral Health (Signed)
[place], [place]  Case Manager Discharge Planning Session     [facility]     Name: Vontrell Pullman  MRN: [id number]  Date of Birth: [birth year]  Date of Meeting: 05/16/2022  Phone:   Home Phone [phone]   Work Phone [phone]   Mobile [phone]     Case Manager met with guest to discuss and/or work on aftercare and follow-up plans for post discharge.  This includes any phone calls for interviews/ legal matters or other needed steps prior to discharge. Case manager will follow up with patient to determine if patient needs to interview for the facility or if case manager has any additional questions.  Referrals Made/Sent   None     Accepted/Denied Placements    Phone Calls Made with Case Manager  [name] requested his personal phone and credit card to pay a bill. [name] was provided with his phone and card and was able to pay his bill. [name] voiced no other concerns at this time.       Aftercare Appointments   Tentative Discharge Date 06/13/22   Patient will be returning or discharging to: Undetermined    MAT Provider    Therapy    Psychiatry     PCP    Other:        Oswald Hillock, CASE MANAGER, 05/30/2022, 15:09

## 2022-05-30 NOTE — Ancillary Notes (Signed)
[facility] - INDIVIDUAL THERAPY PROGRESS NOTE    NAME: Leonard [name]  DOB: October 09, 1971  DATE: 05/30/2022  MRN: [id number]  SESSION DURATION: 30    CHIEF COMPLAINT: Patient is a 51 y.o. 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     PATIENT'S REPORT OF PROGRESS SINCE LAST SESSION: pt is working on his DC planning and is active in groups, he discusses when there is conflict he retreats away from the situation.    STRESSORS/EVENTS/NEW ISSUES DISCUSSED IN SESSION: pt is looking for long term tx.    OBJECTIVE:      Appearance: groomed   Mood: pleasant   Affect: blunted   Thought process: linear   Thought content: wnl    GOALS/OBJECTIVES ADDRESSED:     Objective from treatment plan: pt will identify types of coping     Was the objective achieved? no     How is the patient progressing towards treatment goals?    Continue or Revise Plan? continue.    THERAPEUTIC INTERVENTIONS USED:    Transtheoretical Model of Change - facilitating the process of change and improving motivation in patients based on their current stage of change by building on knowledge and raising awareness about addiction as a brain disease.  Motivational Interviewing - rolling with resistance, using reflections and summaries to engage patients effectively, and identifying and exploring patient ambivalence; eliciting change-talk in patients  Psychoeducation - using research-based, factual information about addiction/recovery as a brain disease to instill knowledge into patients  Cognitive Behavioral Therapy- aid in problem solving and correlating between the beliefs, thoughts and feelings and the behaviors that follow.   Solution Focused Therapy-focus on setting clear, concise and realistic goals; help patients discover and accomplish their own solutions to problems.   Narrative Therapy- assist patients in identifying their strengths and allowing them to be the expert in their own life.   Relapse  Prevention- The purpose of a relapse prevention plan is to help the patient understand their own personal warning signs. These warning signs are specific to each person and can help the patient identify when their mental health condition may be starting to return so they can get help sooner.     PATIENT'S RESPONSE TO INTERVENTIONS: active      ASAM Criteria for 3.5 Level of Care  ASAM Level of Care:  ASAM Dimension 1(Acute Intoxication and/or Withdrawal Potential): Patient is at minimal risk of withdrawal. He had not drank or used benzo's in over one week prior to admission and had smoked marijuana the day before admission. They have had no illicit UDSs while in treatment, and there is no evidence of ongoing use.  ASAM Dimension 2 (Biomedical Conditions/Complications): Patient has multiple physical health issues includingCongestive Heart Failure, Non Insulin Dependent DM Type 2,HTN, GERD, Cellulitis. He appears older than stated age and has elevated BMI. While their medical diagnoses currently do not interfere with his ability to participate in treatment, it is highly likely to interfere if patient relapses.   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  ASAM Dimension 4(Readiness to Change): Stage of change appears to be contemplation. Patient has been working towards setting up a healthy environment after discharge, and has identified that [employer] discharge could be ahealthy choice. Patient is active in setting up additional treatment for long-term care following discharge. Pt has been putting out applications  and contacting his brother that is a positive support  ASAM Dimension 5(Relapse, Continued Use, or Continued Problem Potential): Risk of relapse is high. Patient is struggling with Chronic  Substance Abuse, Poor Coping Skills, Psychosocial Stressors,Psychiatric and Medical Comorbidities, Unemployment. Additional interventions may be necessary to assist with long-term recovery.   ASAM Dimension 6(Recovery/Living Environment): Risk ismoderate. Patient currently has a unstable living environment with  his brother but he reports having used substances in this context.    DC Plan: pt is filling out long term tx applications.

## 2022-05-30 NOTE — Group Note (Signed)
Group topic:  ACTIVITY GROUP    Date of group:  05/30/2022  Start time of group:  0845  End time of group:  0930                 Summary of group discussion: Morning supportive group began with a round of check-ins, where group members shared their current mood as well as any news or stressors they wanted to discuss. A volunteer read the Just for Today, which focused on finding solitude from those further along in recovery as proof that 12-step programs are effective and recovery is possible.       Leonard [name]  is a 51 y.o. male participating in a activity group.    Patient observations: Patient was on time and participated.     Barnie Mort, MHS  05/30/2022, 10:20

## 2022-05-31 NOTE — Behavioral Health (Signed)
Daily Service Plan Summary  [name] has attended the following clinical services today:  Group Supportive Counseling:           2 hours  Group Professional Therapy:              2 hours       Rontae Inglett, Case Manager 05/31/2022, 14:34

## 2022-05-31 NOTE — Behavioral Health (Signed)
Daily Service Plan Summary  [name] has attended the following clinical services today:  Group Supportive Counseling:           2 hours  Group Professional Therapy:              2 hours       Jacobus Colvin, Case Manager 05/31/2022, 14:34

## 2022-05-31 NOTE — Behavioral Health (Signed)
Daily Service Plan Summary  [name] has attended the following clinical services today:  Group Supportive Counseling:           2 hours  Group Professional Therapy:              2 hours       Kiwana Deblasi, Case Manager 05/31/2022, 14:34

## 2022-05-31 NOTE — Group Note (Signed)
Group topic:  PROCESS THERAPY    Date of group:  05/31/2022  Start time of group:  1000  End time of group:  1130               Summary of group discussion: today in the group setting they were instructed on the developmental stages of ericson and how they have went through these stages and missed doing them correctly. They discussed learning how to heal and grow. The processed how the practices of recovery and help heal old wounds.       Lois Ostrom  is a 51 y.o. male participating in a process group.    Affect/Mood:  Appropriate    Thought Process:  Logical    Thought Content:  Within normal limits    Interpersonal:  Discussed issues    Level of participation:  Full    Comments:    Nicholos Johns, CT  05/31/2022, 14:41

## 2022-05-31 NOTE — Behavioral Health (Signed)
[place] [facility]  [facility]  Residential Shift Note     Shift Synopsis:  [name] was pleasant and appropriate with unit staff and other guests.  [name] ate all meals and followed all guidelines.  [name] attended and participated in social hour.     Mood: [name] was pleasant and appropriate     Affect: Congruent with mood     Phone calls: [name] made no calls this shift     Appetite: [name] ate all meals     Groups:  [name] attended and participated in social hour.  [name] attended and participated in supportive group.      Leonard [name], MHS

## 2022-05-31 NOTE — Behavioral Health (Signed)
Daily Service Plan Summary  [name] has attended the following clinical services today:  Group Supportive Counseling:           2 hours  Group Professional Therapy:              2 hours       Javari Bufkin, Case Manager 05/31/2022, 14:34

## 2022-05-31 NOTE — Behavioral Health (Signed)
[facility]  Center for [facility] and Healing  Residential Shift Note     Shift Synopsis:  [name] was pleasant and appropriate with unit staff and other guests.  [name] ate all meals and followed all guidelines.  [name] attended and participated in group.     Mood: [name] was pleasant and appropriate     Affect: Congruent with mood     Phone calls: [name] made no calls this shift     Appetite: [name] ate all meals     Groups:  [name] attended and participated in group.    Donnella Bi, MHS  05/31/2022, 13:29

## 2022-05-31 NOTE — Behavioral Health (Signed)
Daily Service Plan Summary  [name] has attended the following clinical services today:  Group Supportive Counseling:           2 hours  Group Professional Therapy:              2 hours       Adoria Kawamoto, Case Manager 05/31/2022, 14:34

## 2022-05-31 NOTE — Behavioral Health (Signed)
Daily Service Plan Summary  [name] has attended the following clinical services today:  Group Supportive Counseling: 2 hours  Group Professional Therapy:  2 hours    Service Plan development:  1 hour     Larry Sierras, Case Manager 05/31/2022, 15:18

## 2022-05-31 NOTE — Behavioral Health (Signed)
Daily Service Plan Summary  [name] has attended the following clinical services today:  Group Supportive Counseling:           2 hours  Group Professional Therapy:              2 hours       Ezel Vallone, Case Manager 05/31/2022, 14:34

## 2022-05-31 NOTE — Behavioral Health (Signed)
Daily Service Plan Summary  [name] has attended the following clinical services today:  Group Supportive Counseling:           2 hours  Group Professional Therapy:              2 hours       Tecia Cinnamon, Case Manager 05/31/2022, 14:34

## 2022-05-31 NOTE — Group Note (Signed)
Group topic:  ACTIVITY GROUP    Date of group:  05/31/2022  Start time of group:  1545  End time of group:  1645                 Summary of group discussion: [name] attended and participated in supportive group.  Topic was on forgiveness:  What forgiveness is, What forgiveness isn't.  [name] completed the four phases of forgiveness.  1.  The uncovering phase  2.  The decision phase   3.  The work phase  4. The deepening phase.  Lastly, guest described feelings toward the offender. [name] talked about how their negative feelings have changed over time.      Leonard [name]  is a 51 y.o. male participating in a activity group.    Patient observations:  [name] attended and participated in supportive group.      Patient goals:      [name], MHS  05/31/2022, 16:53

## 2022-05-31 NOTE — Behavioral Health (Signed)
Daily Service Plan Summary  [name] has attended the following clinical services today:  Group Supportive Counseling:           2 hours  Group Professional Therapy:              2 hours       Ellerie Arenz, Case Manager 05/31/2022, 14:34

## 2022-05-31 NOTE — Behavioral Health (Signed)
Daily Service Plan Summary  [name] has attended the following clinical services today:  Group Supportive Counseling: 2 hours  Group Professional Therapy:  2 hours    Individual Professional Therapy: 1 hour     Larry Sierras, Case Manager 05/31/2022, 15:19

## 2022-05-31 NOTE — Nurses Notes (Signed)
Client continues to complain of significant Right hip pain. Rotating PRN Medication throughout the day of 600 mg Ibuprofen and 975 mg Tylenol. Treatment team aware. Client has Ortho Consult visit for August after his discharge from [facility].

## 2022-05-31 NOTE — Group Note (Signed)
Group topic:  PROCESS THERAPY    Date of group:  05/31/2022  Start time of group:  1300  End time of group:  1400               Summary of group discussion:in the group today we discussed what the guest thought of this mornings group on levels of development. The group discussed their past and how they feel lost as adults. The group as well did a depression inventory which concluded with a discussion of how a just for today thought process can help with mood issues.       Leonard [name]  is a 51 y.o. male participating in a process group.    Affect/Mood:  Appropriate    Thought Process:  Logical    Thought Content:  Within normal limits    Interpersonal:  Discussed issues    Level of participation:  Full    Comments:    Nicholos Johns, CT  05/31/2022, 14:59

## 2022-05-31 NOTE — Behavioral Health (Signed)
Daily Service Plan Summary  [name] has attended the following clinical services today:  Group Supportive Counseling: 2 hours  Group Professional Therapy:  2 hours    Individual Professional Therapy: 1 hour     Larry Sierras, Case Manager 05/31/2022, 15:17

## 2022-06-01 NOTE — Behavioral Health (Signed)
[place] [facility]  [facility]  Residential Shift Note     Shift synopsis: [name] attended all scheduled groups.  Socialized and watched TV with peers. He was polite and pleasant with staff and peers.     Mood: Polite and upbeat.     Affect: Congruent with mood.     Phone calls: [name] made no calls this shift.     Appetite: [name] ate dinner.      Groups:  [name] was active in all groups.    Marquist Binstock, MHS

## 2022-06-01 NOTE — Group Note (Signed)
Group topic:  ACTIVITY GROUP    Date of group:  06/01/2022  Start time of group:  1830  End time of group:  1930                 Summary of group discussion:  NA group meeting Topic How is your attitude towards yourself and /or others changing?  Second, what are you grateful for today.      Leonard [name]  is a 51 y.o. male participating in a activity group.    Patient observations:  [name] attended and participated in supportive group.      Patient goals:      [name], MHS  06/01/2022, 20:25

## 2022-06-01 NOTE — Group Note (Signed)
Group topic:  ACTIVITY GROUP    Date of group:  06/01/2022  Start time of group:  0840  End time of group:  0915                 Summary of group discussion:    Just for today discussion on expectations. Not taking on too much and not expecting too much out of others either due to let downs in life. It's alright we are not all perfect.       Leonard [name]  is a 51 y.o. male participating in a activity group.    Patient observations: Leonard [name] was quiet but he did listen and nodded to himself frequently.       Patient goals:      Leonard [name], MHS  06/01/2022, 09:07

## 2022-06-01 NOTE — Behavioral Health (Signed)
[place] [facility]  [facility]  Residential Shift Note     Shift synopsis: [name] attended all scheduled groups.  Socialized and watched TV with peers. They were polite and pleasant with staff and peers.     Mood: Polite and upbeat.     Affect: Consistent with mood.     Phone calls: [name] made no phone calls this shift.     Appetite: [name] ate all meals.      Groups:  [name] was active in all groups.    Tracer Gutridge, MHS  06/01/2022, 13:16

## 2022-06-02 NOTE — Behavioral Health (Signed)
[facility]  Center for [facility] and Healing  Residential Shift Note     Shift synopsis: [name] attended all scheduled groups.  Socialized and watched TV with peers. They were polite and pleasant with staff and peers.     Mood: Polite and upbeat.     Affect: Consistent with mood.     Phone calls: [name] made one positive phone calls this shift.     Appetite: [name] ate all meals.      Groups:  [name] was active in all groups.    Azucena Fallen, MHS  06/02/2022, 13:55

## 2022-06-02 NOTE — Behavioral Health (Signed)
[facility]  Center for [facility] and Healing  Residential Shift Note 3-11     Shift synopsis: [name] has been pleasant and appropriate this shift. [name] has been spending time socializing with other guest in the community rooms. [name] attended and participated in Supportive group and NA group.       Mood: [name] was pleasant and appropriate      Affect: : Affect appeared to be consistent with mood     Phone calls: Good call with a Friend        Appetite: : Appetite was good       Groups: Attended and participated in Supportive group and NA group.    Odelia Gage, MHS

## 2022-06-02 NOTE — Group Note (Signed)
Group topic:  ACTIVITY GROUP    Date of group:  06/02/2022  Start time of group:  1830  End time of group:  1925                 Summary of group discussion: The topic of the NA meeting tonight was "How have you been honest in your recovery so far?" The chairperson was [place] F.      Clide Remmers  is a 51 y.o. male participating in a activity group.    Patient observations: [name] was an active participant.      Patient goals:      [name], MHS  06/02/2022, 20:34

## 2022-06-02 NOTE — Group Note (Signed)
Group topic:  PROCESS THERAPY    Date of group:  06/01/2022  Start time of group:  1000  End time of group:  1115               Summary of group discussion: Group members practiced healthy communications between their peers. Pts also practiced healthy routines such as yoga and various types of exercise.       Leonard [name]  is a 51 y.o. male participating in a process group.    Affect/Mood:  Appropriate    Thought Process:  Logical    Thought Content:  Within normal limits    Interpersonal:  Attentive    Level of participation:  Full    Comments:    Kathee Delton, CT  06/02/2022, 08:05

## 2022-06-02 NOTE — Group Note (Signed)
Group topic:  ACTIVITY GROUP    Date of group:  06/02/2022  Start time of group:  1545  End time of group:  1645                 Summary of group discussion:  [name] attended and participated in supportive group.  Topic was on Boundaries discussions questions.  Read and discussed questions orally 1 thru 7 and the group shared experiences with boundaries for each question.  Secondly, completed Accepts --  Activities, Contributing, Comparisons, Emotions, Pushing away, Thoughts and Sensations exercise.          Leonard [name]  is a 51 y.o. male participating in a activity group.    Patient observations:  [name] attended and participated in supportive group.      Patient goals:      Leonard [name], MHS  06/02/2022, 17:00

## 2022-06-03 ENCOUNTER — Encounter (HOSPITAL_COMMUNITY): Payer: Self-pay

## 2022-06-03 NOTE — Behavioral Health (Signed)
Daily Service Plan Summary  [name] has attended the following clinical services today:  Group Supportive Counseling: 2 hours  Group Professional Therapy:  2 hours      Larry Sierras, Case Manager 06/03/2022, 11:41

## 2022-06-03 NOTE — Behavioral Health (Signed)
Daily Service Plan Summary  [name] has attended the following clinical services today:  Group Supportive Counseling: 2 hours  Group Professional Therapy:  2 hours      Ishmeal Rorie, Case Manager 06/03/2022, 11:41

## 2022-06-03 NOTE — Behavioral Health (Signed)
[place] [facility]  [facility]  Residential Shift Note 7-3     Shift Synopsis: [name] was pleasant and appropriate this shift. [name] has been spending time socializing with other guests in the community rooms. [name] followed guidelines and was appropriate with staff.     Mood: [name] was pleasant and appropriate.      Affect: Affect appeared to be consistent with mood.       Phone calls: [name] did not make a phone call this shift.     Appetite: Appetite was good; guest ate well at breakfast and lunch.      Sleep: [name] did not sleep this shift.      Groups: [name] attended and participated in all groups.     Laurann Mcmorris, MHS

## 2022-06-03 NOTE — Behavioral Health (Signed)
[place] [facility]  [facility]  Residential Shift Note     Shift synopsis: [name] attended all scheduled groups.  Socialized and watched TV with peers. He was polite and pleasant with staff and peers.     Mood: Polite.     Affect: Congruent with mood.     Phone calls: [name] made no calls this shift.     Appetite: [name] ate dinner.      Groups:  [name] was active in all groups.    Galadriel Shroff, MHS

## 2022-06-03 NOTE — Group Note (Signed)
Group topic:  ACTIVITY GROUP    Date of group:  06/03/2022  Start time of group:  1830  End time of group:  1930                 Summary of group discussion: NA      Leonard [name]  is a 51 y.o. male participating in a activity group.    Patient observations: Patient was on time and participated in group        Patient goals:      [name], MHS  06/03/2022, 21:15

## 2022-06-03 NOTE — Group Note (Signed)
Group topic:  ACTIVITY GROUP    Date of group:  06/03/2022  Start time of group:  1545  End time of group:  1620                 Summary of group discussion: The topic of the Men A afternoon supportive group was the Just For Today daily meditation, "Freedom from active addiction". [name] discussed not comparing your insides to others outsides, self-honesty, the gift of desperation, and that every clean day is a miracle.      Mickal Meno  is a 51 y.o. male participating in a activity group.    Patient observations: [name] was an active participant. He was part of the group discussion and was supportive of his peers.      Patient goals:      [name], MHS  06/03/2022, 16:24

## 2022-06-03 NOTE — Behavioral Health (Signed)
Daily Service Plan Summary  [name] has attended the following clinical services today:  Group Supportive Counseling: 2 hours  Group Professional Therapy:  2 hours    Service Plan development:  1 hour    Larry Sierras, Case Manager 06/03/2022, 11:41

## 2022-06-03 NOTE — Behavioral Health (Signed)
[facility] - INDIVIDUAL THERAPY PROGRESS NOTE    NAME: Leonard [name]  DOB: 1971-06-30  DATE: 06/03/2022  MRN: [id number]  SESSION DURATION: 30 minutes    CHIEF COMPLAINT: Patient is a 51 y.o. 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     PATIENT'S REPORT OF PROGRESS SINCE LAST SESSION: Reports that things are going well and that he has been applying for [facility].    STRESSORS/EVENTS/NEW ISSUES DISCUSSED IN SESSION: This Clinical research associate processed the coping skills he has been learning and using since being here and discussed his motivation for treatment with him. He reported being "tired of being tired" and wanting to recover for himself. He also stated that he wants his son who is struggling to be able to see that it is possible to recover. We discussed the radical change of life involved in going from abusing substances to abstaining from substances and this Clinical research associate reinforced the positive changes that will likely occur with ongoing sobriety.    OBJECTIVE:      Appearance: WNL   Mood: content   Affect: congruent to mood and topic   Thought process: logical   Thought content: relevant    GOALS/OBJECTIVES ADDRESSED:     Objective from treatment plan: meeting with individual therapist, coping skills    Was the objective achieved? Yes' ongoing     How is the patient progressing towards treatment goals? Making progress    Continue or Revise Plan? Continue.    THERAPEUTIC INTERVENTIONS USED:    Motivational Interviewing - rolling with resistance, using reflections and summaries to engage patients effectively, and identifying and exploring patient ambivalence; eliciting change-talk in patients  Cognitive Behavioral Therapy- aid in problem solving and correlating between the beliefs, thoughts and feelings and the behaviors that follow.   Solution Focused Therapy-focus on setting clear, concise and realistic goals; help patients discover and accomplish their own  solutions to problems.   Narrative Therapy- assist patients in identifying their strengths and allowing them to be the expert in their own life.     PATIENT'S RESPONSE TO INTERVENTIONS: Receptive      ASAM Criteria for 3.5 Level of Care   ASAM Level of Care:          ASAM Dimension 1 (Acute Intoxication and/or Withdrawal Potential): Patient is at minimal risk of withdrawal. He had not drank or used benzo's in over one week prior to admission and had smoked marijuana the day before admission. They have had no illicit UDSs while in treatment, and there is no evidence of ongoing use.          ASAM Dimension 2 (Biomedical Conditions/Complications): Patient has multiple physical health issues including Congestive Heart Failure, Non Insulin Dependent DM Type 2, HTN, GERD, Cellulitis. He appears older than stated age and has elevated BMI. While their medical diagnoses currently do not interfere with his ability to participate in treatment, it is highly likely to interfere if patient relapses. His physical health appears to be improving with improved sleep, diet and exercise.          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          ASAM Dimension 4 (Readiness to Change): Stage of change appears to be contemplation. Patient has been working towards setting up a healthy environment after discharge, and has identified that [facility] following discharge could be a healthy choice. Patient is active in setting up additional treatment for long-term care following discharge. Pt has been putting out applications and contacting his brother that is a positive support          ASAM Dimension 5 (Relapse, Continued Use, or Continued Problem Potential): Risk of relapse is high. Patient is struggling with Chronic Substance Abuse, Poor Coping Skills, Psychosocial Stressors,  Psychiatric and Medical Comorbidities, Unemployment. Additional interventions may be necessary to assist with long-term recovery.                ASAM Dimension 6 (Recovery/Living Environment): Risk ismoderate. Patient                    currently has a unstable living environment with his brother but he reports having used             substances in this context. He has applied for [facility] in this area but has                  not heard back yet.    Larey Brick, [facility]

## 2022-06-03 NOTE — Care Plan (Signed)
Patient has participated in all programing today. They did attend all supportive counseling and intensive sessions today. Patient has had appropriate interactions with peers, shared in groups and was in a content mood. They have been cooperative with staff and had no problematic behaviors in the milieu. Patient did have individual therapy today. Continue with both groups and individual therapy until discharge.      Please review treatment plan for updated achieved outcomes and evidence for improvement in goals.

## 2022-06-03 NOTE — Group Note (Signed)
Psychoeducational Group Note  Date of group:  06/03/2022  Start time of group:  1000  End time of group:  1130               Summary of group discussion: Patients began group with a brief feelings check-in. Patients were encouraged to identify, discuss, and process the benefits or pros using substances offered them as well as they identify and consider the cons or downsides of using substances. Additionally, patients identified the pros and cons of changing their lifestyle associated with using substances. Group was designed to touch on topics such as fear of change, the difficulties of switching from a lifestyle of substance use to a lifestyle of recovery, and exploring ambivalence associated with change. Patients were given the opportunity to visualize and share how their lives could be different without the presence of substances.       Leonard [name]  s a 51 y.o. male participating in a psychoeducational group.    Affect/Mood:  Appropriate    Thought Process:  Logical    Thought Content:  Within normal limits    Interpersonal:  Discussed issues, Attentive, and Displayed insight    Level of participation:  Full    NEW ISSUES, STRESSORS, EVENTS: Patient shared about the irrationality of his disease and spoke briefly about how denial surfaces in his life.    THERAPEUTIC INTERVENTIONS USED:    Transtheoretical Model of Change - using stages of change to provide intervention and implement strategies for patient based on their readiness levels.    Motivational Interviewing - normalizing, exploring, and resolving ambivalence about recovery  CBT - reframing/restructuring distorted/irrational thought patterns    RESPONSE TO INTERVENTIONS: receptive; engaged.    PROGRESSION TOWARDS GOALS: making progress     STEPS TO TAKE BASED ON PATIENT PROGRESS: Patient will continue to work through resolving his ambivalence towards change.    Leonard [name], [facility] 06/03/2022 11:40

## 2022-06-04 MED ORDER — LIDOCAINE 4 % TOPICAL PATCH
2.0000 | MEDICATED_PATCH | Freq: Every day | CUTANEOUS | Status: DC
Start: 2022-06-04 — End: 2022-06-13
  Administered 2022-06-04 – 2022-06-08 (×5): 2 via TRANSDERMAL
  Administered 2022-06-09: 0 via TRANSDERMAL
  Administered 2022-06-10 – 2022-06-13 (×4): 2 via TRANSDERMAL

## 2022-06-04 NOTE — Behavioral Health (Signed)
[facility]  [facility]  Residential Shift Note     Shift Synopsis:  [name] was pleasant and appropriate with unit staff and other guests.  [name] ate all meals and followed all guidelines.  [name] attended and participated in group.     Mood: [name] was pleasant and appropriate     Affect: Congruent with mood     Phone calls: [name] made no calls this shift     Appetite: [name] ate all meals     Groups:  [name] attended and participated in NA group.     Margerie Fraiser, MHS

## 2022-06-04 NOTE — Behavioral Health (Signed)
[facility]  [facility]  Residential Shift Note      Shift synopsis: [name] was pleasant and appropriate with unit staff and other guests. [name] ate all meals and followed all guidelines. [name] attended all group meetings on time.     Mood: [name] was pleasant and appropriate.     Affect: Congruent with mood.    Phone calls: No phone calls made.      Appetite: [name] ate all meals.      Groups: [name] attended and participated in all groups.     Karsen Fellows, MHS

## 2022-06-04 NOTE — Group Note (Signed)
Group topic:  ACTIVITY GROUP    Date of group:  06/04/2022  Start time of group:  1830  End time of group:  1930                 Summary of group discussion:  NA group meeting with H&I   The topic was on [employer] handout.      Leonard [name]  is a 51 y.o. male participating in a activity group.    Patient observations:  [name] attended and participated in NA group meeting.      Patient goals:      [name], MHS  06/04/2022, 21:44

## 2022-06-04 NOTE — Group Note (Signed)
Group topic:  EXPERIENTIAL THERAPY/ACTIVITY GROUP    Date of group:  06/04/2022  Start time of group:  1000  End time of group:  1120      Summary of group: Experiential therapy is a therapeutic technique in which expressive tools and activities are used to allow patients to re-enact and re-experience emotional situations in a safe environment. The primary focus is trigger "eustress" or good stress to show that patients are capable of adapting and changing behaviors. It also aids in the development of community and takes into the account the needs of others in order to be successful.      Group Activity: [place]  Affect: Broad  Mood: Anxious    Observed behavior:  Participation in group: Participates With Moderate Encouragement (Displays hesitancy to engage although is requiring less prompting/encouragement from others)  Interaction with peers: Usually (Is beginning to initiate appropriate interactions although avoids more sensitive issues)  Able to recognize and express feelings: Usually (Is able to verbalize emotions appropriately with some encouragement and at times will initiate verbalizations)  Following instructions: Carries Out Simple One-Step Directions  Instruction type: Verbal  Stays on task: 45 Minutes  Solves problems: Frequently Requires Assistance  Copes with frustration and anxiety: Frequently Needs Assistance (Requires a lot of prompting and encouragement although is able to stay engaged with this help)  Works cooperatively in a group: Usually (Appears willing to work with over half of the group members and is displaying improvement in compromising skills)      Note: Patient actively participated in the group activity. Patient considered not participating in the activity but his peers encouraged him to join in.           Anderson [place], [place]  06/04/2022, 11:46

## 2022-06-04 NOTE — Group Note (Signed)
Group topic:  PROCESS THERAPY    Date of group:  06/04/2022  Start time of group:  1300  End time of group:  1345               Summary of group discussion:    This Clinical research associate began group with an opening round in which the participants identified their feelings. Participants then read from a list of inspirational addiction/recovery quotes and discussed what they had done in the morning group. Following this, this Clinical research associate continued to educate on meditation as a healthy coping strategy. This Clinical research associate educated on distancing ourselves from our thoughts. This Clinical research associate then led a guided mindful breathing meditation followed by a brief walk outside.        Leonard [name]  is a 51 y.o. male participating in a process group.    Affect/Mood:  Appropriate    Thought Process:  Logical    Thought Content:  Within normal limits    Interpersonal:  Attentive    Level of participation:  Full    Comments:    Kayleen Memos, CLINICAL THERAPIST  06/04/2022, 14:51

## 2022-06-04 NOTE — Group Note (Signed)
Group topic:  ACTIVITY GROUP    Date of group:  06/04/2022  Start time of group:  0845  End time of group:  0930                 Summary of group discussion: [name] this morning participated in supportive group. Guests completed check ins, and then discussed guilt associated with active addictions. Guests discussed the topics of self pity and self forgiveness. Guests then went on a walk.       Leonard [name]  is a 51 y.o. male participating in a activity group.    Patient observations: Leonard [name] was an active participant.         [place], [place]  06/04/2022, 10:48

## 2022-06-05 DIAGNOSIS — R52 Pain, unspecified: Secondary | ICD-10-CM

## 2022-06-05 MED ORDER — DULOXETINE 30 MG CAPSULE,DELAYED RELEASE
30.0000 mg | DELAYED_RELEASE_CAPSULE | Freq: Every day | ORAL | Status: DC
Start: 2022-06-07 — End: 2022-06-13
  Administered 2022-06-07 – 2022-06-13 (×7): 30 mg via ORAL

## 2022-06-05 NOTE — Behavioral Health (Signed)
[facility]  [facility]  Residential Shift Note     Shift Synopsis:  [name] was pleasant and appropriate with unit staff and other guests.  [name] ate all meals and followed all guidelines.  [name] attended and participated in group.  [name] had peer recovery today.     Mood: [name] was pleasant and appropriate     Affect: Congruent with mood     Phone calls: [name] made no calls this shift     Appetite: [name] ate all meals     Groups:  [name] attended and participated in peer recovery group and NA group.      Geniene List, MHS

## 2022-06-05 NOTE — Group Note (Signed)
Group topic:  ACTIVITY GROUP    Date of group:  06/05/2022  Start time of group:  1830  End time of group:  1930                 Summary of group discussion:  NA group meeting topic was What's something you are struggling with right now or What something you struggle with in general.      Leonard [name]  is a 51 y.o. male participating in a activity group.    Patient observations:  [name] attended and participated in NA group meeting.      Patient goals:      [name], MHS  06/05/2022, 21:17

## 2022-06-05 NOTE — Group Note (Signed)
Group topic:  PROCESS THERAPY    Date of group:  06/05/2022  Start time of group:  1300  End time of group:  1345               Summary of group discussion:    This Clinical research associate began group with an opening round in which the participants identified their feelings. A volunteer then read from the book Don't Sweat the Small Stuff" about the importance of welcoming and working through problems rather than trying to fight against them or avoid them. Typical emotional responses that they experience when faced with a problem were identified and discussed. Following this, this Clinical research associate continued to educate on meditation as a healthy coping strategy. This Clinical research associate facilitated a guided meditation that involved counting and focusing on the breath.        Leonard [name]  is a 51 y.o. male participating in a process group.    Affect/Mood:  Appropriate    Thought Process:  Logical    Thought Content:  Somatic    Interpersonal:  Attentive and Provided feedback    Level of participation:  Full    Comments:    Kayleen Memos, CLINICAL THERAPIST  06/05/2022, 16:38

## 2022-06-05 NOTE — Progress Notes (Signed)
[facility]  Residential Treatment Unit Interval Progress Note      Patient approached me in between group today to discuss persistent pain and depressive symptoms. Discussed starting Cymbalta DR 30 mg daily to which he agrees. Orders placed.    Arlean Hopping DO, MSc     06/05/2022     17:28  Assistant Professor- Addiction Medicine  Department of Behavioral Medicine and Psychiatry  Pager [phone]

## 2022-06-05 NOTE — Group Note (Signed)
Group topic:  PROCESS THERAPY    Date of group:  06/05/2022  Start time of group:  1000  End time of group:  1130               Summary of group discussion:    This Clinical research associate led a group in which the topic was recovery. This Clinical research associate showed a video interview between Nuala Alpha and Gretta Cool in which Kathrynn Running details his addiction and its consequences as well as the grace he has found in recovery. He speaks frankly about having hurt people, shares his guilt and regret over his actions and discusses what making "living amends" means to him in a very powerful, emotional testimonial. Discussion focused on the need for support in recovery and how we learn from others' stories.        Dillan Candela  is a 51 y.o. male participating in a process group.    Affect/Mood:  Appropriate    Thought Process:  Logical    Thought Content:  Within normal limits    Interpersonal:  Attentive    Level of participation:  Full    Comments:    Kayleen Memos, CLINICAL THERAPIST  06/05/2022, 16:15

## 2022-06-06 ENCOUNTER — Other Ambulatory Visit: Payer: Self-pay

## 2022-06-06 MED ORDER — DULOXETINE 30 MG CAPSULE,DELAYED RELEASE
30.0000 mg | DELAYED_RELEASE_CAPSULE | Freq: Every day | ORAL | 0 refills | Status: DC
Start: 2022-06-07 — End: 2022-06-12
  Filled 2022-06-06: qty 30, 30d supply, fill #0

## 2022-06-06 NOTE — Nurses Notes (Signed)
Alternating PRN Tylenol 975 mg and Ibuprofen 600 mg given throughout the day for severe Hip and knee pain.

## 2022-06-06 NOTE — Group Note (Signed)
Group topic:  ACTIVITY GROUP    Date of group:  06/06/2022  Start time of group:  1830  End time of group:  1930                 Summary of group discussion: Group NA meeting, Topic: What motivates you and how do you use this in recovery?      Leonard [name]  is a 51 y.o. male participating in a activity group.    Patient observations: [name] attended and participated in NA group.      Patient goals:      Odelia Gage, MHS  06/06/2022, 21:34

## 2022-06-06 NOTE — Behavioral Health (Signed)
[facility]  [facility]  Residential Shift Note 7-3     Shift Synopsis: [name] was pleasant and appropriate this shift. [name] has been spending time socializing with other guests in the community rooms. [name] followed guidelines and was appropriate with staff.     Mood: [name] was pleasant and appropriate.      Affect: Affect appeared to be consistent with mood.       Phone calls: [name] did not make a phone call this shift.     Appetite: Appetite was good; guest ate well at breakfast and lunch.      Sleep: [name] did not sleep this shift.      Groups: [name] attended and participated in all groups.     Rich Paprocki, MHS

## 2022-06-06 NOTE — Group Note (Signed)
Group topic:  ACTIVITY GROUP    Date of group:  06/06/2022  Start time of group:  0845  End time of group:  0930                 Summary of group discussion: [name] participated in JFT/Supportive group discussing learning to trust people despite being disappointed by people throughout their lives.      Leonard [name]  is a 50 y.o. male participating in a activity group.    Patient observations:      Patient goals:      Leveda Anna, MHS  06/06/2022, 09:39

## 2022-06-06 NOTE — Group Note (Signed)
Group topic:  PROCESS THERAPY    Date of group:  06/06/2022  Start time of group:  1300  End time of group:  1345               Summary of group discussion:    This Clinical research associate began group with an opening round in which the participants identified their feelings and something that they were grateful for. A volunteer then read from the book Don't Sweat the Small Stuff" about the importance of listening to others' beliefs and opinions even if we don't agree with them, to seek first to understand then to be understood. Following this, this Clinical research associate continued to educate on meditation as a healthy coping strategy. This Clinical research associate facilitated a guided meditation that involved visualizing a clear blue sky and sunshine as a pathway to progressive muscle relaxation.        Leonard [name]  is a 51 y.o. male participating in a process group.    Affect/Mood:  Appropriate    Thought Process:  Logical    Thought Content:  Within normal limits    Interpersonal:  Attentive    Level of participation:  Full    Comments:    Kayleen Memos, CLINICAL THERAPIST  06/06/2022, 16:50

## 2022-06-06 NOTE — Group Note (Signed)
Group topic:  PROCESS THERAPY    Date of group:  06/06/2022  Start time of group:  1000  End time of group:  1130               Summary of group discussion:    This Clinical research associate led a group in which the topic was recovery. This Clinical research associate showed the second part of a video interview between Nuala Alpha and [place] in which Kathrynn Running details his addiction and its consequences as well as the grace he has found in recovery. He speaks frankly about having hurt people, shares his guilt and regret over his actions and discusses hitting rock bottom. He also discusses how he found that true love is selflessly helping others and that being a man means that you are responsible for others.  Discussion focused on the need for support in recovery and how we learn from others' stories.        Hooper Petteway  is a 50 y.o. male participating in a process group.    Affect/Mood:  Appropriate    Thought Process:  Logical    Thought Content:  Within normal limits    Interpersonal:  Attentive    Level of participation:  Full    Comments:    Kayleen Memos, CLINICAL THERAPIST  06/06/2022, 15:07

## 2022-06-06 NOTE — Behavioral Health (Signed)
[facility]  [facility]  Residential Shift Note     Shift Synopsis:  [name] was pleasant and appropriate with unit staff and other guests.  [name] ate all meals and followed all guidelines.  [name] attended and participated in group.     Mood: [name] was pleasant and appropriate     Affect: Congruent with mood     Phone calls: [name] made no calls this shift     Appetite: [name] ate all meals     Groups:  [name] attended and participated in NA group.     Everette Dimauro, MHS

## 2022-06-07 NOTE — Behavioral Health (Signed)
[facility]  [facility]  Residential Shift Note     Shift Synopsis:  [name] was pleasant and appropriate with unit staff and other guests.  [name] ate all meals and followed all guidelines.  [name] attended and participated in social hour.     Mood: [name] was pleasant and appropriate     Affect: Congruent with mood     Phone calls: [name] made no calls this shift     Appetite: [name] ate all meals     Groups:  [name] attended and participated in social hour.  [name] attended and participated in supportive group.      Naythan Douthit, MHS

## 2022-06-07 NOTE — Group Note (Signed)
Psychoeducational Group Note  Date of group:  06/07/2022  Start time of group:  1015  End time of group:  1145               Summary of group discussion: Patients briefly processed how personal overdoses and/or overdoses of people whom they know have affected them emotionally and psychologically. Then, patients engaged in psychoeducation regarding opioid, stimulant, and alcohol overdose. Information covered included: overview of opioid, alcohol, and stimulant overdose symptoms and signs, naloxone as a rescue medicine for opioid overdose, proper naloxone administration training to reverse an opioid overdose, risk factors for opioid overdose, laws protecting individuals acting in good faith during an opioid reversal, and resources available to individuals who are at potential for overdosing. Seven primary risk factors for overdose and prevention skills for each were listed on the board and they were each explained at length. In their closing round, each patient was instructed to identify one risk/prevention that stood out to them and to explain why. Patients were provided checklists that include each specified item throughout the group and they were required to fill out the checklist and sign their names in order to receive Narcan nasal spray upon their referrals out of the facility. Finally, patients were given two educational brochures for their personal references.      Jaquise Faux  s a 51 y.o. male participating in a psychoeducational group.    Affect/Mood:  Appropriate     Thought Process:  Logical     Thought Content:  Within normal limits     Interpersonal:  Discussed issues and Attentive     Level of participation:  Full     NEW ISSUES, STRESSORS, EVENTS: Patient was attentive and engaged during the psychoeducational portion of group regarding overdose. Patient was attentive and engaged during the overdose identification and naloxone instructional video. Patient did not disclose any personal experience with  overdose. Patient did elect to receive Narcan before being discharged from treatment.     THERAPEUTIC INTERVENTIONS USED:     Psychoeducation - Information about opioid, alcohol, and stimulant overdoses, naloxone, proper administration of naloxone, risk factors associated with an increase in overdose, and additional resources to help individuals seek further assistance with information about opioid overdose.  Trauma Informed Care - briefly exploring and processing trauma associated with overdoses/death     RESPONSE TO INTERVENTIONS: receptive; engaged.     PROGRESSION TOWARDS GOALS: [name] will participate in psychoeducation relating to harm reduction, and will view the naloxone informational video prior to discharge as indicated by treatment team.     STEPS TO TAKE BASED ON PATIENT PROGRESS: continue to apply education and skills gained in group.    Eusebio Me, [facility] 06/07/2022 13:31

## 2022-06-07 NOTE — Group Note (Signed)
Group topic:  ACTIVITY GROUP    Date of group:  06/07/2022  Start time of group:  1545  End time of group:  1645                 Summary of group discussion:  [name] attended and participated in supportive group.  Today's topic was on how to practice self-compassion.  [name] read and discussed healthy habits of self-compassion:  Have a fair attitude toward yourself, Accept yourself for who you are, Take care of yourself, Accept that struggle is normal, and Practice mindful awareness.  Overall, guest discussed self-compassion is showing kindness to yourself.      Nicki Gracy  is a 51 y.o. male participating in a activity group.    Patient observations:  [name] attended and participated in supportive group.      Patient goals:      [name], MHS  06/07/2022, 16:52

## 2022-06-08 NOTE — Behavioral Health (Signed)
[facility]  [facility]  Residential Shift Note     Shift Synopsis:  [name] was pleasant and appropriate with unit staff and other guests.  [name] ate all meals and followed all guidelines.  [name] attended and participated in group.     Mood: [name] was pleasant and appropriate     Affect: Congruent with mood     Phone calls: [name] made no calls this shift     Appetite: [name] ate all meals     Groups:  [name] attended and participated in NA group.     Darsh Vandevoort, MHS

## 2022-06-08 NOTE — Behavioral Health (Signed)
[facility]  [facility] and [facility]  Residential Shift Note     Shift synopsis: [name] attended all scheduled groups.  Socialized and watched TV with peers. They were polite and pleasant with staff and peers.     Mood: Polite and upbeat.     Affect: Consistent with mood.     Phone calls: [name] made no phone calls this shift.     Appetite: [name] ate all meals.      Groups:  [name] was active in all groups.    Azucena Fallen, MHS  06/08/2022, 15:15

## 2022-06-08 NOTE — Group Note (Signed)
Group topic:  ACTIVITY GROUP    Date of group:  06/08/2022  Start time of group:  0905  End time of group:  0925                 Summary of group discussion:    JFT 06/08/22 The shape of our thoughts page 227 Addiction shaped our thoughts in all ways, encouraged guests to do things they always wanted to do. Find new hobbies, and actually do them.     Azucena Fallen, MHS      Leonard [name]  is a 51 y.o. male participating in a activity group.    Patient observations: Leonard [name] stated that he is in constant pain and that he feels he could get more out of this experience if he actually wasn't in pain all the time.       Patient goals:      Azucena Fallen, MHS  06/08/2022, 14:59

## 2022-06-08 NOTE — Group Note (Signed)
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  Second topic was grateful's      Leonard [name]  is a 51 y.o. male participating in a activity group.    Patient observations:  [name] attended and participated in NA group.      Patient goals:      [name], MHS  06/08/2022, 20:43

## 2022-06-09 NOTE — Behavioral Health (Signed)
[facility]  [facility] and Healing  Residential Shift Note 3-11     Shift synopsis: [name] has been pleasant and appropriate this shift. [name] has been spending time socializing with other guest in the community rooms. [name] attended and participated in Supportive group and NA group.      Mood: [name] was pleasant and appropriate      Affect: : Affect appeared to be consistent with mood     Phone calls: Good call with brother        Appetite: : Appetite was good       Groups: Attended and participated in Supportive group and NA group.       Odelia Gage, MHS

## 2022-06-09 NOTE — Group Note (Signed)
Group topic:  ACTIVITY GROUP    Date of group:  06/09/2022  Start time of group:  0845  End time of group:  0945                 Summary of group discussion:  /  /Just for today   Material things that dissatisfied [place] in our lives. True joy can't be bought and it is a struggle to keep. However for today we are seeking for our higher joy within.       Sargent Mankey  is a 51 y.o. male participating in a activity group.    Patient observations: [name] feels good. She states.       Patient goals:      Azucena Fallen, MHS  06/09/2022, 14:31

## 2022-06-09 NOTE — Group Note (Signed)
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       Eduar Kumpf  is a 51 y.o. male participating in a activity group.     Patient observations: [name] attended and participated in NA Group.       Patient goals:      Odelia Gage, MHS  06/09/2022, 20:38

## 2022-06-09 NOTE — Behavioral Health (Signed)
[facility]  Center for [facility] and Healing  Residential Shift Note     Shift Synopsis: [name] was pleasant and appropriate this shift. [name] has been spending time socializing with other guests in the community rooms. [name] followed guidelines and was appropriate with staff.     Mood: [name] was pleasant and appropriate.      Affect: Affect appeared to be consistent with mood.       Phone calls: Tonny did not make a phone call this shift.      Appetite: Appetite was good; guest ate well at breakfast and lunch.      Sleep: [name] did not sleep this shift.      Groups: [name] attended and participated in all groups.        Merleen Milliner, MHS  06/09/2022, 13:42

## 2022-06-09 NOTE — Group Note (Signed)
Group topic:  ACTIVITY GROUP    Date of group:  06/09/2022  Start time of group:  1545  End time of group:  1645                 Summary of group discussion: [name] Read and Discussed the Just For Today which was "The Joy Within". After discussion, [name] talked about the Leaves on the Stream exercise and then did a 10-minute Guided meditation. Group Discussed their thoughts on the meditation.       Leonard [name]  is a 51 y.o. male participating in a activity group.    Patient observations: [name] attended and participated in Supportive group.       Patient goals:      Odelia Gage, MHS  06/09/2022, 17:10

## 2022-06-10 ENCOUNTER — Encounter (HOSPITAL_COMMUNITY): Payer: Self-pay

## 2022-06-10 NOTE — Group Note (Signed)
Group topic:  PROCESS THERAPY    Date of group:  06/10/2022  Start time of group:  1000  End time of group:  1115               Summary of group discussion:    This Clinical research associate began group with an opening round in which the participants identified how they were feeling and something that they were grateful for. Following this, this Clinical research associate facilitated a reading from the book "Don't Sweat the Small Stuff". It was about acknowledging all aspects of yourself good and bad, and accepting negative feelings as well as positive ones. This Clinical research associate then shared about four common vices "power, pleasure, money and fame". The participants each narrowed down the list to find their own vice. The main focus of the group was about core values. The group was given a list of about 40 different values and were asked to identify their top five. For each one, the participants identified three to five questions they could ask themselves to make sure that they are living up to those values. The ways in which the qualities we admire in others are often different from those we aspire to ourselves and how our lives would be if they were more aligned were discussed.            Leonard [name]  is a 51 y.o. male participating in a process group.    Affect/Mood:  Appropriate    Thought Process:  Logical    Thought Content:  Within normal limits    Interpersonal:  Attentive and Provided feedback    Level of participation:  Full    Comments:    Kayleen Memos, CLINICAL THERAPIST  06/10/2022, 16:22

## 2022-06-10 NOTE — Behavioral Health (Signed)
[place] [facility]  [facility]  Residential Shift Note 7-3     Shift Synopsis: [name] was pleasant and appropriate this shift. [name] has been spending time socializing with other guests in the community rooms. [name] followed guidelines and was appropriate with staff.     Mood: [name] was pleasant and appropriate.      Affect: Affect appeared to be consistent with mood.       Phone calls: [name] did not make a phone call this shift.     Appetite: Appetite was good; guest ate well at breakfast and lunch.      Sleep: [name] did not sleep this shift.      Groups: [name] attended and participated in all groups.     Orchid Glassberg, MHS

## 2022-06-10 NOTE — Behavioral Health (Signed)
Daily Service Plan Summary  [name] has attended the following clinical services today:  Group Supportive Counseling:           2 hours  Group Professional Therapy:              2 hours       Jkai Arwood, Case Manager 06/10/2022, 10:42

## 2022-06-10 NOTE — Group Note (Signed)
Group topic:  ACTIVITY GROUP    Date of group:  06/10/2022  Start time of group:  1830  End time of group:  1925                 Summary of group discussion: The topic's of the NA meeting were "When is the last time you made yourself proud?" And "What would you tell 51 yr old you?" The chairperson was Leonard C.       [name]  is a 51 y.o. male participating in a activity group.    Patient observations: Leonard [name] was an active participant.      Patient goals:      Leonard [name], MHS  06/10/2022, 20:55

## 2022-06-10 NOTE — Behavioral Health (Signed)
Daily Service Plan Summary  [name] has attended the following clinical services today:  Group Supportive Counseling: 2 hours  Group Professional Therapy:  2 hours    Individual Professional Therapy: 1 hour   Service Plan development:  1 hour     Larry Sierras, Case Manager 06/10/2022, 11:13

## 2022-06-10 NOTE — Behavioral Health (Signed)
Daily Service Plan Summary  [name] has attended the following clinical services today:  Group Supportive Counseling:           2 hours  Group Professional Therapy:              2 hours       Kashia Brossard, Case Manager 06/10/2022, 10:42

## 2022-06-10 NOTE — Behavioral Health (Signed)
Daily Service Plan Summary  [name] has attended the following clinical services today:  Group Supportive Counseling:           2 hours  Group Professional Therapy:              2 hours       Devany Aja, Case Manager 06/10/2022, 10:42

## 2022-06-10 NOTE — Behavioral Health (Signed)
[facility] [facility]  [facility]  Residential Shift Note     Shift synopsis: [name] attended all scheduled groups.  Socialized and watched TV with peers. He was polite and pleasant with staff and peers.     Mood: Polite.     Affect: Congruent with mood.     Phone calls: [name] made no calls this shift.     Appetite: [name] ate dinner.      Groups:  [name] was active in all groups.    Seona Clemenson, MHS

## 2022-06-10 NOTE — Behavioral Health (Signed)
Daily Service Plan Summary  [name] has attended the following clinical services today:  Group Supportive Counseling:           2 hours  Group Professional Therapy:              2 hours       Zeta Bucy, Case Manager 06/10/2022, 10:42

## 2022-06-10 NOTE — Behavioral Health (Signed)
Daily Service Plan Summary  [name] has attended the following clinical services today:  Group Supportive Counseling:           2 hours  Group Professional Therapy:              2 hours       Leonard [name], Case Manager 06/10/2022, 10:42

## 2022-06-10 NOTE — Group Note (Signed)
Group topic:  ACTIVITY GROUP    Date of group:  06/10/2022  Start time of group:  1545  End time of group:  1615                 Summary of group discussion: The topic of the Men A afternoon supportive group was the Just For Today daily meditation, "The gratitude list". Guests had an open discussion on the benefits of doing a gratitude list, different ways to do one, gratitude being an action word and showing gratitude, and the importance of changing to a more positive attitude.      Leonard [name]  is a 51 y.o. male participating in a activity group.    Patient observations: Leonard [name] was an active participant. He was part of the group discussion and was supportive of his peers.      Patient goals:      Leonard [name], MHS  06/10/2022, 16:22

## 2022-06-10 NOTE — Care Plan (Signed)
Problem: Adult Behavioral Health Plan of Care  Goal: Plan of Care Review  Outcome: Ongoing (see interventions/notes)  Goal: Patient-Specific Goal (Individualization)  Outcome: Ongoing (see interventions/notes)  Goal: Strengths and Vulnerabilities  Outcome: Ongoing (see interventions/notes)  Goal: Adheres to Safety Considerations for Self and Others  Outcome: Ongoing (see interventions/notes)  Goal: Absence of New-Onset Illness or Injury  Outcome: Ongoing (see interventions/notes)  Goal: Optimized Coping Skills in Response to Life Stressors  Outcome: Ongoing (see interventions/notes)  Goal: Develops/Participates in Therapeutic Alliance to Support Successful Transition  Outcome: Ongoing (see interventions/notes)  Goal: Rounds/Family Conference  Outcome: Ongoing (see interventions/notes)

## 2022-06-10 NOTE — Behavioral Health (Signed)
Daily Service Plan Summary  [name] has attended the following clinical services today:  Group Supportive Counseling:           2 hours  Group Professional Therapy:              2 hours       Faigy Stretch, Case Manager 06/10/2022, 10:42

## 2022-06-11 NOTE — Group Note (Signed)
Group topic:  ACTIVITY GROUP    Date of group:  06/11/2022  Start time of group:  0845  End time of group:  0930                 Summary of group discussion: Morning supportive group began with a round of check-ins where group members shared their current mood as well as any news or stressors. A volunteer read the Just for Today, which focused on taking responsibility in recovery. Group members discussed their experience in doing step work and the importance of accountability.       Leonard [name]  is a 51 y.o. male participating in a activity group.    Patient observations: Patient was on time and participated.     Barnie Mort, MHS  06/11/2022, 09:43

## 2022-06-11 NOTE — Behavioral Health (Signed)
[facility]  [facility]  Residential Shift Note 7-3     Shift Synopsis: [name] was pleasant and appropriate this shift. [name] has been spending time socializing with other guests in the community rooms. [name] followed guidelines and was appropriate with staff.     Mood: [name] was pleasant and appropriate.      Affect: Affect appeared to be consistent with mood.       Phone calls: [name] did not make a phone call this shift.     Appetite: Appetite was good; guest ate well at breakfast and lunch.      Sleep: [name] did not sleep this shift.      Groups: [name] attended and participated in all groups.     Everlean Bucher, MHS

## 2022-06-11 NOTE — Group Note (Signed)
Group topic:  EXPERIENTIAL THERAPY/ACTIVITY GROUP    Date of group:  06/11/2022  Start time of group:  1000  End time of group:  1135      Summary of group: Experiential therapy is a therapeutic technique in which expressive tools and activities are used to allow patients to re-enact and re-experience emotional situations in a safe environment. The primary focus is trigger "eustress" or good stress to show that patients are capable of adapting and changing behaviors. It also aids in the development of community and takes into the account the needs of others in order to be successful.    Group Activity: [facility] [employer]   Affect: Guarded  Mood: Anxious    Observed behavior:  Participation in group: Participates With Maximum Encouragement (Requires the focus of the group/therapist to take minimal risks 80-100% of the time, then only partially engages)  Interaction with peers: Sometimes (Will respond to direct questions from peers although does not initiate interactions)  Able to recognize and express feelings: Usually (Is able to verbalize emotions appropriately with some encouragement and at times will initiate verbalizations)  Following instructions: Unable To Carry Out Directions  Instruction type: Verbal  Stays on task: 20 MInutes  Solves problems: Minimal Awareness That Difficulties Exist  Copes with frustration and anxiety: No Ability To Cope (Unable to engage in discussion/ task, avoids, quits, walks out of group)  Works cooperatively in a group: Seldom (Will not work the group 90-100% of the time, unwilling to compromise)      Note: Patient did not participate in the group activity despite encouragement.           Anderson [place], [place]  06/11/2022, 12:25

## 2022-06-11 NOTE — Behavioral Health (Signed)
[place], [place]  Case Manager Discharge Planning Session     [facility]     Name: Leonard [name]  MRN: [id number]  Date of Birth: 1971-06-06  Date of Meeting: 05/16/2022  Phone:   Home Phone [phone]   Work Phone [phone]   Mobile [phone]     Case Manager met with guest to discuss and/or work on aftercare and follow-up plans for post discharge.  This includes any phone calls for interviews/ legal matters or other needed steps prior to discharge. Case manager will follow up with patient to determine if patient needs to interview for the facility or if case manager has any additional questions.  Referrals Made/Sent   [facility]     Accepted/Denied Placements  [facility] is full     Phone Calls Made with Case Manager  Spoke with guest about his upcoming discharge and his plans for follow up. [name] stated he was going to go live with his brother and requested [facility] for follow up as well as a PCP at [place] [facility] if possible. [name] voiced no other concerns at that time.       Aftercare Appointments   Tentative Discharge Date 06/13/22   Patient will be returning or discharging to: With brother    MAT Provider N/A   Therapy [facility]    Psychiatry [place] [facility]     PCP [facility]    Other:        Oswald Hillock, CASE MANAGER, 06/11/2022, 15:27

## 2022-06-11 NOTE — Group Note (Signed)
Group topic:  PROCESS THERAPY    Date of group:  06/11/2022  Start time of group:  1300  End time of group:  1400               Summary of group discussion: today in the group setting we had a discussion and video presentation on what to expect and what to do once you leave rehab. The group discussed wome of their plans and how to better change. They discussed the time concept or just for today and discussed having gradititude to help them stay focused in recovery      Leonard [name]  is a 51 y.o. male participating in a process group.    Affect/Mood:  Appropriate    Thought Process:  Logical    Thought Content:  Within normal limits    Interpersonal:  Discussed issues    Level of participation:  Full    Comments:    Nicholos Johns, [place]  06/11/2022, 14:53

## 2022-06-11 NOTE — Group Note (Signed)
Group topic:  ACTIVITY GROUP    Date of group:  06/11/2022  Start time of group:  1830  End time of group:  1930                 Summary of group discussion:  NA group meeting H&I   [name] attended and participated in NA group.      Leonard [name]  is a 51 y.o. male participating in a activity group.    Patient observations: [name] attended and participated in NA group.       Patient goals:      [name], MHS  06/11/2022, 20:46

## 2022-06-11 NOTE — Behavioral Health (Signed)
[facility]  [facility] and Healing  Residential Shift Note     Shift Synopsis:  [name] was pleasant and appropriate with unit staff and other guests.  [name] ate all meals and followed all guidelines.  [name] attended and participated in group.     Mood: [name] was pleasant and appropriate     Affect: Congruent with mood     Phone calls: [name] made a call to [facility] and call was good.     Appetite: [name] ate all meals     Groups:  [name] attended and participated in NA group.     [name], MHS

## 2022-06-12 MED ORDER — LIDOCAINE 4 % TOPICAL PATCH
2.0000 | MEDICATED_PATCH | Freq: Every day | CUTANEOUS | 2 refills | Status: DC
Start: 2022-06-12 — End: 2023-02-18

## 2022-06-12 MED ORDER — TRAZODONE 100 MG TABLET
100.0000 mg | ORAL_TABLET | Freq: Every evening | ORAL | 1 refills | Status: DC
Start: 2022-06-12 — End: 2022-06-26

## 2022-06-12 MED ORDER — ENTRESTO 49 MG-51 MG TABLET
1.0000 | ORAL_TABLET | Freq: Two times a day (BID) | ORAL | 1 refills | Status: DC
Start: 2022-06-12 — End: 2022-09-25

## 2022-06-12 MED ORDER — MELATONIN 3 MG TABLET
6.0000 mg | ORAL_TABLET | Freq: Every evening | ORAL | 2 refills | Status: DC
Start: 2022-06-12 — End: 2022-10-22

## 2022-06-12 MED ORDER — SPIRONOLACTONE 25 MG TABLET
25.0000 mg | ORAL_TABLET | Freq: Every morning | ORAL | 1 refills | Status: DC
Start: 2022-06-12 — End: 2022-06-18

## 2022-06-12 MED ORDER — SENNOSIDES 8.6 MG-DOCUSATE SODIUM 50 MG TABLET
1.0000 | ORAL_TABLET | Freq: Two times a day (BID) | ORAL | 2 refills | Status: DC | PRN
Start: 2022-06-12 — End: 2022-10-22

## 2022-06-12 MED ORDER — NICOTINE 21 MG/24 HR DAILY TRANSDERMAL PATCH
21.0000 mg | MEDICATED_PATCH | Freq: Every day | TRANSDERMAL | 2 refills | Status: DC
Start: 2022-06-12 — End: 2022-10-22

## 2022-06-12 MED ORDER — KLOXXADO 8 MG/ACTUATION NASAL SPRAY
1.0000 | NASAL | 11 refills | Status: DC | PRN
Start: 2022-06-12 — End: 2024-07-12

## 2022-06-12 MED ORDER — PANTOPRAZOLE 40 MG TABLET,DELAYED RELEASE
40.0000 mg | DELAYED_RELEASE_TABLET | Freq: Every day | ORAL | 2 refills | Status: DC
Start: 2022-06-12 — End: 2022-10-22

## 2022-06-12 MED ORDER — METOPROLOL SUCCINATE ER 25 MG TABLET,EXTENDED RELEASE 24 HR
25.0000 mg | ORAL_TABLET | Freq: Every day | ORAL | 1 refills | Status: DC
Start: 2022-06-12 — End: 2022-06-18

## 2022-06-12 MED ORDER — NICOTINE (POLACRILEX) 4 MG GUM
4.0000 mg | CHEWING_GUM | BUCCAL | 5 refills | Status: DC | PRN
Start: 2022-06-12 — End: 2022-10-22

## 2022-06-12 MED ORDER — DULOXETINE 30 MG CAPSULE,DELAYED RELEASE
30.0000 mg | DELAYED_RELEASE_CAPSULE | Freq: Every day | ORAL | 1 refills | Status: DC
Start: 2022-06-12 — End: 2022-06-18

## 2022-06-12 MED ORDER — FOLIC ACID 1 MG TABLET
1.0000 mg | ORAL_TABLET | Freq: Every day | ORAL | 1 refills | Status: DC
Start: 2022-06-12 — End: 2022-10-22

## 2022-06-12 MED ORDER — FUROSEMIDE 40 MG TABLET
40.0000 mg | ORAL_TABLET | Freq: Every day | ORAL | 1 refills | Status: DC
Start: 2022-06-12 — End: 2022-06-18

## 2022-06-12 MED ORDER — METFORMIN 500 MG TABLET
500.0000 mg | ORAL_TABLET | Freq: Every morning | ORAL | 1 refills | Status: DC
Start: 2022-06-12 — End: 2022-06-18

## 2022-06-12 NOTE — Discharge Summary (Signed)
[facility]   RESIDENTIAL TREATMENT UNIT (3.5 LEVEL OF CARE)  DISCHARGE SUMMARY    PATIENT NAME:  Leonard [name], Leonard [name]     MRN:  [id number]  DOB:  December 17, 1970  ENCOUNTER DATE:  06/13/2022  INPATIENT ADMISSION DATE:  05/16/2022     DISCHARGE DATE:  06/13/2022    ATTENDING PHYSICIAN: Silverio Lay, DO  SERVICE: RESIDENTIAL TREATMENT  PRIMARY CARE PHYSICIAN: No Pcp         PRIMARY DISCHARGE DIAGNOSIS:   Active Hospital Problems    Diagnosis Date Noted    Principal Problem: Severe alcohol use disorder ([facility]) [F10.20] 05/16/2022      Resolved Hospital Problems   No resolved problems to display.       There are no active non-hospital problems to display for this patient.       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        Details   DULoxetine 30 mg Capsule, Delayed Release(E.C.)  Commonly known as: CYMBALTA DR   30 mg, Oral, DAILY  Qty: 30 Capsule  Refills: 1     Kloxxado 8 mg/actuation Spray, Non-Aerosol  Generic drug: naloxone   8 mg, nasal (alternating), EVERY 2 MIN PRN, for actual or suspected opioid overdose. Alternate nostrils if needing more than one dose. Call 911 if used.  Qty: 4 Each  Refills: 11     lidocaine 4 % Adhesive Patch, Medicated   2 Patches, Transdermal, DAILY, to the  left hip  Qty: 60 Patch  Refills: 2     melatonin 3 mg Tablet   6 mg, Oral, NIGHTLY  Qty: 60 Tablet  Refills: 2     nicotine 21 mg/24 hr Patch 24 hr  Commonly known as: NICODERM CQ   21 mg, Transdermal, DAILY  Qty: 28 Patch  Refills: 2     nicotine polacrilex 4 mg Gum  Commonly known as: NICORETTE   4 mg, Oral, EVERY 2 HOURS PRN  Qty: 100 Each  Refills: 5     traZODone 100 mg Tablet  Commonly known as: DESYREL   100 mg, Oral, NIGHTLY  Qty: 30 Tablet  Refills: 1            CONTINUE these medications which have CHANGED during your visit.        Details   sennosides-docusate sodium 8.6-50 mg Tablet  Commonly known as: SENOKOT-S  What changed: when to take this   1 Tablet, Oral, 2 TIMES DAILY PRN  Qty: 60 Tablet  Refills: 2            CONTINUE these medications - NO CHANGES were made during your visit.        Details   Entresto 49-51 mg Tablet  Generic drug: sacubitriL-valsartan   1 Tablet, Oral, 2 TIMES DAILY  Qty: 60 Tablet  Refills: 1     folic acid 1 mg Tablet  Commonly known as: FOLVITE   1  mg, Oral, DAILY  Qty: 30 Tablet  Refills: 1     furosemide 40 mg Tablet  Commonly known as: LASIX   40 mg, Oral, DAILY  Qty: 30 Tablet  Refills: 1     metFORMIN 500 mg Tablet  Commonly known as: GLUCOPHAGE   500 mg, Oral, EVERY MORNING WITH BREAKFAST  Qty: 30 Tablet  Refills: 1     metoprolol succinate 25 mg Tablet Sustained Release 24 hr  Commonly known as: TOPROL-XL   25 mg, Oral, DAILY  Qty: 30 Tablet  Refills: 1     pantoprazole 40 mg Tablet, Delayed Release (E.C.)  Commonly known as: PROTONIX   40 mg, Oral, DAILY  Qty: 30 Tablet  Refills: 2     spironolactone 25 mg Tablet  Commonly known as: ALDACTONE   25 mg, Oral, EVERY MORNING WITH BREAKFAST  Qty: 30 Tablet  Refills: 1            STOP taking these medications.      acetaminophen 325 mg Tablet  Commonly known as: TYLENOL     cephalexin 500 mg Capsule  Commonly known as: KEFLEX     famotidine 20 mg Tablet  Commonly known as: PEPCID     Ibuprofen 200 mg Tablet  Commonly  known as: MOTRIN              Discharge med list refreshed?  YES     ALLERGIES:  No Known Allergies        PROCEDURE(S):   Bedside Procedures:  No orders of the defined types were placed in this encounter.      REASON FOR ADMISSION AND TREATMENT COURSE:   This is a 51 y.o. 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 They [facility] wanting assistance with treatment of their substance use disorder.       Please see admission history and physical note excerpt (below in italics) on 05/16/22 by Dr. Jimmye Norman for additional HPI and psychiatric history:  "Mivaan Corbitt is a 51 y.o. 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      DEPRESSION: Yes Poor Sleep, Anhedonia, Guilt, Poor Energy, Poor Concentration, Decreased Appetite, previous but not current SI. No HI with/without plan.     MANIA: Pt denies periods of  time with decreased need for sleep, characterized by pressured speech, elevated mood, impulsivity, grandiosity, racing thoughts, and increased goal-directed activity. "Only when drunk"     ANXIETY: Yes social anxiety. No agoraphobia. Few panic attacks (characterized by SOB, CP, numbness/tingling, palpitations, diaphoresis, sense of impending doom) triggered during times he is anxious, but no specific triggers. No obsessive thoughts  accompanied by compulsive behavior.     PSYCHOSIS: No A/VH, delusions, paranoia. No tactile hallucinations.     TRAUMA: No History of abuse (sexual, physical, emotional). No flashbacks, Yes nightmares.     SUBSTANCE ABUSE  Alcohol: First use age 26, pint to a fifth daily, last use last Wednesday 05/08/2022.  Benzo's: First illicit use age 43. Prescribed xanax, last use last Wednesday 05/08/2022.  Tobacco: First use age 51. Half pack per day. Actively using.  THC: First use age 63, 2-3 times weekly, 1 joint, last use last Wednesday 05/08/2022.  Opiates: Pain Pills: First use age 6, not regular use, 2-3 pills per use, snort and oral, last use a few months ago. Heroin: Used once age 27. Illicit Buprenorphine/Methadone: Denies  Stimulants: Cocaine: First use age 7, every other day, 1g, snort and smoke, last use a couple of months age. Meth: Used a few times 5 years ago, a line or two, snorted. Illicit Rx Amphetamines: Denies   Other: LSD: First use age 59, every so often, couple hits PCP: First use age 43, every so often, couple hits  Psilocybin: First use age 8, last use couple weeks ago, microdose, every other day. Kratom: Denies  Longest sober period? 5 years, while in prison  AA/NA meetings? Yes  Ever prescribed Antabuse/Campral/Naltrexone/Buprenorphine? Denies, "Prescribed Ritalin and Xanax:"     Past Psychiatric History:    Current Outpatient: No   Past Outpatient: No  Inpatient: [facility], age 79  Medication Trials: Denies  Suicide Attempts: No     Social  History:  Residence: Patient lives in [place] with brother in a house.   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 Last BP, weight, and height are listed below. Lab results if performed for lipid panel, liver function, and hemoglobin A1C are listed below.     After arriving at the [facility], the patient was restarted on their home medications as appropriate.    Patient was compliant and attended individual and group therapies during his time at the facility. A psychosocial assessment was performed during this admission by the Unit therapist if patient did not decline. During patient's admission, patient had learned multiple skills in order to deal with their substance use disorder and current stressors.     Patient seen and examined by myself on the discharge date above. Patient verbalized understanding of all medication changes and discharge instructions. On day of discharge, the patient denied active suicidal ideation, homicidal ideation, and auditory or visual hallucinations. Patient felt safe to go home and was instructed to call or return to ED if safety concerns arose. Patient was then discharged home in stable medical and mental health condition. Follow-up arranged for the patient.    Patient was offered counseling and treatment for tobacco use and received a prescription for nicotine patches and gum at the time of discharge. Patient was educated on the signs and symptoms of opioid  overdose and was agreeable to being discharged with a prescription for Naloxone.      METABOLIC SCREENING/LAB TESTING:  BP Readings from Last 1 Encounters:   06/13/22 119/70     Wt Readings from Last 1 Encounters:   06/10/22 107 kg (235 lb 0.2 oz)     Ht Readings from Last 1 Encounters:   05/16/22 1.872 m (6' 1.7")     Body mass index is 30.42 kg/m.  No results found for: HA1C  No results found for: GLUCOSENF    No results found for: CHOLESTEROL, HDLCHOL, LDLCHOL, LDLCHOLDIR, TRIG         CONDITION ON DISCHARGE:  A. Ambulation: Full ambulation  B. Self-care Ability: Complete  C. Cognitive Status Alert and Oriented x 3  D. Code status at discharge:              LINES/DRAINS/WOUNDS AT DISCHARGE:   Patient Lines/Drains/Airways Status       Active Line / Dialysis Catheter / Dialysis Graft / Drain / Airway / Wound       None                    DISCHARGE DISPOSITION:  Home discharge        DISCHARGE INSTRUCTIONS:       REFER TO ORTHOPAEDIC [place] LOCATIONS   Referral Type: Physician Referral-Office Visits   Number of Visits Requested: 1       Silverio Lay  DO, MSc   Assistant Professor- Addiction Medicine  Medical Director- [facility] Center for [facility] and [facility]  [address]  [place], [place] [address]  Work (Main):  [phone]  Work (Fax):  [phone]        Referring providers can utilize [email or web address] to access their referred [facility] patient's information or call [phone] to contact this provider.

## 2022-06-12 NOTE — Behavioral Health (Signed)
[facility]  Residential Shift Note     Shift Synopsis:  [name] was pleasant and appropriate with unit staff and other guests.  [name] ate all meals and followed all guidelines.  [name] attended and participated in group.  [name] had peer recovery today.     Mood: [name] was pleasant and appropriate     Affect: Congruent with mood     Phone calls: [name] made no calls this shift     Appetite: [name] ate all meals     Groups:  [name] attended and participated in peer recovery group and NA group.      Leonard [name], MHS

## 2022-06-12 NOTE — Group Note (Signed)
Group topic:  ACTIVITY GROUP    Date of group:  06/12/2022  Start time of group:  1830  End time of group:  1930                 Summary of group discussion:  NA group meeting today's topic was obstacles in recovery.      Myrick Mcnairy  is a 51 y.o. male participating in a activity group.    Patient observations: [name] attended and participated in NA group.       Patient goals:      [name], MHS  06/12/2022, 20:45

## 2022-06-12 NOTE — Behavioral Health (Signed)
Med[facility]  [facility]  Residential Shift Note 7-3     Shift Synopsis: [name] was pleasant and appropriate this shift. [name] has been spending time socializing with other guests in the community rooms. [name] followed guidelines and was appropriate with staff.     Mood: [name] was pleasant and appropriate.      Affect: Affect appeared to be consistent with mood.       Phone calls: [name] did not make a phone call this shift.     Appetite: Appetite was good; guest ate well at breakfast and lunch.      Sleep: [name] did not sleep this shift.      Groups: [name] attended and participated in all groups.     Michial Disney, MHS

## 2022-06-12 NOTE — Discharge Instructions (Addendum)
Future Appointments   Date Time Provider Department Center   06/18/2022  2:30 PM Hermenia Fiscal, MD [facility] [place] T   06/25/2022 10:30 AM Hilda Lias, MD [facility]   06/26/2022 10:30 AM Lorenda Hatchet, MD [facility]   06/26/2022  3:00 PM Mariam Dollar, CT [facility]     Local AA/Na list provided for patients area    National suicide hotline [phone]    [facility] resources hotline [phone]

## 2022-06-13 DIAGNOSIS — F329 Major depressive disorder, single episode, unspecified: Secondary | ICD-10-CM

## 2022-06-13 NOTE — Nurses Notes (Signed)
Motrin given PRN bilateral knee pain.

## 2022-06-13 NOTE — Group Note (Signed)
Group topic:  ACTIVITY GROUP    Date of group:  06/13/2022  Start time of group:  0845  End time of group:  0915                 Summary of group discussion: JFT group with the topic "Regular prayer and meditation". The JFT states "Just For Today: I will make a commitment to include regular prayer and meditation in my new pattern of living". Gratitude was also discussed and a gratitude circle was completing following discussion.       Leonard [name]  is a 51 y.o. male participating in a activity group.    Patient observations:    [name] did not attend supportive group and did not have a bed pass. [name] is discharging today.       Merleen Milliner, MHS  06/13/2022, 09:25

## 2022-06-17 NOTE — Behavioral Health (Signed)
Daily Service Plan Summary  [name] has attended the following clinical services today:  Group Supportive Counseling:           2 hours  Group Professional Therapy:              2 hours       Jushua Waltman, Case Manager 06/17/2022, 08:56

## 2022-06-17 NOTE — Behavioral Health (Signed)
Daily Service Plan Summary  [name] has attended the following clinical services today:  Group Supportive Counseling:           2 hours  Group Professional Therapy:              2 hours       Leonard [name], Case Manager 06/17/2022, 08:56

## 2022-06-18 ENCOUNTER — Other Ambulatory Visit: Payer: Self-pay

## 2022-06-18 ENCOUNTER — Ambulatory Visit: Payer: MEDICAID | Attending: Family Medicine

## 2022-06-18 ENCOUNTER — Encounter (HOSPITAL_BASED_OUTPATIENT_CLINIC_OR_DEPARTMENT_OTHER): Payer: Self-pay

## 2022-06-18 VITALS — BP 114/76 | HR 103 | Temp 97.3°F | Resp 18 | Ht 70.32 in | Wt 237.4 lb

## 2022-06-18 DIAGNOSIS — Z114 Encounter for screening for human immunodeficiency virus [HIV]: Secondary | ICD-10-CM | POA: Insufficient documentation

## 2022-06-18 DIAGNOSIS — Z1211 Encounter for screening for malignant neoplasm of colon: Secondary | ICD-10-CM | POA: Insufficient documentation

## 2022-06-18 DIAGNOSIS — Z0000 Encounter for general adult medical examination without abnormal findings: Secondary | ICD-10-CM | POA: Insufficient documentation

## 2022-06-18 DIAGNOSIS — E119 Type 2 diabetes mellitus without complications: Secondary | ICD-10-CM | POA: Insufficient documentation

## 2022-06-18 DIAGNOSIS — F419 Anxiety disorder, unspecified: Secondary | ICD-10-CM | POA: Insufficient documentation

## 2022-06-18 DIAGNOSIS — I509 Heart failure, unspecified: Secondary | ICD-10-CM | POA: Insufficient documentation

## 2022-06-18 DIAGNOSIS — F32A Depression, unspecified: Secondary | ICD-10-CM | POA: Insufficient documentation

## 2022-06-18 MED ORDER — METOPROLOL SUCCINATE ER 25 MG TABLET,EXTENDED RELEASE 24 HR
25.0000 mg | ORAL_TABLET | Freq: Every day | ORAL | 1 refills | Status: DC
Start: 2022-06-18 — End: 2022-08-26
  Filled 2022-06-18: qty 30, 30d supply, fill #0
  Filled 2022-08-01: qty 30, 30d supply, fill #1

## 2022-06-18 MED ORDER — FUROSEMIDE 40 MG TABLET
40.0000 mg | ORAL_TABLET | Freq: Every day | ORAL | 1 refills | Status: DC
Start: 2022-06-18 — End: 2022-08-07
  Filled 2022-06-18: qty 30, 30d supply, fill #0
  Filled 2022-08-01: qty 30, 30d supply, fill #1

## 2022-06-18 MED ORDER — DULOXETINE 30 MG CAPSULE,DELAYED RELEASE
30.0000 mg | DELAYED_RELEASE_CAPSULE | Freq: Every day | ORAL | 1 refills | Status: DC
Start: 2022-06-18 — End: 2022-06-26
  Filled 2022-06-18: qty 30, 30d supply, fill #0

## 2022-06-18 MED ORDER — SPIRONOLACTONE 25 MG TABLET
25.0000 mg | ORAL_TABLET | Freq: Every morning | ORAL | 1 refills | Status: DC
Start: 2022-06-18 — End: 2022-08-26
  Filled 2022-06-18: qty 30, 30d supply, fill #0
  Filled 2022-08-01: qty 30, 30d supply, fill #1

## 2022-06-18 MED ORDER — METFORMIN 500 MG TABLET
500.0000 mg | ORAL_TABLET | Freq: Every morning | ORAL | 1 refills | Status: DC
Start: 2022-06-18 — End: 2022-08-26
  Filled 2022-06-18: qty 30, 30d supply, fill #0

## 2022-06-18 NOTE — Progress Notes (Signed)
[facility]   Department of Family Medicine   Clinic Note    Leonard [name]  MRN: [id number]  DOB: [birth year]  Date of Service: 06/18/2022    CHIEF COMPLAINT  Chief Complaint   Patient presents with    Establish Care     Was discharged from [facility] 8/10       SUBJECTIVE  Leonard [name] is a 51 y.o. male who presents to clinic for establishing care.    From [place], used to work in [employer]. Used to do cocaine, has not for a couple years.     Recently discharged from [facility] for alcohol abuse. Stated he is still remaining sober. States he is adjusting to be out in public, but getting day by day.     Hx of Diabetes - states diagnosed with pre-diabetes at one point in his life, takes 500 mg metformin intermittently. Unsure if he really had diabetes    CHF - states when he was doing a lot of cocaine he had heart trouble, unsure of what this was or what hospital diagnosed him. Currently on Lasix 40 mg, Entrestro, Metoprolol, Aldactone. Tolerating well.     Smoking - started smoking roughly 30 years ago. Smokes at most 1/2 pack per day. Likely has 15 pack year history. Not interested in quitting at this time.     Colonoscopy - never has completed. No FH of colon carcinoma. No blood in stool. Normal bowel movements.     Anxiety and depression - Cymbalta 30 mg. Trazodone 100 mg nightly. Helps him sleep. No SI.     Alchohol abuse - currently sober. Wishing to remain sober. Feels as if this is time. Going to continue to follow with psych and therapist. Going to attend meetings.     Otherwise, no complaints.         Preventative healthcare (Male):    Colonoscopy (50-75 average risk or age 88 or 26 years prior to 1st degree relative diagnosis)    CT chest (>30 pack year smoking history; yearly starting at age 34-80, current smoker or quit within 15 years)    Prostate Cancer Screening (50-69 after risk versus benefit discussion)    AAA Screening (65-75, >100 cigarettes)     Depression Screening (yearly PHQ-2) PHQ Questionnaire  Little interest or pleasure in doing things.: Not at all  Feeling down, depressed, or hopeless: Not at all  PHQ 2 Total: 0           Lipids No results found for: CHOLESTEROL, HDLCHOL, LDLCHOL, LDLCHOLDIR, TRIG  The ASCVD Risk score (Arnett DK, et al., 2019) failed to calculate for the following reasons:    Cannot find a previous HDL lab    Cannot find a previous total cholesterol lab   Hemoglobin A1c (every 3 years if BMI >25 and additional risk factor - ADA) or fasting blood glucoe No results found for: HA1C  No results found for: GLUCOSEFAST        HIV screening (once)    HCV screening (once)    Tetanus (every 10 years or every 5 if wound) PCV 13:  PCV 23:  Immunization History   Administered Date(s) Administered    Tetanus,Diptheria,Pertussis(BOOSTRIX) 11/04/2017        Flu Shot (yearly)    Pneumonia Vaccine (>65yo or CAD/DM/COPD/Liver Disease/Smoking every 5 years or once after 46)    Zoster vaccination (> 64 yo)    COVID Vaccination  Medications:   Outpatient Medications Marked as Taking for the 06/18/22 encounter (Office Visit) with Titus Dubin, MD   Medication Sig    DULoxetine (CYMBALTA DR) 30 mg Oral Capsule, Delayed Release(E.C.) Take 1 Capsule (30 mg total) by mouth Once a day    folic acid (FOLVITE) 1 mg Oral Tablet Take 1 Tablet (1 mg total) by mouth Once a day    furosemide (LASIX) 40 mg Oral Tablet Take 1 Tablet (40 mg total) by mouth Once a day    lidocaine 4 % Adhesive Patch, Medicated Place 2 Patches on the skin Once a day to the left hip    melatonin 3 mg Oral Tablet Take 2 Tablets (6 mg total) by mouth Every night    metFORMIN (GLUCOPHAGE) 500 mg Oral Tablet Take 1 Tablet (500 mg total) by mouth Every morning with breakfast    metoprolol succinate (TOPROL-XL) 25 mg Oral Tablet Sustained Release 24 hr Take 1 Tablet (25 mg total) by mouth Once a day    nicotine (NICODERM CQ) 21 mg/24 hr Transdermal Patch 24 hr Place 1 Patch (21 mg  total) on the skin Once a day Indications: stop smoking    nicotine polacrilex (NICORETTE) 4 mg Buccal Gum Take 1 Each (4 mg total) by mouth Every 2 hours as needed for Nicotine withdrawal Indications: stop smoking    pantoprazole (PROTONIX) 40 mg Oral Tablet, Delayed Release (E.C.) Take 1 Tablet (40 mg total) by mouth Once a day    sennosides-docusate sodium (SENOKOT-S) 8.6-50 mg Oral Tablet Take 1 Tablet by mouth Twice per day as needed (Constipation)    spironolactone (ALDACTONE) 25 mg Oral Tablet Take 1 Tablet (25 mg total) by mouth Every morning with breakfast    traZODone (DESYREL) 100 mg Oral Tablet Take 1 Tablet (100 mg total) by mouth Every night       Allergies:  No Known Allergies    Past Medical History:  Past Medical History:   Diagnosis Date    Anxiety     Depression     Diabetes mellitus type II, non insulin dependent (CMS HCC)     GERD (gastroesophageal reflux disease)     Ritalin use disorder, severe (CMS HCC)     Severe alcohol use disorder (CMS HCC)     Severe benzodiazepine use disorder (CMS HCC)          Past Surgical History:  Past Surgical History:   Procedure Laterality Date    INGUINAL HERNIA REPAIR  1990     Family History:  Family History   Problem Relation Name Age of Onset    Alzheimer's/Dementia Mother      Congestive Heart Failure Father      No Known Problems Brother      Breast Cancer Maternal Grandmother         Social History:  Social History     Tobacco Use    Smoking status: Every Day     Packs/day: 0.50     Types: Cigarettes    Smokeless tobacco: Never   Substance Use Topics    Alcohol use: Not Currently     Comment: Drinking from 1 pint to 1/5 liquor per day    Drug use: Not Currently     Frequency: 3.0 times per week     Types: Cocaine     Comment: 3 times per week. not used for couple months 05/16/2022     Social History     Social History Narrative  Not on file       OBJECTIVE  BP 114/76 (Site: Left, Patient Position: Sitting, Cuff Size: Adult)   Pulse (!) 103   Temp  36.3 C (97.3 F) (Thermal Scan)   Resp 18   Ht 1.786 m (5' 10.32")   Wt 108 kg (237 lb 7 oz)   SpO2 96%   BMI 33.76 kg/m       Constitutional/General: No distress. Pleasant.  HEENT:  external ear without deformity  Lungs: Symmetric chest rise BIL. CTABL. No crackle in bases.   Cardiovascular: Well perfused. No murmer heard. Regular rhythm.  Abdomen/Gastrointestinal: bowel sounds present  Extremities: No edema. Normal strength in upper and lower.   Skin: warm and dry, no rash  Neurologic: gait is normal.   Psychiatric: normal affect and behavior    ASSESSMENT/PLAN  Vasiliy Mccarry a 51 y.o. male presenting for annual exam.     (Z00.00) Annual physical exam  (primary encounter diagnosis)  Plan: CBC/DIFF, COMPREHENSIVE METABOLIC PANEL, NON-FASTING, LIPID PANEL, HGA1C (HEMOGLOBIN A1CWITH EST AVG GLUCOSE)  - History reviewed  - Medications updated  - Lab work ordered, patient to obtain (Unsure of previous diagnoses and locations).   - Will continue to follow.          (F41.9) Anxiety   (F32.A) Depression, unspecified depression type  - Continue Cymbalta 30 mg  - Continue trazodone 100 mg.   - Continue to follow with psych.     (E11.9) Diabetes mellitus type II, non insulin dependent (CMS HCC)  Plan:   - Continue metformin 500 mg.   - Patient does not want to take this. If hemoglobin A1C comes back WNL, will discuss need for this medication as patient is unsure if he ever truly had an diagnostic A1C and if only pre-diabetes would prefer to abstain from medication at this point.   - Patient is not currently on a statin.   - Patient is currently on an ARB.       (Z11.4) Screening for HIV (human immunodeficiency virus)  Plan: HIV1/HIV2 SCREEN, COMBINED ANTIGEN AND ANTIBODY  - Ordered blood screen.     (I50.9) Congestive heart failure, unspecified HF chronicity, unspecified heart failure type (CMS HCC)  Plan: TRANSTHORACIC ECHOCARDIOGRAM - ADULT  - Continue Lasix 40 mg, Entresto 49-51, Metoprolol XL 25 mg, Spironalactone  25 mg.    - BP tolerating this dose.  - Will obtained echo to assess current heart function  - Likely 2/2 cocaine use, patient states he was using "a lot" when he had heart trouble.   - BMP ordered to assess kidney function and K+.   - Patient will likely benefit from SGLT2 in future.     (Z12.11) Colon cancer screening  Plan: FECAL DNA TESTING (AMB)  - Deferred colonoscopy.   - Plan for Cologaurd.   - Patient agreeable for colonoscopy following this.       Follow up in 3-6 months, sooner if abnormal labs.     Hermenia Fiscal, MD  PGY-2 Dept. Family Medicine  06/18/2022, 20:08    This document was at least partially generated using a voice recognition system and transcription. All documents are proofed as best as possible, but it may have misspelled words, incorrect words, or syntax and grammatical errors because of the imperfect nature of the system.        I saw and examined the patient. I reviewed the resident's note.  I agree with the findings and plan of care as documented in the  resident's note. Any exceptions/additions are edited/noted.    Berenice Bouton, MD 06/21/2022, 17:22

## 2022-06-25 ENCOUNTER — Encounter (HOSPITAL_BASED_OUTPATIENT_CLINIC_OR_DEPARTMENT_OTHER): Payer: Self-pay | Admitting: ORTHOPEDIC, SPORTS MEDICINE

## 2022-06-25 ENCOUNTER — Ambulatory Visit (HOSPITAL_BASED_OUTPATIENT_CLINIC_OR_DEPARTMENT_OTHER): Payer: MEDICAID | Admitting: Radiology

## 2022-06-25 ENCOUNTER — Ambulatory Visit: Payer: MEDICAID | Attending: ORTHOPEDIC, SPORTS MEDICINE | Admitting: ORTHOPEDIC, SPORTS MEDICINE

## 2022-06-25 ENCOUNTER — Other Ambulatory Visit: Payer: Self-pay

## 2022-06-25 ENCOUNTER — Other Ambulatory Visit (HOSPITAL_BASED_OUTPATIENT_CLINIC_OR_DEPARTMENT_OTHER): Payer: MEDICAID | Admitting: Radiology

## 2022-06-25 ENCOUNTER — Telehealth (HOSPITAL_COMMUNITY): Payer: Self-pay

## 2022-06-25 ENCOUNTER — Inpatient Hospital Stay (HOSPITAL_BASED_OUTPATIENT_CLINIC_OR_DEPARTMENT_OTHER)
Admission: RE | Admit: 2022-06-25 | Discharge: 2022-06-25 | Disposition: A | Discharge status: Home / Self Care | Payer: MEDICAID | Source: Ambulatory Visit | Admitting: Radiology

## 2022-06-25 VITALS — Temp 97.0°F | Ht 71.3 in | Wt 233.5 lb

## 2022-06-25 DIAGNOSIS — M25561 Pain in right knee: Secondary | ICD-10-CM | POA: Insufficient documentation

## 2022-06-25 DIAGNOSIS — M1612 Unilateral primary osteoarthritis, left hip: Secondary | ICD-10-CM

## 2022-06-25 DIAGNOSIS — M25552 Pain in left hip: Secondary | ICD-10-CM | POA: Insufficient documentation

## 2022-06-25 DIAGNOSIS — G8929 Other chronic pain: Secondary | ICD-10-CM

## 2022-06-25 DIAGNOSIS — M25562 Pain in left knee: Secondary | ICD-10-CM | POA: Insufficient documentation

## 2022-06-25 DIAGNOSIS — M1711 Unilateral primary osteoarthritis, right knee: Secondary | ICD-10-CM | POA: Insufficient documentation

## 2022-06-25 MED ORDER — MELOXICAM 15 MG TABLET
15.0000 mg | ORAL_TABLET | Freq: Every day | ORAL | 5 refills | Status: DC
Start: 2022-06-25 — End: 2022-08-26

## 2022-06-25 NOTE — Procedures (Signed)
SPORTS MEDICINE, [facility]  [address]  [place] [address]  Operated by [facility]  Procedure Note    Name: Leonard [name] MRN:  [id number]   Date: 06/25/2022 Age: 51 y.o.       AMB UTC ORTHO [place] NEEDLE GUIDED  Performed by: Hilda Lias, MD  Authorized by: Hilda Lias, MD     Time Out:     Immediately before the procedure, a time out was called:  Yes    Patient verified:  Yes    Procedure Verified:  Yes    Site Verified:  Yes  Large Joint Arthrocentesis: L hip joint  Details: 22 G needle, ultrasound-guided    The risks and benefits of the injection were explained to the patient and the decision was made to proceed with the injection.  Anatomic landmarks are identified with ultrasound.  The skin was prepped with chlorhexidine and the injection site was anesthetized with 2cc 1% lidocaine.  Then under direct ultrasound guidance, the injection was carried out using 2 cc of 1% lidocaine, 2 cc of 0.5% marcaine, and 12 mg betamethasone.  This was injected into the left hip.  The skin was cleaned with alcohol and a sterile bandage was placed over the injection site. The patient tolerated the injection well.  Consent was given by the patient.         Hilda Lias, MD

## 2022-06-25 NOTE — Progress Notes (Addendum)
Name: Leonard [name]  MEDICAL RECORD [id number]  DATE OF BIRTH: [birth year]  DATE OF SERVICE: 06/25/2022       SUBJECTIVE:  Leonard [name] is a 51 y.o. year old who presents to clinic today for evaluation of Hip Pain (Left) and Knee Pain (Bilateral)    He has been having left hip pain and bilateral knee pain for the past few months. He seems to think it has been gradually getting worse. He feels clicking in left hip as well as bilateral knees. The pain in his hip is worse than the knee pain. Hip pain is mostly a aching pain occurring in the groin area. The knee pain is along the medial joint line. It is worse with activity. He also has stiffness when waking up in the morning. He has been taking Tylenol and Ibuprofen without much relief. Marland Kitchen    PAST MEDICAL HISTORY  Past Medical History:   Diagnosis Date    Anxiety     Depression     Diabetes mellitus type II, non insulin dependent (CMS HCC)     GERD (gastroesophageal reflux disease)     Ritalin use disorder, severe (CMS HCC)     Severe alcohol use disorder (CMS HCC)     Severe benzodiazepine use disorder (CMS HCC)       PAST SURGICAL HISTORY    Past Surgical History:   Procedure Laterality Date    Inguinal hernia repair  1990     MEDICATIONS  Current Outpatient Medications   Medication Sig    DULoxetine (CYMBALTA DR) 30 mg Oral Capsule, Delayed Release(E.C.) Take 1 Capsule (30 mg total) by mouth Once a day    folic acid (FOLVITE) 1 mg Oral Tablet Take 1 Tablet (1 mg total) by mouth Once a day    furosemide (LASIX) 40 mg Oral Tablet Take 1 Tablet (40 mg total) by mouth Once a day    lidocaine 4 % Adhesive Patch, Medicated Place 2 Patches on the skin Once a day to the left hip    melatonin 3 mg Oral Tablet Take 2 Tablets (6 mg total) by mouth Every night    meloxicam (MOBIC) 15 mg Oral Tablet Take 1 Tablet (15 mg total) by mouth Once a day    metFORMIN (GLUCOPHAGE) 500 mg Oral Tablet Take 1 Tablet (500 mg total) by mouth Every morning with breakfast    metoprolol succinate  (TOPROL-XL) 25 mg Oral Tablet Sustained Release 24 hr Take 1 Tablet (25 mg total) by mouth Once a day    naloxone (KLOXXADO) 8 mg/actuation Nasal Spray, Non-Aerosol 0.1 mL (8 mg total) by nasal (alternating) route Every 2 minutes as needed for actual or suspected opioid overdose. Alternate nostrils if needing more than one dose. Call 911 if used.    nicotine (NICODERM CQ) 21 mg/24 hr Transdermal Patch 24 hr Place 1 Patch (21 mg total) on the skin Once a day Indications: stop smoking    nicotine polacrilex (NICORETTE) 4 mg Buccal Gum Take 1 Each (4 mg total) by mouth Every 2 hours as needed for Nicotine withdrawal Indications: stop smoking    pantoprazole (PROTONIX) 40 mg Oral Tablet, Delayed Release (E.C.) Take 1 Tablet (40 mg total) by mouth Once a day    sacubitriL-valsartan (ENTRESTO) 49-51 mg Oral Tablet Take 1 Tablet by mouth Twice daily    sennosides-docusate sodium (SENOKOT-S) 8.6-50 mg Oral Tablet Take 1 Tablet by mouth Twice per day as needed (Constipation)    spironolactone (ALDACTONE) 25 mg Oral Tablet  Take 1 Tablet (25 mg total) by mouth Every morning with breakfast    traZODone (DESYREL) 100 mg Oral Tablet Take 1 Tablet (100 mg total) by mouth Every night     ALLERGIES  No Known Allergies  SOCIAL HISTORY  Social History     Tobacco Use    Smoking status: Every Day     Packs/day: 0.50     Types: Cigarettes    Smokeless tobacco: Never   Substance Use Topics    Alcohol use: Not Currently     Comment: Drinking from 1 pint to 1/5 liquor per day    Drug use: Not Currently     Frequency: 3.0 times per week     Types: Cocaine     Comment: 3 times per week. not used for couple months 05/16/2022     FAMILY HISTORY  Family Medical History:       Problem Relation (Age of Onset)    Alzheimer's/Dementia Mother    Breast Cancer Maternal Grandmother    Congestive Heart Failure Father    No Known Problems Brother            REVIEW OF SYSTEMS  As above, otherwise all other systems are essentially negative.    PHYSICAL  EXAM  Temp 36.1 C (97 F) (Thermal Scan)   Ht 1.811 m (5' 11.3")   Wt 106 kg (233 lb 7.5 oz)   BMI 32.29 kg/m        In no acute distress with appropriate mood and affect.  He is alert and oriented times three.  HEENT: NC/AT Pupils are equal and round.  PULM: respirations are unlabored.  CV: Pulses palpable and equal distally.  GI: Abdomen is nondistended.  DERM: Skin is warm and dry to touch.  Bilateral knees: Pain and crepitus on extension of bilateral knees along the medial joint line. No joint instability. No TTP  Left Hip: Pain and decreased ROM with flexion past 90 degrees and internal rotation. Full ROM and no pain with extension and external rotation. TTP in groin and anterior hip.    RADIOGRAPHY:   XR of bilateral Knees obtained today  show right knee tricompartmental OA with significant medial joint space narrowing.  :  left knee has no significant degenerative changes and normal alignment.   XR of L hip obtained today read by US showing joint space narrowing and osteophytes concerning for moderate to severe OA on the left and mild degenerative changes on the right.      ASSESSMENT: Leonard [name] is a 51 y.o male presenting with left hip pain and bilateral knee pain concerning for OA.    ICD-10-CM    1. Chronic pain of both knees  M25.561 XR Knee Bilateral - Joint Center 3 views each    M25.562 AMB CON/REF PT ORTHO SHOULDER/SPORTS    G89.29       2. Chronic left hip pain  M25.552 Left Hip Joint Center Series x-ray    G89.29 AMB CON/REF PT ORTHO SHOULDER/SPORTS     AMB UTC ORTHO [place] NEEDLE GUIDED     UTC ORTHO [place] NEEDLE GUIDED      3. Arthritis of left hip  M16.12 AMB UTC ORTHO [place] NEEDLE GUIDED     UTC ORTHO [place] NEEDLE GUIDED        PLAN:  At today's visit, we discussed treatment options.   CSI performed today on left hip for OA  PT on for OA of bilateral knees  Mobic 15 mg  daily   Follow up in 6 weeks     If the patient has any questions or concerns prior to next visit, he can contact our  office.        Aundria Mems, MD 06/25/2022, 12:31       I saw and examined the patient.  I reviewed the resident's note.  I agree with the findings and plan of care as documented in the resident's note.  Any exceptions/additions are edited/noted.  I have personally reviewed the radiology images and agree with the interpretation above.  Partial improvement of symptoms after hip injeciton.  May consider knee injeciton at next visit.    Hilda Lias, MD

## 2022-06-26 ENCOUNTER — Other Ambulatory Visit (INDEPENDENT_AMBULATORY_CARE_PROVIDER_SITE_OTHER): Payer: MEDICAID | Admitting: Rheumatology

## 2022-06-26 ENCOUNTER — Inpatient Hospital Stay
Admission: RE | Admit: 2022-06-26 | Discharge: 2022-06-26 | Disposition: A | Discharge status: Home / Self Care | Payer: MEDICAID | Source: Ambulatory Visit | Attending: Family Medicine | Admitting: Family Medicine

## 2022-06-26 ENCOUNTER — Ambulatory Visit (INDEPENDENT_AMBULATORY_CARE_PROVIDER_SITE_OTHER): Payer: MEDICAID | Admitting: Student in an Organized Health Care Education/Training Program

## 2022-06-26 ENCOUNTER — Ambulatory Visit (HOSPITAL_COMMUNITY): Payer: Self-pay

## 2022-06-26 ENCOUNTER — Other Ambulatory Visit: Payer: Self-pay

## 2022-06-26 VITALS — BP 127/80 | HR 107 | Ht 71.0 in | Wt 235.9 lb

## 2022-06-26 DIAGNOSIS — I517 Cardiomegaly: Secondary | ICD-10-CM

## 2022-06-26 DIAGNOSIS — I509 Heart failure, unspecified: Secondary | ICD-10-CM | POA: Insufficient documentation

## 2022-06-26 DIAGNOSIS — F32A Depression, unspecified: Secondary | ICD-10-CM

## 2022-06-26 DIAGNOSIS — Z79899 Other long term (current) drug therapy: Secondary | ICD-10-CM

## 2022-06-26 DIAGNOSIS — Z0000 Encounter for general adult medical examination without abnormal findings: Secondary | ICD-10-CM

## 2022-06-26 DIAGNOSIS — Z114 Encounter for screening for human immunodeficiency virus [HIV]: Secondary | ICD-10-CM

## 2022-06-26 DIAGNOSIS — Z9189 Other specified personal risk factors, not elsewhere classified: Secondary | ICD-10-CM

## 2022-06-26 DIAGNOSIS — F419 Anxiety disorder, unspecified: Secondary | ICD-10-CM

## 2022-06-26 DIAGNOSIS — F1021 Alcohol dependence, in remission: Secondary | ICD-10-CM

## 2022-06-26 DIAGNOSIS — F4310 Post-traumatic stress disorder, unspecified: Secondary | ICD-10-CM

## 2022-06-26 LAB — TRANSTHORACIC ECHOCARDIOGRAM - ADULT
Aortic Valve Area by Continuity of Peak Velocity: 2.96 cm2
Aortic Valve Area by Continuity of VTI: 2.86 cm2
Ascending aorta: 3.01 cm
LV Diastolic Volume Index: 125.1 milliliter
LV Diastolic Volume Index: 142.1 milliliter
LV Diastolic Volume Index: 98.2 milliliter
LV Systolic Volume Index: 47.7 milliliter
LV Systolic Volume Index: 57.2 milliliter
LV Systolic Volume Index: 62.2 milliliter
LVOT diameter: 2.27 cm
LVOT stroke volume: 85.33 milliliter
MV Deceleration Time: 131.59 ms
MV Peak A Vel: 79.11 cm/s
MV Peak E Vel: 71.79 cm/s
MV VTI: 19.29 cm
MV mean gradient post stress: 53.91 cm/s
MV mean gradient post stress: 53.91 cm/s
MV mean gradient: 1.34 mmHg
MV peak gradient: 2.66 mmHg
MV valve area by continuity eq: 4.42 cm2
MV valve area by continuity eq: 4.42 cm2
Mitral Valve Max Velocity: 81.54 cm/s
Pulmonic Valve Acceleration Time: 87.54 ms
RVDD: 4.17 cm

## 2022-06-26 LAB — COMPREHENSIVE METABOLIC PANEL, NON-FASTING
ALBUMIN: 4 g/dL (ref 3.5–5.0)
ALKALINE PHOSPHATASE: 65 U/L (ref 45–115)
ALT (SGPT): 13 U/L (ref 10–55)
ANION GAP: 10 mmol/L (ref 4–13)
AST (SGOT): 9 U/L (ref 8–45)
BILIRUBIN TOTAL: 0.3 mg/dL (ref 0.3–1.3)
BUN/CREA RATIO: 24 — ABNORMAL HIGH (ref 6–22)
BUN: 36 mg/dL — ABNORMAL HIGH (ref 8–25)
CALCIUM: 9.9 mg/dL (ref 8.5–10.0)
CHLORIDE: 103 mmol/L (ref 96–111)
CO2 TOTAL: 25 mmol/L (ref 22–30)
CREATININE: 1.47 mg/dL — ABNORMAL HIGH (ref 0.75–1.35)
ESTIMATED GFR: 57 mL/min/BSA — ABNORMAL LOW (ref >=60)
GLUCOSE: 158 mg/dL — ABNORMAL HIGH (ref 65–125)
POTASSIUM: 4.9 mmol/L (ref 3.5–5.1)
PROTEIN TOTAL: 8.4 g/dL — ABNORMAL HIGH (ref 6.4–8.3)
SODIUM: 138 mmol/L (ref 136–145)

## 2022-06-26 LAB — CBC WITH DIFF
BASOPHIL #: <0.1 10*3/uL (ref <=0.20)
BASOPHIL %: 0 %
EOSINOPHIL #: <0.1 10*3/uL (ref <=0.50)
EOSINOPHIL %: 0 %
HCT: 35.3 % — ABNORMAL LOW (ref 38.9–52.0)
HGB: 11.8 g/dL — ABNORMAL LOW (ref 13.4–17.5)
IMMATURE GRANULOCYTE #: <0.1 10*3/uL (ref <0.10)
IMMATURE GRANULOCYTE %: 1 % (ref 0–1)
LYMPHOCYTE #: 1.64 10*3/uL (ref 1.00–4.80)
LYMPHOCYTE %: 14 %
MCH: 30.9 pg (ref 26.0–32.0)
MCHC: 33.4 g/dL (ref 31.0–35.5)
MCV: 92.4 fL (ref 78.0–100.0)
MONOCYTE #: 0.57 10*3/uL (ref 0.20–1.10)
MONOCYTE %: 5 %
MPV: 9.9 fL (ref 8.7–12.5)
NEUTROPHIL #: 9.36 10*3/uL — ABNORMAL HIGH (ref 1.50–7.70)
NEUTROPHIL %: 80 %
PLATELETS: 275 10*3/uL (ref 150–400)
RBC: 3.82 10*6/uL — ABNORMAL LOW (ref 4.50–6.10)
RDW-CV: 13.4 % (ref 11.5–15.5)
WBC: 11.7 10*3/uL — ABNORMAL HIGH (ref 3.7–11.0)

## 2022-06-26 LAB — HGA1C (HEMOGLOBIN A1C WITH EST AVG GLUCOSE)
ESTIMATED AVERAGE GLUCOSE: 114 mg/dL
HEMOGLOBIN A1C: 5.6 % (ref 4.0–5.6)

## 2022-06-26 LAB — RED TOP TUBE

## 2022-06-26 LAB — LIPID PANEL
CHOL/HDL RATIO: 6
CHOLESTEROL: 186 mg/dL (ref 100–200)
HDL CHOL: 31 mg/dL — ABNORMAL LOW (ref >=50)
LDL CALC: 117 mg/dL — ABNORMAL HIGH (ref <100)
NON-HDL: 155 mg/dL (ref <=190)
TRIGLYCERIDES: 215 mg/dL — ABNORMAL HIGH (ref <150)
VLDL CALC: 37 mg/dL — ABNORMAL HIGH (ref <30)

## 2022-06-26 LAB — HIV1/HIV2 SCREEN, COMBINED ANTIGEN AND ANTIBODY: HIV SCREEN, COMBINED ANTIGEN & ANTIBODY: NEGATIVE

## 2022-06-26 MED ORDER — TRAZODONE 100 MG TABLET
100.0000 mg | ORAL_TABLET | Freq: Every evening | ORAL | 1 refills | Status: DC
Start: 2022-06-26 — End: 2022-12-26
  Filled 2022-06-26: qty 30, 30d supply, fill #0
  Filled 2022-07-26: qty 30, 30d supply, fill #1
  Filled 2022-08-26: qty 30, 30d supply, fill #2
  Filled 2022-09-28: qty 30, 30d supply, fill #3
  Filled 2022-10-26: qty 30, 30d supply, fill #4
  Filled 2022-11-25: qty 30, 30d supply, fill #5

## 2022-06-26 MED ORDER — DULOXETINE 60 MG CAPSULE,DELAYED RELEASE
60.0000 mg | DELAYED_RELEASE_CAPSULE | Freq: Every day | ORAL | 1 refills | Status: DC
Start: 2022-06-26 — End: 2022-12-26
  Filled 2022-06-26 – 2022-06-29 (×2): qty 30, 30d supply, fill #0
  Filled 2022-08-01: qty 30, 30d supply, fill #1
  Filled 2022-08-26: qty 30, 30d supply, fill #2
  Filled 2022-09-28: qty 30, 30d supply, fill #3
  Filled 2022-10-26: qty 30, 30d supply, fill #4
  Filled 2022-11-25: qty 30, 30d supply, fill #5
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
## 2022-06-29 ENCOUNTER — Other Ambulatory Visit: Payer: Self-pay

## 2022-06-29 ENCOUNTER — Other Ambulatory Visit (HOSPITAL_BASED_OUTPATIENT_CLINIC_OR_DEPARTMENT_OTHER): Payer: Self-pay

## 2022-06-29 DIAGNOSIS — R7989 Other specified abnormal findings of blood chemistry: Secondary | ICD-10-CM

## 2022-06-29 LAB — TESTOSTERONE, TOTAL, MS: TESTOSTERONE,TOTAL,LC/MS/MS: 184 ng/dL — ABNORMAL LOW (ref 250–1100)

## 2022-06-30 ENCOUNTER — Other Ambulatory Visit: Payer: Self-pay

## 2022-06-30 DIAGNOSIS — F419 Anxiety disorder, unspecified: Secondary | ICD-10-CM

## 2022-06-30 DIAGNOSIS — F32A Depression, unspecified: Secondary | ICD-10-CM

## 2022-06-30 DIAGNOSIS — F4310 Post-traumatic stress disorder, unspecified: Secondary | ICD-10-CM

## 2022-07-01 ENCOUNTER — Telehealth (HOSPITAL_BASED_OUTPATIENT_CLINIC_OR_DEPARTMENT_OTHER): Payer: Self-pay

## 2022-07-01 ENCOUNTER — Other Ambulatory Visit (HOSPITAL_BASED_OUTPATIENT_CLINIC_OR_DEPARTMENT_OTHER): Payer: Self-pay

## 2022-07-01 DIAGNOSIS — E875 Hyperkalemia: Secondary | ICD-10-CM

## 2022-07-01 DIAGNOSIS — M170 Bilateral primary osteoarthritis of knee: Secondary | ICD-10-CM

## 2022-07-01 DIAGNOSIS — M160 Bilateral primary osteoarthritis of hip: Secondary | ICD-10-CM

## 2022-07-01 NOTE — Telephone Encounter (Deleted)
-----   Message from Josephine Cables, RN sent at 07/01/2022  4:38 PM EDT -----  Leonard [name] and spoke with patient. Notified of test results. Patient agreeable to come in for follow up urine. Patient asking about testosterone results. Please place result note for this.   Josephine Cables, RN

## 2022-07-01 NOTE — Telephone Encounter (Signed)
Called and discussed testosterone results with patient. As he is continuing to withhold from alcohol and risk of testosterone with his cardiac history, we are planning on rechecking in 3-6 months.     Along with this, patient endorsing he is peeing all of the time. Feels likes more than ever. With recent laboratory work showing elevation in creatine + BUN/Creatine Ratio >20, will trial lasix 20 mg instead of 40 mg. Patient to re-obtain BMP in [redacted] week along with urinalysis.     Patient given warning signs of fluid overload and precautions to call clinic. Patient agreeable.     Hermenia Fiscal, MD  PGY-2 Dept. Family Medicine  07/01/2022, 19:22    This document was at least partially generated using a voice recognition system and transcription. All documents are proofed as best as possible, but it may have misspelled words, incorrect words, or syntax and grammatical errors because of the imperfect nature of the system.

## 2022-07-02 ENCOUNTER — Other Ambulatory Visit: Payer: Self-pay

## 2022-07-09 ENCOUNTER — Other Ambulatory Visit: Payer: Self-pay

## 2022-07-10 ENCOUNTER — Other Ambulatory Visit: Payer: MEDICAID | Attending: Family Medicine | Admitting: Rheumatology

## 2022-07-10 ENCOUNTER — Telehealth (HOSPITAL_COMMUNITY): Payer: Self-pay

## 2022-07-10 ENCOUNTER — Other Ambulatory Visit (HOSPITAL_COMMUNITY): Payer: Self-pay

## 2022-07-10 ENCOUNTER — Other Ambulatory Visit: Payer: Self-pay

## 2022-07-10 ENCOUNTER — Ambulatory Visit (INDEPENDENT_AMBULATORY_CARE_PROVIDER_SITE_OTHER): Payer: MEDICAID

## 2022-07-10 DIAGNOSIS — F32A Depression, unspecified: Secondary | ICD-10-CM

## 2022-07-10 DIAGNOSIS — F419 Anxiety disorder, unspecified: Secondary | ICD-10-CM

## 2022-07-10 DIAGNOSIS — E875 Hyperkalemia: Secondary | ICD-10-CM | POA: Insufficient documentation

## 2022-07-10 DIAGNOSIS — F1021 Alcohol dependence, in remission: Secondary | ICD-10-CM

## 2022-07-10 DIAGNOSIS — F1291 Cannabis use, unspecified, in remission: Secondary | ICD-10-CM

## 2022-07-10 DIAGNOSIS — I509 Heart failure, unspecified: Secondary | ICD-10-CM

## 2022-07-10 DIAGNOSIS — F1491 Cocaine use, unspecified, in remission: Secondary | ICD-10-CM

## 2022-07-10 DIAGNOSIS — R7989 Other specified abnormal findings of blood chemistry: Secondary | ICD-10-CM | POA: Insufficient documentation

## 2022-07-10 LAB — URINALYSIS, MACROSCOPIC
BILIRUBIN: NEGATIVE mg/dL
BLOOD: NEGATIVE mg/dL
COLOR: NORMAL
GLUCOSE: NEGATIVE mg/dL
KETONES: NEGATIVE mg/dL
LEUKOCYTES: NEGATIVE WBCs/uL
NITRITE: NEGATIVE
PH: 5 (ref 5.0–8.0)
PROTEIN: NEGATIVE mg/dL
SPECIFIC GRAVITY: 1.009 (ref 1.005–1.030)
UROBILINOGEN: NEGATIVE mg/dL

## 2022-07-10 LAB — MICROALBUMIN/CREATININE RATIO, URINE, RANDOM
CREATININE RANDOM URINE: 44 mg/dL — ABNORMAL LOW (ref 50–100)
MICROALBUMIN RANDOM URINE: <0.5 mg/dL

## 2022-07-10 LAB — BASIC METABOLIC PANEL
ANION GAP: 8 mmol/L (ref 4–13)
BUN/CREA RATIO: 16 (ref 6–22)
BUN: 19 mg/dL (ref 8–25)
CALCIUM: 10.2 mg/dL — ABNORMAL HIGH (ref 8.5–10.0)
CHLORIDE: 105 mmol/L (ref 96–111)
CO2 TOTAL: 24 mmol/L (ref 22–30)
CREATININE: 1.18 mg/dL (ref 0.75–1.35)
ESTIMATED GFR: 74 mL/min/BSA (ref >=60)
GLUCOSE: 87 mg/dL (ref 65–125)
POTASSIUM: 4.6 mmol/L (ref 3.5–5.1)
SODIUM: 137 mmol/L (ref 136–145)

## 2022-07-10 LAB — URINALYSIS, MICROSCOPIC
HYALINE CASTS: 12 /lpf — ABNORMAL HIGH (ref <4.0)
RBCS: <1 /hpf (ref <6.0)
WBCS: <1 /hpf (ref <4.0)

## 2022-07-10 NOTE — Telephone Encounter (Signed)
Called patient to discuss improvement in creatine with lower dose at 20 mg lasix. Overall does not feel fluid overloaded. Plan to stay at 20 mg lasix.     Will place cardiology referral.     Patient last hemoglobin A1C of 5.6. Was taking 500 mg metformin. Interested in trial of no medication since he is now living a healthier lifestyle. Plan to d/c metformin and recheck A1C in 3 month.     Hermenia Fiscal, MD  PGY-2 Dept. Family Medicine  07/10/2022, 19:57    This document was at least partially generated using a voice recognition system and transcription. All documents are proofed as best as possible, but it may have misspelled words, incorrect words, or syntax and grammatical errors because of the imperfect nature of the system.

## 2022-07-14 NOTE — Progress Notes (Signed)
[facility] [employer]  & [facility]  Department of [place] Medicine and Psychiatry  [facility]    New Patient Intake      PATIENT NAME:  Leonard [name]  DATE OF BIRTH:  November 18, 1970  CHART NUMBER: [id number]  PAYOR SOURCE:    Payor: [employer] - [place] / Plan: [employer] BETTER [employer] - [place] / Product Type: Medicaid MC /   LOS: [phone]    DATE OF SERVICE: 07/10/2022    Time started: 3:03 -  Time stopped: 3:50    Chief Complaint(s)  Client states he was referred for aftercare by [facility] where he attended 30 days rehab graduating on August 10th. He states he has 70 days clean and is attending NA meetings. He states it had been 18 years since he has been completely sober.     History of Present Illness  He states he started using alcohol when 51 y/o and that it became a problem for his around 51 y/o. He attended rehab when he was 51 y/o and saw a psychiatrist every 6 months for anxiety and ADHD and was taking ritalin. He states his anxiety was "real bad". He states his father, who was an alcoholic, was not around until he was 17 and is grandfather was an alcoholic who "beat everyone" including his mother and other relatives. He states he was raised by his grandmother and aunt while his mother was attending medical school and training to be a pediatrician. 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 He states he was also in [place] for 5 years because he was arrested for selling drugs. He appears to be highly motivated to stay sober.     Substance Use History  Substance Use: Alcohol, cannabis, cocaine    Gambling: no    Substance Abuse Treatment: Rehab twice    Legal History: Prison for 5 years for selling drugs    Past Psychiatric History  Current providers- Dr. 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, (normal echo after 2 weeks), surgery for diverticulitis, anxiety, depression, osteoarthritis of left hip, type II diabetes    Allergy and Current Medications  Allergies as of 07/10/2022    (No Known Allergies)       Current Outpatient Medications:     DULoxetine (CYMBALTA DR) 60 mg Oral Capsule, Delayed Release(E.C.), Take 1 Capsule (60 mg total) by mouth Once a day, Disp: 90 Capsule, Rfl: 1    folic acid (FOLVITE) 1 mg Oral Tablet, Take 1 Tablet (1 mg total) by mouth Once a day, Disp: 30 Tablet, Rfl: 1    furosemide (LASIX) 40 mg Oral Tablet, Take 1 Tablet (40 mg total) by mouth Once a day, Disp: 30 Tablet, Rfl: 1    lidocaine 4 % Adhesive Patch, Medicated, Place 2 Patches on the skin Once a day to the left hip, Disp: 60 Patch, Rfl: 2    melatonin 3 mg Oral Tablet, Take 2 Tablets (6 mg total) by mouth Every night, Disp: 60 Tablet, Rfl: 2    meloxicam (MOBIC) 15 mg Oral Tablet, Take 1  Tablet (15 mg total) by mouth Once a day, Disp: 30 Tablet, Rfl: 5    metFORMIN (GLUCOPHAGE) 500 mg Oral Tablet, Take 1 Tablet (500 mg total) by mouth Every morning with breakfast, Disp: 30 Tablet, Rfl: 1    metoprolol succinate (TOPROL-XL) 25 mg Oral Tablet Sustained Release 24 hr, Take 1 Tablet (25 mg total) by mouth Once a day, Disp: 30 Tablet, Rfl: 1    naloxone (KLOXXADO) 8 mg/actuation Nasal Spray, Non-Aerosol, 0.1 mL (8 mg total) by nasal (alternating) route Every 2 minutes as needed for actual or suspected opioid overdose. Alternate nostrils if needing more than one dose. Call 911 if used., Disp: 4 Each, Rfl: 11    nicotine (NICODERM CQ) 21 mg/24 hr Transdermal Patch 24 hr, Place 1 Patch (21 mg total) on the skin Once a day Indications: stop smoking, Disp: 28 Patch, Rfl: 2    nicotine polacrilex (NICORETTE) 4 mg Buccal Gum, Take 1 Each (4 mg total) by mouth  Every 2 hours as needed for Nicotine withdrawal Indications: stop smoking, Disp: 100 Each, Rfl: 5    pantoprazole (PROTONIX) 40 mg Oral Tablet, Delayed Release (E.C.), Take 1 Tablet (40 mg total) by mouth Once a day, Disp: 30 Tablet, Rfl: 2    sacubitriL-valsartan (ENTRESTO) 49-51 mg Oral Tablet, Take 1 Tablet by mouth Twice daily, Disp: 60 Tablet, Rfl: 1    sennosides-docusate sodium (SENOKOT-S) 8.6-50 mg Oral Tablet, Take 1 Tablet by mouth Twice per day as needed (Constipation), Disp: 60 Tablet, Rfl: 2    spironolactone (ALDACTONE) 25 mg Oral Tablet, Take 1 Tablet (25 mg total) by mouth Every morning with breakfast, Disp: 30 Tablet, Rfl: 1    traZODone (DESYREL) 100 mg Oral Tablet, Take 1 Tablet (100 mg total) by mouth Every night, Disp: 90 Tablet, Rfl: 1    Social History  Patient is a 51 y.o. White male from [place] [address], [place].  Leonard [name] is currently single.  He does have children, one son 57 y/o.  He denies significant financial stressors or debt. He report living at with his brother in [place] at this time. He states he has three brothers with whom get "get along okay"     Client states his childhood development was normal except for ADHD. 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 He  does not  report having difficulty with literacy; can read and write adequately.  Client reports past legal concerns, spending 5 years in prison for selling drugs. Client denies service in the [employer].    Mental Status    Eye Contact:  good.    Behavior:  cooperative.    Attention:  good.    Speech:  normal rate and volume.    Motor:  no psychomotor retardation or agitation.    Mood:  anxious.    Affect:  congruent to mood.    Thought Process:  linear.    Thought Content:  no paranoia or delusions.    Perception:  no hallucinations endorsed  Cognition:  good abstract ability.    Insight:  fair.    Judgement:  good.  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 Sharmon Revere)  Rule out PTSD from childhood and potential prison exposures (Per Dr. Sharmon Revere)  Positive Supports:   He states his brother and couple of close friends are supportive    History of Suicidal/Homicidal/Self-Injurious Behaviors:  denies both    Summary: Leonard [name] is a 51 y.o. single White male with technical training-level education. He has presented at [facility] for an intake assessment and primary concern of recovery for alcohol use disorder and anxiety. [place] does have a history of psychiatric treatment. He is Fully oriented to person, place, time and situation.  .   Blanton's current stressors include Financial Stressors and Unemployed.    Treatment plan at this time will be for client to attend individual therapy sessions and supportive therapy for ongoing recovery and CBT for anxiety.  Client's goals for treatment include To effectively manage recovery process by utilizing appropriate coping strategies, developing and using coping skills and developing and using relaxation techniques.        Rosalyn Gess Greig Castilla, [place], NCC, Provisionally Licensed Counselor  Clinical Therapist   Department of Behavioral Medicine & Psychiatry  07/14/2022, 11:38      I was available for discussion of the case with therapist.  I have reviewed the note for this patient.    Judeth Porch, MD 07/15/2022 07:27  Staff Psychiatrist

## 2022-07-17 LAB — FECAL DNA TESTING (AMB): FECAL DNA TEST (AMB): POSITIVE — AB

## 2022-07-18 ENCOUNTER — Other Ambulatory Visit (HOSPITAL_BASED_OUTPATIENT_CLINIC_OR_DEPARTMENT_OTHER): Payer: Self-pay

## 2022-07-18 DIAGNOSIS — Z1211 Encounter for screening for malignant neoplasm of colon: Secondary | ICD-10-CM

## 2022-07-24 ENCOUNTER — Other Ambulatory Visit: Payer: Self-pay

## 2022-07-24 ENCOUNTER — Ambulatory Visit (INDEPENDENT_AMBULATORY_CARE_PROVIDER_SITE_OTHER): Payer: MEDICAID

## 2022-07-24 DIAGNOSIS — F1021 Alcohol dependence, in remission: Secondary | ICD-10-CM

## 2022-07-26 ENCOUNTER — Other Ambulatory Visit: Payer: Self-pay

## 2022-07-26 DIAGNOSIS — F32A Depression, unspecified: Secondary | ICD-10-CM

## 2022-07-27 NOTE — Progress Notes (Cosign Needed)
[place]   Outpatient Progress Note    Client: Leonard [name]  Medical Record Number: [id number]  Payor Source: Payor: [place] / Plan: [place] / Product Type: Medicaid MC /   Date: 07/24/2022   CPT Code: 27062  SESSION Information      Start Time: 2:59  End Time: 3:33    Subjective:   Leonard [name] presents today for an individual therapy session to continue addressing ongoing struggles related to recovery. Current symptoms include hip pain, dreams related to alcohol, otherwise doing well. Current stressors he is experiencing include: financial. He states that he wanted to quit drinking before rehab and realized he could not do it on his own.  He states he has been clean for 75 days and is proud of himself. He has been attending NA meetings.     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 Susa Raring)  Rule out PTSD from childhood and potential prison exposures (Per Dr. Susa Raring)     Procedures:    Client attended an individual therapy session. developing and using coping skills, discussion of behaviors for recovery related to people, place and things. Also discussion of what helps this client stay clean.     Goals:   To effectively manage recovery by applying appropriate behavioral and coping skills.     PLAN:    Review status of recovery next session. Will encourage Leonard [name] to continue with his characteristic self sufficiency and motivation to say clean, maintain appropriate behaviors and to pursue his person goal of finding work that will help him maintain recovery and not put him at  risk for relapse. Return to clinic in 1 month or sooner if needed.     Leonard [name], [place], [place], Provisionally Licensed Counselor  Clinical Therapist  [place] [facility] [place] [facility]  07/27/2022, 11:03  Late Entry for: 07/24/2022

## 2022-07-31 ENCOUNTER — Other Ambulatory Visit (INDEPENDENT_AMBULATORY_CARE_PROVIDER_SITE_OTHER): Payer: Self-pay | Admitting: Family

## 2022-07-31 ENCOUNTER — Other Ambulatory Visit: Payer: Self-pay

## 2022-07-31 ENCOUNTER — Encounter (INDEPENDENT_AMBULATORY_CARE_PROVIDER_SITE_OTHER): Payer: Self-pay

## 2022-07-31 DIAGNOSIS — Z1211 Encounter for screening for malignant neoplasm of colon: Secondary | ICD-10-CM

## 2022-07-31 MED ORDER — PEG 3350-ELECTROLYTES 236 GRAM-22.74 GRAM-6.74 GRAM-5.86 GRAM SOLUTION
4.0000 L | Freq: Once | ORAL | 0 refills | Status: AC
Start: 2022-07-31 — End: 2023-06-18
  Filled 2022-07-31 – 2023-06-17 (×2): qty 4000, 1d supply, fill #0

## 2022-08-01 ENCOUNTER — Other Ambulatory Visit: Payer: Self-pay

## 2022-08-01 DIAGNOSIS — F32A Depression, unspecified: Secondary | ICD-10-CM

## 2022-08-01 DIAGNOSIS — F419 Anxiety disorder, unspecified: Secondary | ICD-10-CM

## 2022-08-01 DIAGNOSIS — F4310 Post-traumatic stress disorder, unspecified: Secondary | ICD-10-CM

## 2022-08-06 ENCOUNTER — Encounter (HOSPITAL_BASED_OUTPATIENT_CLINIC_OR_DEPARTMENT_OTHER): Payer: Self-pay | Admitting: ORTHOPEDIC, SPORTS MEDICINE

## 2022-08-06 ENCOUNTER — Other Ambulatory Visit: Payer: Self-pay

## 2022-08-06 ENCOUNTER — Ambulatory Visit: Payer: MEDICAID | Attending: ORTHOPEDIC, SPORTS MEDICINE | Admitting: ORTHOPEDIC, SPORTS MEDICINE

## 2022-08-06 VITALS — Temp 97.0°F | Ht 71.14 in | Wt 232.4 lb

## 2022-08-06 DIAGNOSIS — M1612 Unilateral primary osteoarthritis, left hip: Secondary | ICD-10-CM | POA: Insufficient documentation

## 2022-08-06 DIAGNOSIS — M1711 Unilateral primary osteoarthritis, right knee: Secondary | ICD-10-CM | POA: Insufficient documentation

## 2022-08-06 DIAGNOSIS — M1712 Unilateral primary osteoarthritis, left knee: Secondary | ICD-10-CM | POA: Insufficient documentation

## 2022-08-06 DIAGNOSIS — M170 Bilateral primary osteoarthritis of knee: Secondary | ICD-10-CM

## 2022-08-06 NOTE — Progress Notes (Signed)
Name: Leonard [name]  MEDICAL RECORD [id number]  DATE OF BIRTH: 16-Feb-1971  DATE OF SERVICE: 08/06/2022    SUBJECTIVE:  Leonard [name] presents to clinic today for follow up of Hip Pain (Left) and Knee Pain (Bilateral)  He was previously noted to have fairly advanced left hip and right knee arthritis.  He was also having some left knee pain with very mild arthritis.  We previously felt that the worse symptoms were in his hip and I thought a lot of the left knee pain was probably referred from his hip as well. We had done a corticosteroid injection of the left hip at the last visit.  He noted significant improvement but really just took the constant ache away from the hip and he still has some pain.  He rates his hip pain as a 4 out of 10 at worst.  Prior to the injection it was 8 out of 10.    He is still having significant pain in his knees and rates his knee pain as 6 to 8 out of 10.  Right knee is a little bit worse than the left.        MEDICATIONS  Current Outpatient Medications   Medication Sig    DULoxetine (CYMBALTA DR) 60 mg Oral Capsule, Delayed Release(E.C.) Take 1 Capsule (60 mg total) by mouth Once a day    folic acid (FOLVITE) 1 mg Oral Tablet Take 1 Tablet (1 mg total) by mouth Once a day    furosemide (LASIX) 40 mg Oral Tablet Take 1 Tablet (40 mg total) by mouth Once a day    lidocaine 4 % Adhesive Patch, Medicated Place 2 Patches on the skin Once a day to the left hip    melatonin 3 mg Oral Tablet Take 2 Tablets (6 mg total) by mouth Every night    meloxicam (MOBIC) 15 mg Oral Tablet Take 1 Tablet (15 mg total) by mouth Once a day    metFORMIN (GLUCOPHAGE) 500 mg Oral Tablet Take 1 Tablet (500 mg total) by mouth Every morning with breakfast    metoprolol succinate (TOPROL-XL) 25 mg Oral Tablet Sustained Release 24 hr Take 1 Tablet (25 mg total) by mouth Once a day    naloxone (KLOXXADO) 8 mg/actuation Nasal Spray, Non-Aerosol 0.1 mL (8 mg total) by nasal (alternating) route Every 2 minutes as needed for  actual or suspected opioid overdose. Alternate nostrils if needing more than one dose. Call 911 if used.    nicotine (NICODERM CQ) 21 mg/24 hr Transdermal Patch 24 hr Place 1 Patch (21 mg total) on the skin Once a day Indications: stop smoking    nicotine polacrilex (NICORETTE) 4 mg Buccal Gum Take 1 Each (4 mg total) by mouth Every 2 hours as needed for Nicotine withdrawal Indications: stop smoking    pantoprazole (PROTONIX) 40 mg Oral Tablet, Delayed Release (E.C.) Take 1 Tablet (40 mg total) by mouth Once a day    sacubitriL-valsartan (ENTRESTO) 49-51 mg Oral Tablet Take 1 Tablet by mouth Twice daily    sennosides-docusate sodium (SENOKOT-S) 8.6-50 mg Oral Tablet Take 1 Tablet by mouth Twice per day as needed (Constipation)    spironolactone (ALDACTONE) 25 mg Oral Tablet Take 1 Tablet (25 mg total) by mouth Every morning with breakfast    traZODone (DESYREL) 100 mg Oral Tablet Take 1 Tablet (100 mg total) by mouth Every night     ALLERGIES  No Known Allergies    PHYSICAL EXAM  Temp 36.1 C (97 F) (Thermal  Scan)   Ht 1.807 m (5' 11.14")   Wt 105 kg (232 lb 5.8 oz)   BMI 32.28 kg/m        Left hip:  He has pain with the full internal rotation of the hip.  Straight leg raise is negative.  Left knee:  There is mild tenderness noted across the patella and over the medial joint line.  Right knee:  He is exquisitely tender over the medial joint line, less so over the lateral joint line.  There is patellar crepitation on the right knee.          ASSESSMENT:  Assessment/Plan   1. Primary osteoarthritis of right knee    2. Primary osteoarthritis of left knee    3. Arthritis of left hip      PLAN:  We proceeded with bilateral knee injections today.  He does feel that his pain is bad enough at this point he would consider surgery and is interested in arthroplasty of the hip and possibly of the knee.  We will go ahead and refer to the joint center for further evaluation and treatment.     If the patient has any questions  or concerns, he can contact our office.      Corena Pilgrim, MD 08/06/2022, 13:58

## 2022-08-06 NOTE — Procedures (Signed)
SPORTS MEDICINE, [facility]  [place] [address]  Operated by [facility]  Procedure Note    Name: Leonard [name] MRN:  [id number]   Date: 08/06/2022 Age: 51 y.o.  DOB:   01-16-1971       Large Joint Arthrocentesis: bilateral knee    The risks and benefits of the injection were explained to the patient and the decision was made to proceed with the injection.  The skin was prepped with betadine and the injection was carried out using 2 cc of 1% lidocaine, 2 cc of 0.5% marcaine, and Depomedrol  40mg .  This was injected into each knee.  The skin was cleaned with alcohol and a sterile bandage was placed over the injection site. The patient tolerated the injection well.    Consent was given by the patient.           Corena Pilgrim, MD

## 2022-08-07 ENCOUNTER — Other Ambulatory Visit: Payer: Self-pay

## 2022-08-07 ENCOUNTER — Ambulatory Visit: Payer: MEDICAID | Attending: Advanced Heart Failure and Transplant Cardiology | Admitting: Internal Medicine

## 2022-08-07 ENCOUNTER — Encounter (HOSPITAL_COMMUNITY): Payer: Self-pay | Admitting: Internal Medicine

## 2022-08-07 VITALS — BP 129/84 | HR 77 | Temp 97.8°F | Ht 71.0 in | Wt 232.4 lb

## 2022-08-07 DIAGNOSIS — I509 Heart failure, unspecified: Secondary | ICD-10-CM | POA: Insufficient documentation

## 2022-08-07 DIAGNOSIS — F419 Anxiety disorder, unspecified: Secondary | ICD-10-CM | POA: Insufficient documentation

## 2022-08-07 DIAGNOSIS — E119 Type 2 diabetes mellitus without complications: Secondary | ICD-10-CM | POA: Insufficient documentation

## 2022-08-07 DIAGNOSIS — F32A Depression, unspecified: Secondary | ICD-10-CM | POA: Insufficient documentation

## 2022-08-07 DIAGNOSIS — E669 Obesity, unspecified: Secondary | ICD-10-CM | POA: Insufficient documentation

## 2022-08-07 MED ORDER — SILDENAFIL 25 MG TABLET
25.0000 mg | ORAL_TABLET | ORAL | 0 refills | Status: AC | PRN
Start: 2022-08-07 — End: ?
  Filled 2022-08-07: qty 24, 24d supply, fill #0

## 2022-08-07 MED ORDER — ATORVASTATIN 20 MG TABLET
20.0000 mg | ORAL_TABLET | Freq: Every day | ORAL | 4 refills | Status: DC
Start: 2022-08-07 — End: 2022-10-22
  Filled 2022-08-07: qty 90, 90d supply, fill #0

## 2022-08-07 NOTE — Patient Instructions (Addendum)
Follow-up in 2 months, sleep study, they would send this to you in your mail  start Lipitor 20mg    Viagra 1 hour before act   Can stop furosemide/take it as needed on

## 2022-08-07 NOTE — Progress Notes (Signed)
Cardiology, [place]  Leonard [name] [id number]  [phone]    Cardiology  New Patient Clinic Note    Name: Leonard [name]   DOB: [birth year]  [51 y.o. male]   MRN: [id number]       Visit Date: 08/07/2022   Referring: Andris Flurry, MD  [address]  [place],  [place] [address]   PCP: Titus Dubin, MD       Reason For Visit:  Initial office visit with cardiology.  Chief Complaint: Establish Care    Information provided by: patient      History of Present Illness    [name] is a 51 y.o. White male who presents to establish care    Patient was sent over by PCP, prior history of nonischemic cardiomyopathy EF unknown but less than 40% per patient he was seen at [facility] [place], Dr. 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  He also has history of pre diabetes, is on metformin but he is stopped taking that recently.      The patient also states that he has symptoms of fatigue tiredness during the day as well as snoring at night along with observed apnea    Review of system is otherwise negative his functional status is greater than 5 Mets he is independent with all activity of daily living he does however smoke at this time        Hypertension     Stress Test Performed     Abnormal EKG     Myocardial Infarction     Coronary Artery Disease     CABG     Dysrhythmias     Valvular Disease     PAD     Atrial Fibrillation     Diabetes mellitus Yes   Chronic Lung Disease     Heart failure Yes   Elevated Calcium Score     Cerebrovascular Disease     Dyslipidemia     On  Dialysis     History of PCI         Subjective     Cardiac Comorbidities    Heart failure  Yes   Family history of heart failure  No    Family history of sudden cardiac death  No   Pulmonary hypertension  No   ASCVD score The 10-year ASCVD risk score (Arnett DK, et al., 2019) is: 22.9%    Values used to calculate the score:      Age: 30 years      Sex: Male      Is Non-Hispanic African American: No      Diabetic: Yes      Tobacco smoker: Yes      Systolic Blood Pressure: 027 mmHg      Is BP treated: No      HDL Cholesterol: 31 mg/dL      Total Cholesterol: 186 mg/dL    Chronic Kidney Disease  Frank IP Heart Failure Renal Failure Yes No If yes type: No       Allergies  No Known Allergies  Medications    Current Outpatient Medications   Medication Sig    DULoxetine (CYMBALTA DR) 60 mg Oral Capsule, Delayed Release(E.C.) Take 1 Capsule (60 mg total) by mouth Once a day    folic acid (FOLVITE) 1 mg Oral Tablet Take 1 Tablet (1 mg total) by mouth Once a day    furosemide (LASIX) 40 mg Oral Tablet Take 1 Tablet (40 mg total) by mouth Once a day    lidocaine 4 % Adhesive Patch, Medicated Place 2 Patches on the skin Once a day to the left hip    melatonin 3 mg Oral Tablet Take 2 Tablets (6 mg total) by mouth Every night    meloxicam (MOBIC) 15 mg Oral Tablet Take 1 Tablet (15 mg total) by mouth Once a day    metFORMIN (GLUCOPHAGE) 500 mg Oral Tablet Take 1 Tablet (500 mg total) by mouth Every morning with breakfast    metoprolol succinate (TOPROL-XL) 25 mg Oral Tablet Sustained Release 24 hr Take 1 Tablet (25 mg total) by mouth Once a day    naloxone (KLOXXADO) 8 mg/actuation Nasal Spray, Non-Aerosol 0.1 mL (8 mg total) by nasal (alternating) route Every 2 minutes as needed for actual or suspected opioid overdose. Alternate nostrils if needing more than one dose. Call 911 if used.    nicotine (NICODERM CQ) 21 mg/24 hr Transdermal Patch 24 hr Place 1 Patch (21 mg total) on the skin Once a day Indications: stop smoking     nicotine polacrilex (NICORETTE) 4 mg Buccal Gum Take 1 Each (4 mg total) by mouth Every 2 hours as needed for Nicotine withdrawal Indications: stop smoking    pantoprazole (PROTONIX) 40 mg Oral Tablet, Delayed Release (E.C.) Take 1 Tablet (40 mg total) by mouth Once a day    sacubitriL-valsartan (ENTRESTO) 49-51 mg Oral Tablet Take 1 Tablet by mouth Twice daily    sennosides-docusate sodium (SENOKOT-S) 8.6-50 mg Oral Tablet Take 1 Tablet by mouth Twice per day as needed (Constipation)    spironolactone (ALDACTONE) 25 mg Oral Tablet Take 1 Tablet (25 mg total) by mouth Every morning with breakfast    traZODone (DESYREL) 100 mg Oral Tablet Take 1 Tablet (100 mg total) by mouth Every night        There are no discontinued medications.      History    Social History     Socioeconomic History    Marital status: Single   Tobacco Use    Smoking status: Every Day     Packs/day: .5     Types: Cigarettes    Smokeless tobacco: Never   Substance and Sexual Activity    Alcohol use: Not Currently     Comment: Drinking from 1 pint to 1/5 liquor per day    Drug use: Not Currently     Frequency: 3.0 times per week     Types: Cocaine     Comment: 3 times per week. not used for couple months 05/16/2022    Sexual activity: Yes     Partners: Female       Past Surgical History:   Procedure Laterality Date    INGUINAL HERNIA REPAIR  1990       Family Medical History:       Problem Relation (Age of Onset)    Alzheimer's/Dementia Mother    Breast Cancer Maternal Grandmother    Cancer Maternal Grandmother, Maternal Grandfather, Paternal Grandfather    Congestive Heart Failure Father    No Known Problems Brother  Functional capacity:  Moderately active (cleaning, brisk walk, mowing lawn, etc)       Objective   Review of Systems  Other than ROS in the HPI, all other systems were negative.    Physical Exam    BP 129/84 (Site: Left, Patient Position: Sitting, Cuff Size: Adult Large)   Pulse 77   Temp 36.6 C (97.8 F) (Temporal)    Ht 1.803 m (5\' 11" )   Wt 105 kg (232 lb 5.8 oz)   SpO2 98% Comment: RA  BMI 32.41 kg/m         Constitutional: AA&O X3 Well developed and well nourished in no acute distress  Eyes: Conjunctiva clear, Pupils equal, round and reactive to light  HENT: Head is normocephalic, atraumatic   Neck: Normal ROM, Supple, symmetrical  Respiratory: Effort normal, clear to auscultation bilaterally.  Cardiovascular: Regular heart rate and rhythm; S1 and S2 normal; no murmur, click, rub or gallop  Gastrointestinal: Bowel sounds normal; soft, non distended non-tender to palpation  Extremities: extremities normal, atraumatic, no cyanosis or edema  Integumentary:  Skin warm and dry  Neurologic: Grossly normal, no focal neuro deficit, normal coordination and gait  Psychiatric: normal affect and speech.       Laboratory  I have reviewed the patient's lab values and culture results. Pertinent results are below:    CBC    Lab Results   Component Value Date    WBC 11.7 (H) 06/26/2022    HGB 11.8 (L) 06/26/2022    HCT 35.3 (L) 06/26/2022    PLTCNT 275 06/26/2022    RBC 3.82 (L) 06/26/2022    MCV 92.4 06/26/2022    MCHC 33.4 06/26/2022    MCH 30.9 06/26/2022    MPV 9.9 06/26/2022        Comprehensive Metabolic Profile   Lab Results   Component Value Date    SODIUM 137 07/10/2022    POTASSIUM 4.6 07/10/2022    CHLORIDE 105 07/10/2022    CO2 24 07/10/2022    ANIONGAP 8 07/10/2022    BUN 19 07/10/2022    CREATININE 1.18 07/10/2022    GLUCOSE Negative 07/10/2022      Lab Results   Component Value Date    ALBUMIN 4.0 06/26/2022    TOTALPROTEIN 8.4 (H) 06/26/2022    AST 9 06/26/2022    ALT 13 06/26/2022          Lab Results   Component Value Date    HA1C 5.6 06/26/2022       Lab Results   Component Value Date    TRIG 215 (H) 06/26/2022    HDLCHOL 31 (L) 06/26/2022    LDLCHOL 117 (H) 06/26/2022    CHOLESTEROL 186 06/26/2022       Diagnostics    Cardiology Diagnostics          06/26/2022    09:03 08/07/2022    14:53   EKG   ECG 12-LEAD  ECG 12-LEAD  - ADD ON IN [facility] CLINICS ONLY Order Status: Arrived   Echo   TRANSTHORACIC ECHOCARDIOGRAM - ADULT TRANSTHORACIC ECHOCARDIOGRAM - ADULT Order Status: Final result        Previous ECG Results    Echocardiogram    Previous stress test results    Cath    Cardiac CT    Cardiac MRI               Assessment and Plan:    Xzavior is a 51 y.o. male with:  Assessment/Plan   1. Congestive heart failure, unspecified HF chronicity, unspecified heart failure type (CMS HCC)    2. Diabetes mellitus type II, non insulin dependent (CMS HCC)    3. Anxiety    4. Depression, unspecified depression type    5. Obesity (BMI 30.0-34.9)        1. Chronic congestive heart failure likely systolic dysfunction, he was on Entresto metoprolol Aldactone at this time he is medical optimize will continue current medications  He may need future dose titration, the patient states that he would like to get off of his medication  Explained to him that this would make him high risk for recurrence, he agrees to be on the medications for now  Will get medical records [place] cardi[place]  Release was signed  At this time he is euvolemic he is on 20 mg of Lasix, asked him that this can be stopped  C/w GDMT    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 Hyperlipidemia, his ASVD score is greater than 20% offered him statin   he is  willing to take statin   start on 20mg  Lipitor   The 10-year ASCVD risk score (Arnett DK, et al., 2019) is: 22.9%    Values used to calculate the score:      Age: 76 years      Sex: Male      Is Non-Hispanic African American: No      Diabetic: Yes      Tobacco smoker: Yes      Systolic Blood Pressure: 129 mmHg      Is BP treated: No      HDL Cholesterol: 31 mg/dL      Total Cholesterol: 186 mg/dL    4. Snoring possible sleep apnea agree to sleep study, would send him for a home sleep study     5. Viagra 25mg  as needed  Orders:     Orders  Placed This Encounter    ECG 12-LEAD - ADD ON IN [facility] CLINICS ONLY    POLYSOMNOGRAPHY - SLEEP STUDY - UNATTENDED       Follow up:   f/u in 2 months       Patient discussed and seen with Dr. 44, MD, MS  PGY V Cardiology Fellow, Pager - [phone].    [facility]  Cardiology  [facility]        08/07/2022  I saw and examined the patient.  I reviewed the fellow's note.  I agree with the findings and plan of care as documented in the fellow's note.  Any exceptions/additions are edited/noted.    [id number] Bartunek, DO

## 2022-08-08 DIAGNOSIS — I509 Heart failure, unspecified: Secondary | ICD-10-CM

## 2022-08-08 LAB — ECG 12-LEAD
Atrial Rate: 71 {beats}/min
Calculated P Axis: 74 degrees
Calculated R Axis: 65 degrees
Calculated T Axis: 75 degrees
PR Interval: 156 ms
QRS Duration: 82 ms
QT Interval: 390 ms
QTC Calculation: 423 ms
Ventricular rate: 71 {beats}/min

## 2022-08-13 ENCOUNTER — Ambulatory Visit (HOSPITAL_BASED_OUTPATIENT_CLINIC_OR_DEPARTMENT_OTHER): Payer: Self-pay

## 2022-08-13 NOTE — Telephone Encounter (Signed)
Regarding: Leonard Dubin, MD// Sooner Colonoscopy Request  ----- Message from Velna Hatchet sent at 08/12/2022  3:01 PM EDT -----  Leonard Dubin, MD    Pt called in stating that he tried to schedule his colonoscopy with [facility] but couldn't get in until August 2024. Pt states that he needs to have this done sooner than that and would like PCP to help him if he can. Please advise.    Thanks,  Autumn Whisper [employer]

## 2022-08-14 ENCOUNTER — Encounter (HOSPITAL_COMMUNITY): Payer: Self-pay

## 2022-08-21 ENCOUNTER — Encounter (HOSPITAL_COMMUNITY): Payer: MEDICAID

## 2022-08-22 ENCOUNTER — Encounter (HOSPITAL_COMMUNITY): Payer: MEDICAID | Admitting: Student in an Organized Health Care Education/Training Program

## 2022-08-26 ENCOUNTER — Other Ambulatory Visit (HOSPITAL_BASED_OUTPATIENT_CLINIC_OR_DEPARTMENT_OTHER): Payer: Self-pay

## 2022-08-26 ENCOUNTER — Other Ambulatory Visit: Payer: Self-pay

## 2022-08-26 ENCOUNTER — Other Ambulatory Visit (HOSPITAL_BASED_OUTPATIENT_CLINIC_OR_DEPARTMENT_OTHER): Payer: Self-pay | Admitting: ORTHOPEDIC, SPORTS MEDICINE

## 2022-08-26 DIAGNOSIS — F32A Depression, unspecified: Secondary | ICD-10-CM

## 2022-08-26 DIAGNOSIS — F419 Anxiety disorder, unspecified: Secondary | ICD-10-CM

## 2022-08-26 DIAGNOSIS — F4310 Post-traumatic stress disorder, unspecified: Secondary | ICD-10-CM

## 2022-08-27 ENCOUNTER — Other Ambulatory Visit: Payer: Self-pay

## 2022-08-27 MED ORDER — SPIRONOLACTONE 25 MG TABLET
25.0000 mg | ORAL_TABLET | Freq: Every morning | ORAL | 3 refills | Status: DC
Start: 2022-08-27 — End: 2023-03-06
  Filled 2022-08-27: qty 90, 90d supply, fill #0
  Filled 2022-11-25: qty 90, 90d supply, fill #1
  Filled 2023-02-23: qty 90, 90d supply, fill #2

## 2022-08-27 MED ORDER — METFORMIN 500 MG TABLET
500.0000 mg | ORAL_TABLET | Freq: Every morning | ORAL | 3 refills | Status: DC
Start: 2022-08-27 — End: 2022-10-22
  Filled 2022-08-27: qty 90, 90d supply, fill #0

## 2022-08-27 MED ORDER — METOPROLOL SUCCINATE ER 25 MG TABLET,EXTENDED RELEASE 24 HR
25.0000 mg | ORAL_TABLET | Freq: Every day | ORAL | 3 refills | Status: DC
Start: 2022-08-27 — End: 2023-08-24
  Filled 2022-08-27: qty 90, 90d supply, fill #0
  Filled 2022-11-25: qty 90, 90d supply, fill #1
  Filled 2023-02-23: qty 90, 90d supply, fill #2
  Filled 2023-05-26: qty 90, 90d supply, fill #3

## 2022-08-28 ENCOUNTER — Other Ambulatory Visit (HOSPITAL_COMMUNITY): Payer: Self-pay | Admitting: Cardiovascular Disease

## 2022-08-28 ENCOUNTER — Other Ambulatory Visit: Payer: Self-pay

## 2022-08-28 ENCOUNTER — Other Ambulatory Visit (HOSPITAL_COMMUNITY): Payer: Self-pay | Admitting: Internal Medicine

## 2022-08-28 DIAGNOSIS — I509 Heart failure, unspecified: Secondary | ICD-10-CM

## 2022-08-28 DIAGNOSIS — E669 Obesity, unspecified: Secondary | ICD-10-CM

## 2022-08-28 DIAGNOSIS — E119 Type 2 diabetes mellitus without complications: Secondary | ICD-10-CM

## 2022-08-28 DIAGNOSIS — I5040 Unspecified combined systolic (congestive) and diastolic (congestive) heart failure: Secondary | ICD-10-CM

## 2022-08-28 MED ORDER — MELOXICAM 15 MG TABLET
15.0000 mg | ORAL_TABLET | Freq: Every day | ORAL | 5 refills | Status: DC
Start: 2022-08-28 — End: 2023-03-06
  Filled 2022-08-28 – 2022-09-16 (×2): qty 30, 30d supply, fill #0
  Filled 2022-10-10: qty 30, 30d supply, fill #1
  Filled 2022-11-11: qty 30, 30d supply, fill #2
  Filled 2022-12-14: qty 30, 30d supply, fill #3
  Filled 2023-01-14: qty 30, 30d supply, fill #4
  Filled 2023-02-11: qty 30, 30d supply, fill #5

## 2022-09-11 ENCOUNTER — Ambulatory Visit (INDEPENDENT_AMBULATORY_CARE_PROVIDER_SITE_OTHER): Payer: MEDICAID

## 2022-09-11 ENCOUNTER — Other Ambulatory Visit: Payer: Self-pay

## 2022-09-11 DIAGNOSIS — F419 Anxiety disorder, unspecified: Secondary | ICD-10-CM

## 2022-09-11 DIAGNOSIS — F32A Depression, unspecified: Secondary | ICD-10-CM

## 2022-09-11 DIAGNOSIS — F1590 Other stimulant use, unspecified, uncomplicated: Secondary | ICD-10-CM

## 2022-09-11 DIAGNOSIS — F1290 Cannabis use, unspecified, uncomplicated: Secondary | ICD-10-CM

## 2022-09-11 DIAGNOSIS — F1021 Alcohol dependence, in remission: Secondary | ICD-10-CM

## 2022-09-11 DIAGNOSIS — F1490 Cocaine use, unspecified, uncomplicated: Secondary | ICD-10-CM

## 2022-09-15 NOTE — Progress Notes (Signed)
[facility]   [facility]   Outpatient Progress Note    Client: Leonard [name]  Medical Record Number: [id number]  Payor Source: Payor: [employer]ia [employer] - [place] / Plan: [employer] - [place] / Product Type: Medicaid MC /   Date: 09/11/2022   CPT Code: 14782  SESSION Information      Start Time: 3:58  End Time: 4:30    Subjective:   Leonard [name] presents today for an individual therapy session to continue addressing ongoing struggles related to remaining sober after inpatient rehab. Current symptoms include some anxiety and difficulty falling asleep. 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 He states he is 140 days sober which is the longest time he has remained sober in decades. He states he is using meditation and relaxation resources provided by his recovery program. He states he is moving to [place] to seek work and live with his girlfriend and wont be needing to return for therapy. He states he attends and NA meeting once a week.    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 Sharmon Revere)  Rule out PTSD from childhood and potential prison exposures (Per Dr. Sharmon Revere)     Procedures:    Client attended an individual therapy session. Therapy included applying methods based on Acceptance and Commitment Therapy (ACT) for increasing  psychological flexibility and thus improving adaptive coping and developing and using coping skills appropriate for maintaining sobriety    Goals:   To effectively manage ongoing sobriety by utilizing appropriate coping strategies    PLAN:    Client terminated therapy as he is moving to a [place] to seek work and states he does not need to continue therapy in order to maintain sobriety. He is invited to call for an appointment if he feels he needs one in the future and potential benefits of therapy with respect to traumatic experiences are discussed should he ever decide to see help with potential childhood or prison related trauma.      Rosalyn Gess Greig Castilla, MA, NCC, Provisionally Licensed Counselor  Clinical Therapist  [place] [facility] Psych[facility]  09/15/2022, 16:54  Late Entry for: 09/11/2022    I reviewed the note with the therapist and agree with the therapeutic intervention and plan as documented.  Estanislado Spire, LPC, ALPS, AADC

## 2022-09-16 ENCOUNTER — Other Ambulatory Visit: Payer: Self-pay

## 2022-09-17 ENCOUNTER — Other Ambulatory Visit: Payer: Self-pay

## 2022-09-17 ENCOUNTER — Other Ambulatory Visit (HOSPITAL_BASED_OUTPATIENT_CLINIC_OR_DEPARTMENT_OTHER): Payer: Self-pay

## 2022-09-17 ENCOUNTER — Ambulatory Visit (HOSPITAL_BASED_OUTPATIENT_CLINIC_OR_DEPARTMENT_OTHER): Payer: Self-pay

## 2022-09-17 ENCOUNTER — Other Ambulatory Visit (HOSPITAL_COMMUNITY): Payer: Self-pay | Admitting: Hospitalist

## 2022-09-17 DIAGNOSIS — Z1211 Encounter for screening for malignant neoplasm of colon: Secondary | ICD-10-CM

## 2022-09-17 NOTE — Telephone Encounter (Signed)
Regarding: [place] pt // colonoscopy order  ----- Message from Ronni Rumble sent at 09/16/2022  4:29 PM EST -----  Titus Dubin, MD    Pt would like to request an order for a colonoscopy.    Thanks,  Marinell Blight [place]

## 2022-09-25 ENCOUNTER — Other Ambulatory Visit (HOSPITAL_BASED_OUTPATIENT_CLINIC_OR_DEPARTMENT_OTHER): Payer: Self-pay

## 2022-09-25 ENCOUNTER — Other Ambulatory Visit: Payer: Self-pay

## 2022-09-25 MED ORDER — ENTRESTO 49 MG-51 MG TABLET
1.0000 | ORAL_TABLET | Freq: Two times a day (BID) | ORAL | 3 refills | Status: DC
Start: 2022-09-25 — End: 2023-03-06
  Filled 2022-09-25: qty 180, 90d supply, fill #0
  Filled 2022-12-26: qty 180, 90d supply, fill #1

## 2022-09-25 NOTE — Telephone Encounter (Signed)
Regarding: Rx refill  ----- Message from Ladean Raya sent at 09/25/2022  1:29 PM EST -----    Titus Dubin, MD  Pt is requesting a refill on the medication below      sacubitriL-valsartan (ENTRESTO) 49-51 mg Oral Tablet 60 Tablet 1 06/12/2022 -   Sig - Route: Take 1 Tablet by mouth Twice daily - Oral   Class: Print         Preferred Pharmacy     [facility]    [address] [place] [address]    Phone: [phone] Fax: [phone]    Hours: Monday-Friday 8AM-8PM, Saturday & Sunday 8AM-6PM      Thank You Rollene Fare

## 2022-09-28 ENCOUNTER — Other Ambulatory Visit: Payer: Self-pay

## 2022-09-28 DIAGNOSIS — F4310 Post-traumatic stress disorder, unspecified: Secondary | ICD-10-CM

## 2022-09-28 DIAGNOSIS — F32A Depression, unspecified: Secondary | ICD-10-CM

## 2022-09-28 DIAGNOSIS — F419 Anxiety disorder, unspecified: Secondary | ICD-10-CM

## 2022-09-29 ENCOUNTER — Other Ambulatory Visit: Payer: Self-pay

## 2022-09-30 ENCOUNTER — Other Ambulatory Visit: Payer: Self-pay

## 2022-10-09 ENCOUNTER — Encounter (HOSPITAL_COMMUNITY): Payer: Self-pay

## 2022-10-10 ENCOUNTER — Other Ambulatory Visit: Payer: Self-pay

## 2022-10-10 ENCOUNTER — Ambulatory Visit: Payer: MEDICAID | Attending: Orthopaedic Surgery | Admitting: Orthopaedic Surgery

## 2022-10-10 ENCOUNTER — Encounter (HOSPITAL_BASED_OUTPATIENT_CLINIC_OR_DEPARTMENT_OTHER): Payer: Self-pay | Admitting: Orthopaedic Surgery

## 2022-10-10 VITALS — Ht 70.79 in | Wt 232.4 lb

## 2022-10-10 DIAGNOSIS — M1711 Unilateral primary osteoarthritis, right knee: Secondary | ICD-10-CM | POA: Insufficient documentation

## 2022-10-10 DIAGNOSIS — M25552 Pain in left hip: Secondary | ICD-10-CM | POA: Insufficient documentation

## 2022-10-10 DIAGNOSIS — M1612 Unilateral primary osteoarthritis, left hip: Secondary | ICD-10-CM | POA: Insufficient documentation

## 2022-10-10 DIAGNOSIS — M160 Bilateral primary osteoarthritis of hip: Secondary | ICD-10-CM | POA: Insufficient documentation

## 2022-10-10 NOTE — Progress Notes (Signed)
[facility]    Chief Complaint:    Chief Complaint   Patient presents with    Knee Pain    Hip Pain        HPI:  This is a 51 y.o. year old patient who presents with a Several month history of left hip pain, instability, and stiffness.  Pain is located in the anterior, medial hip.    The pain limits the ability to walk, exercise, perform house work, and perform yard work.   he has tried Prescription NSAIDs and Intra-articular corticosteroid injection.   he rates the pain a 7/10 at its very worst.  Pain is worse with walking and weight bearing and relieved with rest.  Associated symptoms include locking, catching, clicking, popping, stiffness, nighttime pain, and giving way (instability).    PMH:  Past Medical History:   Diagnosis Date    Anxiety     Depression     Diabetes mellitus type II, non insulin dependent (CMS HCC)     Diverticulosis of colon     GERD (gastroesophageal reflux disease)     Heart failure (CMS HCC)     Ritalin use disorder, severe (CMS HCC)     Severe alcohol use disorder (CMS HCC)     Severe benzodiazepine use disorder (CMS HCC)          PSH:  Past Surgical History:   Procedure Laterality Date    INGUINAL HERNIA REPAIR  1990         Medications:    Current Outpatient Medications:     atorvastatin (LIPITOR) 20 mg Oral Tablet, Take 1 Tablet (20 mg total) by mouth Once a day, Disp: 90 Tablet, Rfl: 4    DULoxetine (CYMBALTA DR) 60 mg Oral Capsule, Delayed Release(E.C.), Take 1 Capsule (60 mg total) by mouth Once a day, Disp: 90 Capsule, Rfl: 1    folic acid (FOLVITE) 1 mg Oral Tablet, Take 1 Tablet (1 mg total) by mouth Once a day, Disp: 30 Tablet, Rfl: 1    lidocaine 4 % Adhesive Patch, Medicated, Place 2 Patches on the skin Once a day to the left hip, Disp: 60 Patch, Rfl: 2    melatonin 3 mg Oral Tablet, Take 2 Tablets (6 mg total) by mouth Every night, Disp: 60 Tablet, Rfl: 2    meloxicam (MOBIC) 15 mg Oral Tablet, Take 1 Tablet (15 mg total) by mouth Once a day,  Disp: 30 Tablet, Rfl: 5    metFORMIN (GLUCOPHAGE) 500 mg Oral Tablet, Take 1 Tablet (500 mg total) by mouth Every morning with breakfast, Disp: 90 Tablet, Rfl: 3    metoprolol succinate (TOPROL-XL) 25 mg Oral Tablet Sustained Release 24 hr, Take 1 Tablet (25 mg total) by mouth Once a day, Disp: 90 Tablet, Rfl: 3    naloxone (KLOXXADO) 8 mg/actuation Nasal Spray, Non-Aerosol, 0.1 mL (8 mg total) by nasal (alternating) route Every 2 minutes as needed for actual or suspected opioid overdose. Alternate nostrils if needing more than one dose. Call 911 if used., Disp: 4 Each, Rfl: 11    nicotine (NICODERM CQ) 21 mg/24 hr Transdermal Patch 24 hr, Place 1 Patch (21 mg total) on the skin Once a day Indications: stop smoking, Disp: 28 Patch, Rfl: 2    nicotine polacrilex (NICORETTE) 4 mg Buccal Gum, Take 1 Each (4 mg total) by mouth Every 2 hours as needed for Nicotine withdrawal Indications: stop smoking, Disp: 100 Each, Rfl: 5    pantoprazole (PROTONIX)  40 mg Oral Tablet, Delayed Release (E.C.), Take 1 Tablet (40 mg total) by mouth Once a day, Disp: 30 Tablet, Rfl: 2    sacubitriL-valsartan (ENTRESTO) 49-51 mg Oral Tablet, Take 1 Tablet by mouth Twice daily, Disp: 180 Tablet, Rfl: 3    sennosides-docusate sodium (SENOKOT-S) 8.6-50 mg Oral Tablet, Take 1 Tablet by mouth Twice per day as needed (Constipation), Disp: 60 Tablet, Rfl: 2    Sildenafil (VIAGRA) 25 mg Oral Tablet, Take 1 Tablet (25 mg total) by mouth Every 24 hours as needed (1 hour before intercourse), Disp: 24 Tablet, Rfl: 0    spironolactone (ALDACTONE) 25 mg Oral Tablet, Take 1 Tablet (25 mg total) by mouth Every morning with breakfast, Disp: 90 Tablet, Rfl: 3    traZODone (DESYREL) 100 mg Oral Tablet, Take 1 Tablet (100 mg total) by mouth Every night, Disp: 90 Tablet, Rfl: 1    Allergy:  No Known Allergies    Family History:    Family Medical History:       Problem Relation (Age of Onset)    Alzheimer's/Dementia Mother    Breast Cancer Maternal Grandmother     Cancer Maternal Grandmother, Maternal Grandfather, Paternal Grandfather    Congestive Heart Failure Father    No Known Problems Brother            Denies a family history of a hypercoagulable condition      Social History:    Occupation:  Medically disabled  Last worked:  several months ago  Marital status:  Single  Patient resides:   with brother  Current tobacco use:  1/2 ppd  Current alcohol use:  None  Recreational drug use:  None                                                                                                                                                                                      Review of Systems:  The ROS is negative for fevers, chills, and sweats  Denies recent hospitalization and ER visits  The remainder of the review of systems is negative    Exam:  Ht 1.798 m (5' 10.79")   Wt 105 kg (232 lb 5.8 oz)   BMI 32.60 kg/m       Appearance:  well nourished, well developed  HEENT is Head: Normocephalic, no lesions, without obvious abnormality.  Breathing is Normal/Unlabored  Gait:  Stable without assistive device and antalgic  Deformity:  neutral  ROM of the left hip:  limited internal and external rotation  Tenderness:  anterior and medial  left hip  Medial right knee tenderness  Right knee Range of motion: 0-120  Neuro:  normal  Vascular:  present 2+  Reflexes:  normal  Skin:  color normal, vascularity normal, no evidence of bleeding or bruising, no lesions noted, no edema, temperature normal, texture normal, mobility and turgor normal, and nails normal without clubbing      X-rays:  Today's images of the left hip reveal severe DJD as evidenced by joint space narrowing, subchondral cysts and subchondral sclerosis. There is moderate to severe R hip DJD with joint space narrowing, subchondral cysts and subchondral sclerosis.     Images of the right knee reveals severe medial compartment DJD as evidenced by joint space narrowing and subchondral sclerosis. The L knee is relatively  spared.     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 Left hip pain  M25.552       3. Primary osteoarthritis of right knee  M17.11            Plan:  Treatment options were reviewed with the patient  Discussed with the patient that their left Hip arthritis will likely require total joint replacement at some point in their future.   The patient has tried and failed tylenol, ibuprofen, corticosteroid injection, and meloxicam .   He has given up much of their usual activity due to the arthritis pain and stiffness.   Nothing short of a left total Hip replacement will relieve pain and improve function.   Patient would like a repeat left hip corticosteroid injection, as these have been beneficial in the past. This is ordered to be completed by INTERVENTIONAL RADIOLOGY (see order).  Eventual R hip and R knee replacement for severe medial compartment DJD.  We reviewed details of the surgery, risks, and expectations for recovery.   We will begin the scheduling process.  OMOP clearance is required. The patient was given a joint book today.   We will see him back for their pre operative visit.  We discussed post discharge care and disposition.  Ample time was given to answer questions.    The patient was instructed to call or MyChart message with any additional concerns or questions.    he will follow up at the pre op H & P   he  was encouraged to call with questions or concerns.      A shared visit was performed for this patient with the co-signing physician.  Veatrice Kells, PA-C  10/10/2022, 13:58      L hip and R knee DJD are severe - Ton[place] 3 L hip and KL 4 R knee  R hip DJD is moderate to severe [place] 2  Will try another L hip corticosteroid injection   Eventual L total hip replacement when cleared for surgery  Due to prior heavy ETOH use, osteonecrosis of the hip is a possible diagnosis but this would be a subtle case  The patient was seen in tandem with the APP and these are  my findings:  Primary osteoarthritis of both hips [M16.0]   I personally saw and examined the patient. I reviewed imaging. The majority of my encounter was spent in medical decision making. See physician's assistant note for additional details.     Jeanell Sparrow, MD  [phone]      Jae Dire, MD  [address]  [place],  [place] [address]    Hermenia Fiscal, MD  [address]  [place] [address]

## 2022-10-15 ENCOUNTER — Other Ambulatory Visit: Payer: Self-pay

## 2022-10-16 NOTE — H&P (Signed)
[facility]  Orthopaedic Medical Optimization Program  Consult       Leonard [name], Leonard [name], 51 y.o. male Surgeon: Eliott Nine, MD   Date of Birth:  January 22, 1971 Date of Service:  10/22/2022    Medical Record Number: [id number] Preferred pharmacy:       Problem List Items Addressed This Visit    None  Visit Diagnoses       Preop testing    -  Primary    Relevant Orders    CBC/DIFF    PT/INR    COMPREHENSIVE METABOLIC PANEL, NON-FASTING    DRUG SCREEN, WITH CONFIRMATION, URINE    Nicotine And Cotinine, Serum/Plasma    HGA1C (HEMOGLOBIN A1C WITH EST AVG GLUCOSE)    Pain in extremity, unspecified extremity        Relevant Orders    CBC/DIFF    PT/INR    COMPREHENSIVE METABOLIC PANEL, NON-FASTING    DRUG SCREEN, WITH CONFIRMATION, URINE    Nicotine And Cotinine, Serum/Plasma    Substance abuse (CMS HCC)        Relevant Orders    CBC/DIFF    PT/INR    COMPREHENSIVE METABOLIC PANEL, NON-FASTING    DRUG SCREEN, WITH CONFIRMATION, URINE    Nicotine And Cotinine, Serum/Plasma    ETHANOL, SERUM/PLASMA    Tobacco abuse        Relevant Orders    CBC/DIFF    PT/INR    COMPREHENSIVE METABOLIC PANEL, NON-FASTING    DRUG SCREEN, WITH CONFIRMATION, URINE    Nicotine And Cotinine, Serum/Plasma    Poor dentition        Relevant Orders    Refer to Pediatric/Adult [facility]           Left hip DJD  - Patient has failed conservative therapy, operative management is planned. L THA  - Pre-operative labs ordered, pending  - EKG reviewed from 08/07/22 by this provider and demonstrates NSR  - Patient is without risk factors for excessive bleeding or blood clots.  - Pain management as per surgical team. Continue conservative symptom relief until operative management.    Special considerations:   Injection 06/25/22. Has another order for injection upcoming via IR  No NSAIDs    CKD3a  -avoid NSAIDs    EtOH abuse disorder  -[facility] 05/16/22-06/13/22  -Most recent appointment with psych therapist  09/11/22    History of polysubstance abuse  -Xanax, Ritalin, previously cocaine, psilocybin, cannabis  -urine drug screen pending    Tobacco abuse disorder  -smokes 0.5ppd x25 years  -has not been using any cessation products. He is going to see PCP about cessation products.  -Nicotine testing pending. 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 Sela Hilding ([facility] 08/07/22)  -TTE 06/26/22 EF 54%, normal diastolic function. Reports EF previously 30% when living in [place]. Denies history of ischemic evaluation  -Continue metoprolol succinate  -Hold Entresto, Spironolactone day of surgery  -Messaged cardiology to see if any further testing needed pre-op    Ruptured diverticulum  -s/p partial colectomy and ostomy about 2018. S/p ostomy reversal  -no known acute issues    Hypertension  -Continue metoprolol succinate  -Hold Entresto, Spironolactone day of surgery    Dyslipidemia  -has self discontinued Lipitor and trying diet    Prediabetes  -A1c 5.6% in 06/2022. Repeat  pending  -PCP recently stopped metformin. Trying to manage with diet    Abnormal fecal DNA/Cologuard test  -Screening test, no symptoms. Colonoscopy planned 06/19/23    Anxiety  Depression  -Continue Cymbalta    Insomnia  -continue trazodone    Suspected OSA  -Sleep study planned 10/24/22    Erectile dysfunction  -hold Viagra 3 days pre-op    Poor dentition  -dental clearance form given  -referral to [facility] and other local resources given    Pre-operative examination  - Patient is at acceptable risk of perioperative complications for the planned moderate risk surgery pending lab results, EKG, and below clearances.   - Clearances pending: labs, EKG, smoking, substances, Cardiology, Dental    Orders Placed This Encounter    CBC/DIFF    PT/INR    COMPREHENSIVE METABOLIC PANEL, NON-FASTING    DRUG SCREEN, WITH CONFIRMATION, URINE     Nicotine And Cotinine, Serum/Plasma    HGA1C (HEMOGLOBIN A1C WITH EST AVG GLUCOSE)    ETHANOL, SERUM/PLASMA    Refer to Pediatric/Adult [facility]       HPI:  Chanetta MarshallMatthew [name] is a 51 y.o. male that presents with need for pre-operative medical  optimization. He has a history of Left hip DJD requiring Left THA.     He reports a 1 year history of worsening left hip pain. He has difficulty with stairs. 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 Lidocaine patch helps some. He has not yet been called regarding his joint injection scheduling.    He has a history of polysubstance abuse and was in rehab 7-06/2022 and denies any uses of substances (EtOH, THC, cocaine) for last 6 months.    Patient reports additional history of polysubstance abuse, CHF, HTN, HLD, diverticulitis, anxiety, depression, OSA.    Patient denies any fever, chills, dizziness, lightheadedness, chest pain, SOB, abdominal pain, nausea, vomiting, constipation, diarrhea, or trouble/pain with urination.    Patient denies chest pain, shortness of breath at rest, dyspnea on exertion, pre-syncope/syncope, palpitations, orthopnea, or PND  Patient denies previous DVT or family history of DVT.   Sleep study pending.  Patient reports any ongoing dental issues. Has not seen dentist in 20 years.  Patient denies any previous problems with anesthesia.   Patient denies any family history of problems with anesthesia.    METS Metabolic Equivalents:  >4      History of MRSA?   No   Significant allergies to metal/nickel, latex, acrylic or iodine   No     A diagnosis of COVID-19 in the past 90 days?   No     Personal or family history of bleeding/clotting disorders:     No     History of a prosthetic joint infection/septic joint:      No     History of MI/CVA/cardiac stents/cardiac surgeries:      No     Pacemaker, ICD, or implanted device?     No     Last steroid injection in operative site:      06/25/22, another with IR pending         Social History     Tobacco Use    Smoking status: Every Day     Packs/day: .5     Types: Cigarettes    Smokeless tobacco: Never   Substance Use Topics    Alcohol use: Not Currently  Comment: Drinking from 1 pint to 1/5 liquor per day    Drug use: Not Currently     Frequency: 3.0 times per week     Types: Cocaine     Comment: 3 times per week. not used for couple months 05/16/2022       Current medications:  Outpatient Medications Marked as Taking for the 10/22/22 encounter (Office Visit) with Diamantina Providence, MD   Medication Sig    DULoxetine (CYMBALTA DR) 60 mg Oral Capsule, Delayed Release(E.C.) Take 1 Capsule (60 mg total) by mouth Once a day    lidocaine 4 % Adhesive Patch, Medicated Place 2 Patches on the skin Once a day to the left hip    meloxicam (MOBIC) 15 mg Oral Tablet Take 1 Tablet (15 mg total) by mouth Once a day    metoprolol succinate (TOPROL-XL) 25 mg Oral Tablet Sustained Release 24 hr Take 1 Tablet (25 mg total) by mouth Once a day    naloxone (KLOXXADO) 8 mg/actuation Nasal Spray, Non-Aerosol 0.1 mL (8 mg total) by nasal (alternating) route Every 2 minutes as needed for actual or suspected opioid overdose. Alternate nostrils if needing more than one dose. Call 911 if used.    sacubitriL-valsartan (ENTRESTO) 49-51 mg Oral Tablet Take 1 Tablet by mouth Twice daily    Sildenafil (VIAGRA) 25 mg Oral Tablet Take 1 Tablet (25 mg total) by mouth Every 24 hours as needed (1 hour before intercourse)    spironolactone (ALDACTONE) 25 mg Oral Tablet Take 1 Tablet (25 mg total) by mouth Every morning with breakfast    traZODone (DESYREL) 100 mg Oral Tablet Take 1 Tablet (100 mg total) by mouth Every night       Review of Systems   Constitutional:  Negative for activity change, appetite change, fatigue and unexpected weight change.   HENT:  Positive for dental problem. Negative for congestion, sinus pain, sore throat and trouble swallowing.    Eyes:  Negative  for discharge and visual disturbance.   Respiratory:  Negative for cough, chest tightness, shortness of breath and wheezing.    Cardiovascular:  Negative for chest pain, palpitations and leg swelling.   Gastrointestinal:  Negative for abdominal pain, constipation, diarrhea, nausea and vomiting.   Genitourinary:  Negative for difficulty urinating, dysuria, frequency and urgency.   Musculoskeletal:  Positive for arthralgias and gait problem. Negative for neck pain.   Skin:  Negative for rash and wound.   Neurological:  Negative for dizziness, syncope, light-headedness and headaches.   Hematological:  Does not bruise/bleed easily.   Psychiatric/Behavioral:  Negative for behavioral problems.          Physical Exam  Vitals reviewed.   Constitutional:       General: He is not in acute distress.     Appearance: Normal appearance. He is not ill-appearing.   HENT:      Head: Normocephalic and atraumatic.      Mouth/Throat:      Mouth: Mucous membranes are moist.      Comments: Numerous broken teeth with large caries, erosions  Eyes:      Extraocular Movements: Extraocular movements intact.      Conjunctiva/sclera: Conjunctivae normal.   Neck:      Vascular: No carotid bruit.   Cardiovascular:      Rate and Rhythm: Normal rate and regular rhythm.      Pulses: Normal pulses.      Heart sounds: Normal heart sounds. No murmur heard.  Pulmonary:  Effort: Pulmonary effort is normal.      Breath sounds: Normal breath sounds. No wheezing or rhonchi.   Abdominal:      General: Bowel sounds are normal. There is no distension.      Palpations: Abdomen is soft.      Tenderness: There is no abdominal tenderness.   Musculoskeletal:         General: No swelling or tenderness.      Cervical back: Normal range of motion and neck supple.      Right lower leg: No edema.      Left lower leg: No edema.   Skin:     General: Skin is warm and dry.      Findings: No bruising or lesion.   Neurological:      General: No focal deficit present.       Mental Status: He is alert and oriented to person, place, and time.      Motor: No weakness.   Psychiatric:         Mood and Affect: Mood normal.         Behavior: Behavior normal.        Vitals:    10/22/22 1459   BP: 119/75   Pulse: 78   Temp: 36 C (96.8 F)   Weight: 104 kg (229 lb 4.5 oz)   Height: 1.8 m (5' 10.87")   BMI: 32.17           ASA 3 - Patient with moderate systemic disease with functional limitations  > 4 METs - Climbing a flight of stairs    Goldman Risk Evaluation  history of heart failure                                                                        1 point  The risk of major cardiac complications varies according to the number of risk factors. If one includes only cardiac death, nonfatal MI, and nonfatal cardiac arrest as major cardiac events in the [place] cohort, the following rates of adverse outcomes were seen:  No risk factors -- 0.4 percent (95% CI 0.1-0.8 percent)   One risk factor -- 1.0 percent (95% CI 0.5-1.4 percent)   Two risk factors -- 2.4 percent (95% CI 1.3-3.5 percent)   Three or more risk factors -- 5.4 percent (95% CI 2.8-7.9 percent)  *Perioperative cardiac events in patients undergoing noncardiac surgery: a review of the magnitude of the problem, the pathophysiology of the events and methods to estimate and communicate risk.Devereaux PJ, Lula Olszewski [place]. 2005;173(6):627.    Coding:   MDM  Reviewed: previous chart, nursing note and vitals  Reviewed previous: x-ray, ECG and labs  Interpretation: labs, ECG and x-ray  Consults: orthopedics, cardiology and primary care provider       * I reviewed the documentation and progress notes from the above consults listed.     Labs reviewed:  A1C: 5.6  A1C Date: 06/26/2022  COMPLETE BLOOD COUNT   Lab Results   Component Value Date    WBC 11.7 (H) 06/26/2022    HGB 11.8 (L) 06/26/2022    HCT 35.3 (L) 06/26/2022    PLTCNT 275 06/26/2022  DIFFERENTIAL  Lab Results   Component Value Date     PMNS 80 06/26/2022    MONOCYTES 5 06/26/2022    BASOPHILS 0 06/26/2022    BASOPHILS <0.10 06/26/2022    PMNABS 9.36 (H) 06/26/2022    LYMPHSABS 1.64 06/26/2022    EOSABS <0.10 06/26/2022    MONOSABS 0.57 06/26/2022     COMPREHENSIVE METABOLIC PANEL FASTING  Lab Results   Component Value Date    SODIUM 137 07/10/2022    POTASSIUM 4.6 07/10/2022    CHLORIDE 105 07/10/2022    CO2 24 07/10/2022    ANIONGAP 8 07/10/2022    BUN 19 07/10/2022    CREATININE 1.18 07/10/2022    CALCIUM 10.2 (H) 07/10/2022    ALBUMIN 4.0 06/26/2022    TOTALPROTEIN 8.4 (H) 06/26/2022    ALKPHOS 65 06/26/2022    AST 9 06/26/2022    ALT 13 06/26/2022     PROTHROMBIN TIME and INR  No results found for: "PROTHROMTME", "INR"    L HIP 06/25/22  IMPRESSION:  Mild to moderate bilateral hip degenerative arthrosis.    Abbie Sons, MD, MPH  10/22/2022, 11:36  Orthopaedic Medical Optimization Program  Dept of Orthopaedics

## 2022-10-22 ENCOUNTER — Other Ambulatory Visit: Payer: Self-pay

## 2022-10-22 ENCOUNTER — Telehealth (HOSPITAL_BASED_OUTPATIENT_CLINIC_OR_DEPARTMENT_OTHER): Payer: Self-pay

## 2022-10-22 ENCOUNTER — Ambulatory Visit: Payer: MEDICAID | Attending: Internal Medicine | Admitting: Internal Medicine

## 2022-10-22 ENCOUNTER — Other Ambulatory Visit (HOSPITAL_BASED_OUTPATIENT_CLINIC_OR_DEPARTMENT_OTHER): Payer: MEDICAID

## 2022-10-22 ENCOUNTER — Encounter (HOSPITAL_BASED_OUTPATIENT_CLINIC_OR_DEPARTMENT_OTHER): Payer: Self-pay | Admitting: Internal Medicine

## 2022-10-22 VITALS — BP 119/75 | HR 78 | Temp 96.8°F | Ht 70.87 in | Wt 229.3 lb

## 2022-10-22 DIAGNOSIS — Z01818 Encounter for other preprocedural examination: Secondary | ICD-10-CM | POA: Insufficient documentation

## 2022-10-22 DIAGNOSIS — F32A Depression, unspecified: Secondary | ICD-10-CM | POA: Insufficient documentation

## 2022-10-22 DIAGNOSIS — F1910 Other psychoactive substance abuse, uncomplicated: Secondary | ICD-10-CM

## 2022-10-22 DIAGNOSIS — I10 Essential (primary) hypertension: Secondary | ICD-10-CM | POA: Insufficient documentation

## 2022-10-22 DIAGNOSIS — R7303 Prediabetes: Secondary | ICD-10-CM | POA: Insufficient documentation

## 2022-10-22 DIAGNOSIS — G4700 Insomnia, unspecified: Secondary | ICD-10-CM

## 2022-10-22 DIAGNOSIS — M1612 Unilateral primary osteoarthritis, left hip: Secondary | ICD-10-CM | POA: Insufficient documentation

## 2022-10-22 DIAGNOSIS — F17210 Nicotine dependence, cigarettes, uncomplicated: Secondary | ICD-10-CM | POA: Insufficient documentation

## 2022-10-22 DIAGNOSIS — I5020 Unspecified systolic (congestive) heart failure: Secondary | ICD-10-CM | POA: Insufficient documentation

## 2022-10-22 DIAGNOSIS — N529 Male erectile dysfunction, unspecified: Secondary | ICD-10-CM

## 2022-10-22 DIAGNOSIS — I129 Hypertensive chronic kidney disease with stage 1 through stage 4 chronic kidney disease, or unspecified chronic kidney disease: Secondary | ICD-10-CM

## 2022-10-22 DIAGNOSIS — M79609 Pain in unspecified limb: Secondary | ICD-10-CM

## 2022-10-22 DIAGNOSIS — Z720 Tobacco use: Secondary | ICD-10-CM

## 2022-10-22 DIAGNOSIS — F419 Anxiety disorder, unspecified: Secondary | ICD-10-CM

## 2022-10-22 DIAGNOSIS — N1831 Chronic kidney disease, stage 3a (CMS HCC): Secondary | ICD-10-CM

## 2022-10-22 DIAGNOSIS — Z79899 Other long term (current) drug therapy: Secondary | ICD-10-CM | POA: Insufficient documentation

## 2022-10-22 DIAGNOSIS — F1911 Other psychoactive substance abuse, in remission: Secondary | ICD-10-CM | POA: Insufficient documentation

## 2022-10-22 DIAGNOSIS — K089 Disorder of teeth and supporting structures, unspecified: Secondary | ICD-10-CM | POA: Insufficient documentation

## 2022-10-22 DIAGNOSIS — E785 Hyperlipidemia, unspecified: Secondary | ICD-10-CM

## 2022-10-22 LAB — COMPREHENSIVE METABOLIC PANEL, NON-FASTING
ALBUMIN: 4.1 g/dL (ref 3.5–5.0)
ALKALINE PHOSPHATASE: 53 U/L (ref 45–115)
ALT (SGPT): 9 U/L — ABNORMAL LOW (ref 10–55)
ANION GAP: 10 mmol/L (ref 4–13)
AST (SGOT): 13 U/L (ref 8–45)
BILIRUBIN TOTAL: 0.2 mg/dL — ABNORMAL LOW (ref 0.3–1.3)
BUN/CREA RATIO: 22 (ref 6–22)
BUN: 31 mg/dL — ABNORMAL HIGH (ref 8–25)
CALCIUM: 9.8 mg/dL (ref 8.6–10.2)
CHLORIDE: 106 mmol/L (ref 96–111)
CO2 TOTAL: 26 mmol/L (ref 22–30)
CREATININE: 1.42 mg/dL — ABNORMAL HIGH (ref 0.75–1.35)
ESTIMATED GFR - MALE: 59 mL/min/BSA — ABNORMAL LOW (ref >=60)
GLUCOSE: 90 mg/dL (ref 65–125)
POTASSIUM: 5.1 mmol/L (ref 3.5–5.1)
PROTEIN TOTAL: 7.8 g/dL (ref 6.4–8.3)
SODIUM: 142 mmol/L (ref 136–145)

## 2022-10-22 LAB — CBC WITH DIFF
BASOPHIL #: <0.1 10*3/uL (ref <=0.20)
BASOPHIL %: 0.7 %
EOSINOPHIL #: 0.47 10*3/uL (ref <=0.50)
EOSINOPHIL %: 5 %
HCT: 40 % (ref 38.9–52.0)
HGB: 13.1 g/dL — ABNORMAL LOW (ref 13.4–17.5)
IMMATURE GRANULOCYTE #: <0.1 10*3/uL (ref <0.10)
IMMATURE GRANULOCYTE %: 0.5 % (ref 0.0–1.0)
LYMPHOCYTE #: 2.53 10*3/uL (ref 1.00–4.80)
LYMPHOCYTE %: 26.9 %
MCH: 30 pg (ref 26.0–32.0)
MCHC: 32.8 g/dL (ref 31.0–35.5)
MCV: 91.5 fL (ref 78.0–100.0)
MONOCYTE #: 0.63 10*3/uL (ref 0.20–1.10)
MONOCYTE %: 6.7 %
MPV: 10.9 fL (ref 8.7–12.5)
NEUTROPHIL #: 5.66 10*3/uL (ref 1.50–7.70)
NEUTROPHIL %: 60.2 %
PLATELETS: 196 10*3/uL (ref 150–400)
RBC: 4.37 10*6/uL — ABNORMAL LOW (ref 4.50–6.10)
RDW-CV: 13.9 % (ref 11.5–15.5)
WBC: 9.4 10*3/uL (ref 3.7–11.0)

## 2022-10-22 LAB — ETHANOL, SERUM/PLASMA
ETHANOL: <10 mg/dL (ref <10)
ETHANOL: NOT DETECTED

## 2022-10-22 LAB — HGA1C (HEMOGLOBIN A1C WITH EST AVG GLUCOSE)
ESTIMATED AVERAGE GLUCOSE: 105 mg/dL
HEMOGLOBIN A1C: 5.3 % (ref 4.0–5.6)

## 2022-10-22 LAB — DRUG SCREEN, WITH CONFIRMATION, URINE
AMPHETAMINES, URINE: NEGATIVE
BARBITURATES URINE: NEGATIVE
BENZODIAZEPINES URINE: NEGATIVE
BUPRENORPHINE URINE: NEGATIVE
CANNABINOIDS URINE: NEGATIVE
COCAINE METABOLITES URINE: NEGATIVE
CREATININE RANDOM URINE: 200 mg/dL — ABNORMAL HIGH (ref 50–100)
ECSTASY/MDMA URINE: NEGATIVE
FENTANYL, RANDOM URINE: NEGATIVE
METHADONE URINE: NEGATIVE
OPIATES URINE (LOW CUTOFF): NEGATIVE
OXYCODONE URINE: NEGATIVE

## 2022-10-22 LAB — PT/INR
INR: 1 (ref 0.80–1.20)
PROTHROMBIN TIME: 11.6 seconds (ref 9.1–13.9)

## 2022-10-22 NOTE — Telephone Encounter (Signed)
Faxed dental referral to [facility] dent[facility] school. Will follow up in 2 weeks  about dental and smoking

## 2022-10-23 ENCOUNTER — Telehealth (HOSPITAL_BASED_OUTPATIENT_CLINIC_OR_DEPARTMENT_OTHER): Payer: Self-pay

## 2022-10-23 NOTE — Telephone Encounter (Signed)
Called pt , he will need to make appt with cards . Pt has not been to cards in awhile. , pt know the number. Will follow up in 2 weeks to check if he has mad a dental appt and stop smoking

## 2022-10-24 ENCOUNTER — Other Ambulatory Visit (HOSPITAL_BASED_OUTPATIENT_CLINIC_OR_DEPARTMENT_OTHER): Payer: Self-pay | Admitting: Orthopaedic Surgery

## 2022-10-24 ENCOUNTER — Other Ambulatory Visit: Payer: Self-pay

## 2022-10-24 ENCOUNTER — Inpatient Hospital Stay
Admission: RE | Admit: 2022-10-24 | Discharge: 2022-10-24 | Disposition: A | Discharge status: Home / Self Care | Payer: MEDICAID | Source: Ambulatory Visit | Attending: Student in an Organized Health Care Education/Training Program | Admitting: Student in an Organized Health Care Education/Training Program

## 2022-10-24 DIAGNOSIS — G4733 Obstructive sleep apnea (adult) (pediatric): Secondary | ICD-10-CM

## 2022-10-24 DIAGNOSIS — E119 Type 2 diabetes mellitus without complications: Secondary | ICD-10-CM

## 2022-10-24 DIAGNOSIS — E669 Obesity, unspecified: Secondary | ICD-10-CM | POA: Insufficient documentation

## 2022-10-24 DIAGNOSIS — I509 Heart failure, unspecified: Secondary | ICD-10-CM

## 2022-10-24 DIAGNOSIS — M1612 Unilateral primary osteoarthritis, left hip: Secondary | ICD-10-CM

## 2022-10-25 ENCOUNTER — Inpatient Hospital Stay
Admission: RE | Admit: 2022-10-25 | Discharge: 2022-10-25 | Disposition: A | Discharge status: Home / Self Care | Payer: MEDICAID | Source: Ambulatory Visit | Attending: Orthopaedic Surgery | Admitting: Orthopaedic Surgery

## 2022-10-25 DIAGNOSIS — M1612 Unilateral primary osteoarthritis, left hip: Secondary | ICD-10-CM | POA: Insufficient documentation

## 2022-10-25 NOTE — Discharge Instructions (Signed)
Discharge Instructions following Hip Arthrogram    You may experience temporary numbness or weakness in your leg after the injection. When the numbing medication wears off, this should go away.  You should limit your activity and not drive for the remainder of the day.  You may experience a temporary increase in pain for the first 24-72 hours after the injection. This occurs because the local anesthetics were injected into an area where there is already inflammation. The anti-inflammatory effect from the steroid may take several days to take effect.   If the site of the injection is painful, you may apply an ice pack to the area to reduce the discomfort or take pain medication as directed by your physician.  You may resume all of your medications immediately following the injection, unless directed otherwise by your physician.   Please seek medical attention (PCP or local Emergency Dept.) if you experience fever, chills, redness or swelling at the injection site, increased pain, weakness or sensory changes or, rarely, changes in bladder and/or bowel function.    If it is indicated to call your physician, call [facility] at [phone], and ask the operator to page the Radiology Resident.  When calling after 4:00pm or on weekends, ask the operator to page the Radiology Resident On-Call.    If you have any further questions or concerns related to your post-procedure care, you may contact the Radiology Nurse Monday thru Friday, 7:00am-4:00pm at [phone].

## 2022-10-26 ENCOUNTER — Other Ambulatory Visit: Payer: Self-pay

## 2022-10-26 DIAGNOSIS — F32A Depression, unspecified: Secondary | ICD-10-CM

## 2022-10-26 DIAGNOSIS — F4310 Post-traumatic stress disorder, unspecified: Secondary | ICD-10-CM

## 2022-10-26 DIAGNOSIS — F419 Anxiety disorder, unspecified: Secondary | ICD-10-CM

## 2022-10-27 LAB — NICOTINE AND COTININE, SERUM/PLASMA
COTININE, SERUM/PLASMA: 109 ng/mL
NICOTINE, SERUM/PLASMA: 11 ng/mL

## 2022-10-27 MED ORDER — TRIAMCINOLONE ACETONIDE 40 MG/ML SUSPENSION FOR INJECTION
40.0000 mg | Freq: Once | INTRAMUSCULAR | Status: AC
Start: 2022-10-25 — End: 2022-10-25
  Administered 2022-10-25: 40 mg via INTRA_ARTICULAR

## 2022-10-27 MED ORDER — LIDOCAINE HCL 10 MG/ML (1 %) INJECTION SOLUTION
10.0000 mL | Freq: Once | INTRAMUSCULAR | Status: AC
Start: 2022-10-25 — End: 2022-10-25
  Administered 2022-10-25: 100 mg

## 2022-10-27 MED ORDER — ROPIVACAINE (PF) 5 MG/ML (0.5 %) INJECTION SOLUTION
4.0000 mL | Freq: Once | INTRAMUSCULAR | Status: AC
Start: 2022-10-25 — End: 2022-10-25
  Administered 2022-10-25: 4 mL via INTRA_ARTICULAR

## 2022-10-27 MED ORDER — IOPAMIDOL 300 MG IODINE/ML (61 %) INTRAVENOUS SOLUTION
1.0000 mL | INTRAVENOUS | Status: AC
Start: 2022-10-25 — End: 2022-10-25
  Administered 2022-10-25: 1 mL via INTRA_ARTICULAR

## 2022-10-28 DIAGNOSIS — M1612 Unilateral primary osteoarthritis, left hip: Secondary | ICD-10-CM

## 2022-10-29 ENCOUNTER — Telehealth (HOSPITAL_BASED_OUTPATIENT_CLINIC_OR_DEPARTMENT_OTHER): Payer: Self-pay

## 2022-10-29 NOTE — Telephone Encounter (Signed)
Called patient to let him know to schedule a cardiology appt. Patient will set up appointment for cardiac risk assessment.     Leitha Bleak, RN

## 2022-11-01 ENCOUNTER — Ambulatory Visit
Payer: MEDICAID | Attending: Student in an Organized Health Care Education/Training Program | Admitting: Student in an Organized Health Care Education/Training Program

## 2022-11-01 ENCOUNTER — Encounter (HOSPITAL_BASED_OUTPATIENT_CLINIC_OR_DEPARTMENT_OTHER): Payer: Self-pay | Admitting: Student in an Organized Health Care Education/Training Program

## 2022-11-01 ENCOUNTER — Other Ambulatory Visit: Payer: Self-pay

## 2022-11-01 VITALS — Temp 98.2°F | Ht 70.98 in | Wt 228.2 lb

## 2022-11-01 DIAGNOSIS — M1711 Unilateral primary osteoarthritis, right knee: Secondary | ICD-10-CM | POA: Insufficient documentation

## 2022-11-01 NOTE — Procedures (Signed)
JOINT REPLACEMENT, [name] [address]  [place] [address]  Operated by [facility]  Procedure Note    Name: Leonard [name] MRN:  [id number]   Date: 11/01/2022 Age: 51 y.o.  DOB:   Nov 28, 1970       Joint Asp/Inj    Performed by: Veatrice Kells, PA-C  Authorized by: Veatrice Kells, PA-C    Consent:     Consent obtained:  Verbal    Consent given by:  Patient    Risks discussed:  Bleeding and pain    Alternatives discussed:  No treatment  Universal protocol:     Patient identity confirmed:  Verbally with patient  Location:     Location:  Knee    Knee:  R knee  Anesthesia (see MAR for exact dosages):     Anesthesia method:  Topical application    Topical anesthetic:  Ethyl chloride spray  Procedure details:     Needle gauge:  22 G    Approach:  Lateral    Steroid injected: yes      Specimen collected: no    Post-procedure details:     Dressing:  Adhesive bandage    Patient tolerance of procedure:  Tolerated well, no immediate complications  Comments:      The pt was offered a corticosteroid injection and provided verbal consent.  The right knee was injected with 4cc's of 1% lidocaine and 40 mg depomedrol using sterile technique. The patient was advised that the injection site is frequently tender the evening of the injection and that on some occasions, the joint pain is worse after the injection for the first 24 to 48 hours.  The patient was advised to alternate between ice and heat, and to use OTC pain relievers for the pain until it subsides.  The patient was also advised to contact the office if the pain becomes severe and cannot be controlled with the above remedies.  The patient was warned about facial flushing and warmth after the injection and to use Benadryl if this occurs.  Finally the patient was advised that it may take up to 72 hours for the injection to reach peak effectiveness.  The patient was advised to contact this office for follow up if the  injection provided no significant pain relief.    Veatrice Kells, PA-C  11/01/2022, 10:55             Veatrice Kells, PA-C

## 2022-11-05 ENCOUNTER — Telehealth (HOSPITAL_BASED_OUTPATIENT_CLINIC_OR_DEPARTMENT_OTHER): Payer: Self-pay

## 2022-11-05 NOTE — Telephone Encounter (Signed)
Called patient to follow up on dental, cardiology, and smoking cessation. Patient has Cardiology appt on 11/13/2022. 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 Patient is currently smoking about 6 cigarettes a day. Will follow up with patient after appointments.     Leitha Bleak, RN

## 2022-11-07 NOTE — Progress Notes (Signed)
FAMILY MEDICINE, [facility]  [place] [address]  Operated by [place]  Return Patient Visit     Name: Leonard [name] MRN:  [id number]   Date: 11/08/2022 Age: 52 y.o.   PCP: Titus Dubin, MD    CHIEF COMPLAINT  No chief complaint on file.      Subjective    Leonard [name] is a 52 y.o. male who presents to clinic for return monitoring.     Patient has scheduled surgery coming up but is still smoking. Will need to be cigarette free for 8 weeks. Wanting to quit. Smoking roughly 6 cigarettes per week.     DM2 - well controlled. Currently not taking any medications. 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 He has remained sober. Most recent A1C of 5.3. Unclear of previous A1C while living in [place].     Lab Results   Component Value Date    HA1C 5.3 10/22/2022     Objective   There were no vitals taken for this visit.        General: Pleasant. Clean dressed and well groomed.   Eyes: Conjunctiva clear, pupils equal and round  HENT: AT/NC. External ears normal. Nasal mucosa normal. Oral mucosa moist and pink.  Neck: Supple, symmetrical, trachea midline  Lungs: Normal WOB on RA. Chest rise equal and symmetric  Cardiovascular: Appears well perfused   Abdomen: Nondistended   Extremities: Atraumatic, well-perfused   Skin: Skin warm and dry, No rashes, and No lesions  Neurologic: Alert and oriented. No tremor, no gross deficit   Psychiatric: Normal    Assessment    Leonard [name] is a 52 y.o. male presenting for return patient visit    (F17.200) Nicotine dependence  (primary encounter diagnosis)  Plan:   - Plan for Chantix  - Nicotine patch ordered.   - Patient agreeable with plan and motivated by ortho surgery.     (E11.9) Type II diabetes mellitus (CMS [place])  Plan:   - Chronic and controlled with diet.   - Last A1C in 12/23 was 5.3, likely with lifestyle changes he will be able to maintain this.   - Plan for OPTOS + microalbumin/creatine. Is  currently on Valsartan 51mg .           Titus Dubin, MD  PGY-2 Dept. Family Medicine  11/07/2022, 20:02    This document was at least partially generated using a voice recognition system and transcription. All documents are proofed as best as possible, but it may have misspelled words, incorrect words, or syntax and grammatical errors because of the imperfect nature of the system.

## 2022-11-08 ENCOUNTER — Other Ambulatory Visit: Payer: Self-pay

## 2022-11-08 ENCOUNTER — Encounter (HOSPITAL_BASED_OUTPATIENT_CLINIC_OR_DEPARTMENT_OTHER): Payer: Self-pay

## 2022-11-08 ENCOUNTER — Ambulatory Visit: Payer: MEDICAID | Attending: Family Medicine

## 2022-11-08 VITALS — BP 110/62 | HR 92 | Temp 96.9°F | Ht 71.0 in | Wt 229.5 lb

## 2022-11-08 DIAGNOSIS — F17200 Nicotine dependence, unspecified, uncomplicated: Secondary | ICD-10-CM | POA: Insufficient documentation

## 2022-11-08 DIAGNOSIS — E119 Type 2 diabetes mellitus without complications: Secondary | ICD-10-CM | POA: Insufficient documentation

## 2022-11-08 DIAGNOSIS — R5383 Other fatigue: Secondary | ICD-10-CM | POA: Insufficient documentation

## 2022-11-08 MED ORDER — VARENICLINE TARTRATE 0.5 MG (11)-1 MG (42) TABLETS IN A DOSE PACK
ORAL_TABLET | ORAL | 0 refills | Status: DC
Start: 2022-11-08 — End: 2023-01-16
  Filled 2022-11-08 – 2022-12-14 (×4): qty 53, 28d supply, fill #0

## 2022-11-08 NOTE — Patient Instructions (Signed)
Please obtain testosterone at 8am  - Chantix has been sent to your pharmacy, pick a quit date and start medication 7 days prior. Reach out to ortho regarding that status of quitting.

## 2022-11-11 ENCOUNTER — Other Ambulatory Visit: Payer: Self-pay

## 2022-11-11 LAB — DIABETIC RETINAL PHOTOS: DIABETIC RETINOPATHY Y/N: NEGATIVE

## 2022-11-12 ENCOUNTER — Other Ambulatory Visit: Payer: Self-pay

## 2022-11-13 ENCOUNTER — Other Ambulatory Visit: Payer: Self-pay

## 2022-11-13 ENCOUNTER — Ambulatory Visit: Payer: MEDICAID | Attending: Internal Medicine | Admitting: Internal Medicine

## 2022-11-13 ENCOUNTER — Telehealth (HOSPITAL_BASED_OUTPATIENT_CLINIC_OR_DEPARTMENT_OTHER): Payer: Self-pay

## 2022-11-13 ENCOUNTER — Encounter (HOSPITAL_COMMUNITY): Payer: Self-pay | Admitting: Internal Medicine

## 2022-11-13 VITALS — BP 126/85 | HR 84 | Temp 97.3°F | Ht 71.0 in | Wt 229.3 lb

## 2022-11-13 DIAGNOSIS — Z01810 Encounter for preprocedural cardiovascular examination: Secondary | ICD-10-CM

## 2022-11-13 DIAGNOSIS — E785 Hyperlipidemia, unspecified: Secondary | ICD-10-CM

## 2022-11-13 DIAGNOSIS — R0683 Snoring: Secondary | ICD-10-CM

## 2022-11-13 DIAGNOSIS — E119 Type 2 diabetes mellitus without complications: Secondary | ICD-10-CM | POA: Insufficient documentation

## 2022-11-13 DIAGNOSIS — I509 Heart failure, unspecified: Secondary | ICD-10-CM

## 2022-11-13 DIAGNOSIS — R7303 Prediabetes: Secondary | ICD-10-CM

## 2022-11-13 NOTE — Telephone Encounter (Signed)
[place] Tobacco Quit Line [phone]  [place] [employer] requires pt to enroll in smoking cessation   MyChart Message to pt to contact quit line     Klea Nall, LPN

## 2022-11-13 NOTE — Progress Notes (Signed)
Cardiology, [place]  [place] [address]  [phone]    Cardiology  Follow-up Patient Clinic Note    Name: Leonard [name]   DOB: [birth year]  [52 y.o. male]   MRN: [id number]       Visit Date: 11/13/2022   Referring: Jacqualine Code, MD  [place]  [place],  [place] [address]   PCP: Titus Dubin, MD       Reason For Visit:  Initial office visit with cardiology.  Chief Complaint: Follow Up    Information provided by: patient      History of Present Illness    Leonard [name] is a 52 y.o. White male who presents as a follow-up. Patient was sent over by PCP, prior history of nonischemic cardiomyopathy EF unknown but less than 40% per patient he was seen at [place] at [place], Dr. 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  He also has history of pre diabetes, is on metformin but he is stopped taking that recently.      Patient was also prescribed Lipitor but he stated that he does not want to take statins  He is scheduled to go hip surgery soon in is here for preop clearance as well.  Review of system is otherwise negative his functional status is greater than 5 Mets he is independent with all activity of daily living he does however smoke at this time    Hypertension Yes   Stress Test Performed     Abnormal EKG     Myocardial Infarction     Coronary Artery Disease     CABG     Dysrhythmias     Valvular Disease     PAD     Atrial Fibrillation     Diabetes mellitus Yes   Chronic Lung Disease     Heart failure Yes   Elevated Calcium Score     Cerebrovascular Disease      Dyslipidemia     On Dialysis     History of PCI       Subjective     Cardiac Comorbidities    Heart failure  Yes   Family history of heart failure  No    Family history of sudden cardiac death  No   Pulmonary hypertension  No   ASCVD score The 10-year ASCVD risk score (Arnett DK, et al., 2019) is: 22.1%    Values used to calculate the score:      Age: 52 years      Sex: Male      Is Non-Hispanic African American: No      Diabetic: Yes      Tobacco smoker: Yes      Systolic Blood Pressure: 765 mmHg      Is BP treated: No      HDL Cholesterol: 31 mg/dL      Total Cholesterol: 186 mg/dL    Chronic Kidney Disease  [place] IP Heart Failure Renal Failure Yes No If yes type: No     Allergies  No Known Allergies    Medications  Current Outpatient Medications   Medication Sig    DULoxetine (CYMBALTA DR) 60 mg Oral Capsule, Delayed Release(E.C.) Take 1 Capsule (60 mg total) by mouth Once a day    lidocaine 4 % Adhesive Patch, Medicated Place 2 Patches on the skin Once a day to the left hip (Patient not taking: Reported on 11/08/2022)    meloxicam (MOBIC) 15 mg Oral Tablet Take 1 Tablet (15 mg total) by mouth Once a day    metoprolol succinate (TOPROL-XL) 25 mg Oral Tablet Sustained Release 24 hr Take 1 Tablet (25 mg total) by mouth Once a day    naloxone (KLOXXADO) 8 mg/actuation Nasal Spray, Non-Aerosol 0.1 mL (8 mg total) by nasal (alternating) route Every 2 minutes as needed for actual or suspected opioid overdose. Alternate nostrils if needing more than one dose. Call 911 if used.    sacubitriL-valsartan (ENTRESTO) 49-51 mg Oral Tablet Take 1 Tablet by mouth Twice daily    Sildenafil (VIAGRA) 25 mg Oral Tablet Take 1 Tablet (25 mg total) by mouth Every 24 hours as needed (1 hour before intercourse)    spironolactone (ALDACTONE) 25 mg Oral Tablet Take 1 Tablet (25 mg total) by mouth Every morning with breakfast    traZODone (DESYREL) 100 mg Oral Tablet Take 1 Tablet (100 mg total) by mouth Every night    varenicline  (CHANTIX) dose pak Take as directed.        There are no discontinued medications.      History    Social History     Socioeconomic History    Marital status: Single   Occupational History    Occupation: Research scientist (physical sciences)   Tobacco Use    Smoking status: Every Day     Packs/day: .5     Types: Cigarettes    Smokeless tobacco: Never   Vaping Use    Vaping Use: Never used   Substance and Sexual Activity    Alcohol use: Not Currently     Comment: Drinking from 1 pint to 1/5 liquor per day    Drug use: Not Currently     Frequency: 3.0 times per week     Types: Cocaine     Comment: 3 times per week. not used for couple months 05/16/2022    Sexual activity: Yes     Partners: Female       Past Surgical History:   Procedure Laterality Date    COLECTOMY PARTIAL / TOTAL      INGUINAL HERNIA REPAIR  1990       Family Medical History:       Problem Relation (Age of Onset)    Alzheimer's/Dementia Mother    Breast Cancer Maternal Grandmother    Cancer Maternal Grandmother, Paternal Grandfather, Maternal Grandfather    Congestive Heart Failure Father    No Known Problems Brother              Functional capacity:  Moderately active (cleaning, brisk walk, mowing lawn, etc)       Objective   Review of Systems  Other than ROS in the HPI, all other systems were negative.    Physical Exam    BP 126/85 (Patient Position: Sitting, Cuff Size: Adult Large)   Pulse 84   Temp 36.3 C (97.3 F) (Temporal)   Ht 1.803 m (5\' 11" )   Wt 104 kg (229 lb 4.5 oz)   SpO2 97% Comment: RA  BMI 31.98 kg/m         Constitutional: AA&O  X3 Well developed and well nourished in no acute distress  Eyes: Conjunctiva clear, Pupils equal, round and reactive to light  HENT: Head is normocephalic, atraumatic   Neck: Normal ROM, Supple, symmetrical  Respiratory: Effort normal, clear to auscultation bilaterally.  Cardiovascular: Regular heart rate and rhythm; S1 and S2 normal; no murmur, click, rub or gallop  Gastrointestinal: Bowel sounds normal; soft, non  distended non-tender to palpation  Extremities: extremities normal, atraumatic, no cyanosis or edema  Integumentary:  Skin warm and dry  Neurologic: Grossly normal, no focal neuro deficit, normal coordination and gait  Psychiatric: normal affect and speech.       Laboratory  I have reviewed the patient's lab values and culture results. Pertinent results are below:    CBC    Lab Results   Component Value Date    WBC 9.4 10/22/2022    HGB 13.1 (L) 10/22/2022    HCT 40.0 10/22/2022    PLTCNT 196 10/22/2022    RBC 4.37 (L) 10/22/2022    MCV 91.5 10/22/2022    MCHC 32.8 10/22/2022    MCH 30.0 10/22/2022    MPV 10.9 10/22/2022        Comprehensive Metabolic Profile   Lab Results   Component Value Date    SODIUM 142 10/22/2022    POTASSIUM 5.1 10/22/2022    CHLORIDE 106 10/22/2022    CO2 26 10/22/2022    ANIONGAP 10 10/22/2022    BUN 31 (H) 10/22/2022    CREATININE 1.42 (H) 10/22/2022    GLUCOSE 90 10/22/2022      Lab Results   Component Value Date    ALBUMIN 4.1 10/22/2022    TOTALPROTEIN 7.8 10/22/2022    AST 13 10/22/2022    ALT 9 (L) 10/22/2022          Lab Results   Component Value Date    HA1C 5.3 10/22/2022    INR 1.00 10/22/2022       Lab Results   Component Value Date    TRIG 215 (H) 06/26/2022    HDLCHOL 31 (L) 06/26/2022    LDLCHOL 117 (H) 06/26/2022    CHOLESTEROL 186 06/26/2022       Diagnostics    Cardiology Diagnostics          06/26/2022    09:03 08/07/2022    14:53   EKG   ECG 12-LEAD  ECG 12-LEAD - ADD ON IN [facility] CLINICS ONLY Order Status: Arrived   Echo   TRANSTHORACIC ECHOCARDIOGRAM - ADULT TRANSTHORACIC ECHOCARDIOGRAM - ADULT Order Status: Final result        Previous ECG Results    Echocardiogram    Previous stress test results    Cath    Cardiac CT    Cardiac MRI               Assessment and Plan:    Hridaan is a 52 y.o. male with:     Assessment/Plan   1. Diabetes mellitus type II, non insulin dependent (CMS HCC)        1. 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 Hyperlipidemia, his ASVD score is greater than 20%  20mg  Lipitor   The 10-year ASCVD risk score (Arnett DK, et al., 2019) is: 22.1%    Values used to calculate the score:      Age: 14 years      Sex: Male      Is Non-Hispanic African American: No      Diabetic: Yes      Tobacco smoker: Yes      Systolic Blood Pressure: 126 mmHg      Is BP treated: No      HDL Cholesterol: 31 mg/dL      Total Cholesterol: 186 mg/dL    4. Snoring possible sleep apnea agree to sleep study, would send him for a home sleep study     5. Of note he is on Viagra 25mg  as needed  Orders:     Orders Placed This Encounter    ECG 12-LEAD - ADD ON IN [facility] CLINICS ONLY       Preoperative evaluation:     Consulted for pre op risk assessment, Pertinent cardiac history of heart failure, he is being evaluated for Left hip surgery    Surgical Risk:  The patient to under go intermediate-risk Risk Surgery (1-5%),     Clinical Risk:   Revised Goldman Cardiac Risk Index  High risk surgery (vascular, intraperitoneal, intrathoracic)     H/O ischemic heart disease ( MI, + stress test, current chest pain, Nitrate use, EKG with pathological Q waves)     h/o HF  1   h/o Cerebrovascular Disease     Insulin dependent DM     Preoperative Serum Creatinine >2     TOTAL SCORE  1      Risk of major cardiac  complications including cardiac death, nonfatal MI, nonfatal cardiac arrest, postoperative cardiogenic pulmonary edema, and complete heart block:  No risk factors: 0.4% risk  One risk factor: 1% risk  Two risk factors: 2.4% risk    The patient functional status is >6 METs    The combined clinical/surgical risk is intermediate, The METs is >4 therefore, can proceed with the procedure from cardiac stand point after risk/benefit discussion.  Therefore no further testing or optimization is needed at this time    Follow up:   f/u in 6 months       Patient discussed and seen with Dr. [id number], MD, MS  PGY V Cardiology Fellow, Pager - [phone].    [facility]  Cardiology  [place]         ==========================================================================================  Attending attestation:  The above patient was seen and examined with Dr. Ala Bent on the day of the documented encounter and the plan of care was discussed.  The patient's clinical presentation, histories (medical, surgical, family, social), labs, and imaging were reviewed. I have reviewed the note and am in agreement w/the documenation after making final editions.    [id number], DO, [facility]  Assistant Professor of Medicine   Advanced Heart Failure and Transplant Cardiology  [facility]

## 2022-11-13 NOTE — Telephone Encounter (Signed)
Prior auth for Chantix requested.from [facility], phone [phone].  Sylvan Cheese, RN

## 2022-11-14 ENCOUNTER — Other Ambulatory Visit: Payer: Self-pay

## 2022-11-14 DIAGNOSIS — E119 Type 2 diabetes mellitus without complications: Secondary | ICD-10-CM

## 2022-11-14 LAB — ECG 12-LEAD
Atrial Rate: 76 {beats}/min
Calculated P Axis: 76 degrees
Calculated R Axis: 66 degrees
Calculated T Axis: 78 degrees
PR Interval: 168 ms
QRS Duration: 76 ms
QT Interval: 364 ms
QTC Calculation: 409 ms
Ventricular rate: 76 {beats}/min

## 2022-11-15 ENCOUNTER — Other Ambulatory Visit: Payer: Self-pay

## 2022-11-16 ENCOUNTER — Other Ambulatory Visit: Payer: Self-pay

## 2022-11-18 ENCOUNTER — Other Ambulatory Visit: Payer: Self-pay

## 2022-11-19 ENCOUNTER — Telehealth (HOSPITAL_BASED_OUTPATIENT_CLINIC_OR_DEPARTMENT_OTHER): Payer: Self-pay

## 2022-11-19 NOTE — Telephone Encounter (Signed)
Called to check on smoking and dental clearance. Pt does have dental clearance , pt will fax the clearance today. To make sure we receive. Pt is smoking 3 cigarettes a day

## 2022-11-25 ENCOUNTER — Other Ambulatory Visit: Payer: Self-pay

## 2022-11-25 ENCOUNTER — Other Ambulatory Visit: Payer: MEDICAID | Attending: Family Medicine | Admitting: Rheumatology

## 2022-11-25 DIAGNOSIS — F419 Anxiety disorder, unspecified: Secondary | ICD-10-CM

## 2022-11-25 DIAGNOSIS — R5383 Other fatigue: Secondary | ICD-10-CM | POA: Insufficient documentation

## 2022-11-25 DIAGNOSIS — F32A Depression, unspecified: Secondary | ICD-10-CM

## 2022-11-25 DIAGNOSIS — F4310 Post-traumatic stress disorder, unspecified: Secondary | ICD-10-CM

## 2022-11-25 MED ORDER — SODIUM FLUORIDE 1.1 % DENTAL PASTE
PASTE | DENTAL | 5 refills | Status: DC
Start: 2022-11-25 — End: 2024-07-12
  Filled 2022-11-25: qty 100, 30d supply, fill #0

## 2022-11-27 ENCOUNTER — Other Ambulatory Visit (HOSPITAL_BASED_OUTPATIENT_CLINIC_OR_DEPARTMENT_OTHER): Payer: Self-pay

## 2022-11-27 DIAGNOSIS — R5383 Other fatigue: Secondary | ICD-10-CM

## 2022-11-27 LAB — TESTOSTERONE, TOTAL, MS: TESTOSTERONE,TOTAL,LC/MS/MS: 199 ng/dL — ABNORMAL LOW (ref 250–1100)

## 2022-11-30 ENCOUNTER — Other Ambulatory Visit: Payer: Self-pay

## 2022-12-04 ENCOUNTER — Telehealth (HOSPITAL_BASED_OUTPATIENT_CLINIC_OR_DEPARTMENT_OTHER): Payer: Self-pay

## 2022-12-04 NOTE — Telephone Encounter (Signed)
Called patient to check on smoking cessation. Patient states he quit on last Thursday. Patient will be nicotine tested prior to H&P. If positive patient runs the risk of being cancelled. Patient verbalized understanding.     Leitha Bleak, RN

## 2022-12-05 ENCOUNTER — Encounter (HOSPITAL_BASED_OUTPATIENT_CLINIC_OR_DEPARTMENT_OTHER): Payer: Self-pay

## 2022-12-05 ENCOUNTER — Other Ambulatory Visit: Payer: Self-pay

## 2022-12-05 NOTE — Progress Notes (Signed)
Message left for patient to contact the Sleep Lab regarding Sleep Study results    Traveion Ruddock RRT RPSGT

## 2022-12-06 ENCOUNTER — Encounter (HOSPITAL_BASED_OUTPATIENT_CLINIC_OR_DEPARTMENT_OTHER): Payer: Self-pay

## 2022-12-06 NOTE — Progress Notes (Signed)
Left message for patient to call the sleep center for results. Waynetta Pean RPSGT

## 2022-12-07 ENCOUNTER — Other Ambulatory Visit: Payer: Self-pay

## 2022-12-07 ENCOUNTER — Other Ambulatory Visit: Payer: MEDICAID | Attending: Family Medicine | Admitting: Rheumatology

## 2022-12-07 DIAGNOSIS — R5383 Other fatigue: Secondary | ICD-10-CM | POA: Insufficient documentation

## 2022-12-12 ENCOUNTER — Encounter (HOSPITAL_BASED_OUTPATIENT_CLINIC_OR_DEPARTMENT_OTHER): Payer: Self-pay

## 2022-12-12 ENCOUNTER — Other Ambulatory Visit (HOSPITAL_BASED_OUTPATIENT_CLINIC_OR_DEPARTMENT_OTHER): Payer: Self-pay

## 2022-12-12 DIAGNOSIS — R7989 Other specified abnormal findings of blood chemistry: Secondary | ICD-10-CM

## 2022-12-12 LAB — TESTOSTERONE, TOTAL, MS: TESTOSTERONE,TOTAL,LC/MS/MS: 180 ng/dL — ABNORMAL LOW (ref 250–1100)

## 2022-12-12 NOTE — Progress Notes (Signed)
Attempts have been made to contact the patient regarding Sleep Study results with no return calls. My Chart message sent today    Albesa Seen RRT RPSGT

## 2022-12-13 ENCOUNTER — Encounter (HOSPITAL_COMMUNITY): Payer: Self-pay | Admitting: Internal Medicine

## 2022-12-14 ENCOUNTER — Other Ambulatory Visit: Payer: Self-pay

## 2022-12-14 DIAGNOSIS — F17200 Nicotine dependence, unspecified, uncomplicated: Secondary | ICD-10-CM

## 2022-12-16 ENCOUNTER — Other Ambulatory Visit: Payer: Self-pay

## 2022-12-16 ENCOUNTER — Encounter (HOSPITAL_BASED_OUTPATIENT_CLINIC_OR_DEPARTMENT_OTHER): Payer: Self-pay

## 2022-12-16 DIAGNOSIS — G4733 Obstructive sleep apnea (adult) (pediatric): Secondary | ICD-10-CM

## 2022-12-16 NOTE — Progress Notes (Signed)
Patient returned call regarding Sleep Study results. Dr. Blima Singer recommends CPAP study, patient agreeable. Order placed    Albesa Seen RRT RPSGT

## 2022-12-17 ENCOUNTER — Other Ambulatory Visit: Payer: Self-pay

## 2022-12-22 ENCOUNTER — Other Ambulatory Visit: Payer: Self-pay

## 2022-12-26 ENCOUNTER — Other Ambulatory Visit (HOSPITAL_COMMUNITY): Payer: Self-pay | Admitting: Student in an Organized Health Care Education/Training Program

## 2022-12-26 DIAGNOSIS — F32A Depression, unspecified: Secondary | ICD-10-CM

## 2022-12-26 DIAGNOSIS — F419 Anxiety disorder, unspecified: Secondary | ICD-10-CM

## 2022-12-26 DIAGNOSIS — F4310 Post-traumatic stress disorder, unspecified: Secondary | ICD-10-CM

## 2022-12-27 ENCOUNTER — Encounter (HOSPITAL_COMMUNITY): Payer: Self-pay

## 2022-12-27 ENCOUNTER — Other Ambulatory Visit: Payer: Self-pay

## 2022-12-27 DIAGNOSIS — F4310 Post-traumatic stress disorder, unspecified: Secondary | ICD-10-CM

## 2022-12-27 DIAGNOSIS — F32A Depression, unspecified: Secondary | ICD-10-CM

## 2022-12-27 DIAGNOSIS — F419 Anxiety disorder, unspecified: Secondary | ICD-10-CM

## 2022-12-27 MED ORDER — TRAZODONE 100 MG TABLET
100.0000 mg | ORAL_TABLET | Freq: Every evening | ORAL | 1 refills | Status: AC
Start: 2022-12-27 — End: ?
  Filled 2022-12-27: qty 30, 30d supply, fill #0
  Filled 2023-01-21: qty 30, 30d supply, fill #1
  Filled 2023-02-23: qty 30, 30d supply, fill #2
  Filled 2023-04-07: qty 30, 30d supply, fill #3
  Filled 2023-10-25: qty 30, 30d supply, fill #4

## 2022-12-27 MED ORDER — DULOXETINE 60 MG CAPSULE,DELAYED RELEASE
60.0000 mg | DELAYED_RELEASE_CAPSULE | Freq: Every day | ORAL | 1 refills | Status: DC
Start: 2022-12-27 — End: 2023-02-18
  Filled 2022-12-27: qty 30, 30d supply, fill #0
  Filled 2023-01-21: qty 30, 30d supply, fill #1

## 2022-12-27 NOTE — Telephone Encounter (Signed)
Refill requested for the following medication(s). Last ordered:    DULoxetine (CYMBALTA DR) 60 mg Oral Capsule, Delayed Release(E.C.) Take 1 Capsule (60 mg total) by mouth Once a day Dispense: 90 Capsule, Refills: 0 of 1 remaining  06/26/2022       traZODone (DESYREL) 100 mg Oral Tablet Take 1 Tablet (100 mg total) by mouth Every night Dispense: 90 Tablet, Refills: 0 of 1 remaining  06/26/2022     Last appointment: 06/26/2022    Follow up:    MyChart message sent to patient with phone number to call and schedule appointment.    Marrian Salvage, RN  12/27/2022, 08:24

## 2022-12-28 ENCOUNTER — Other Ambulatory Visit: Payer: Self-pay

## 2022-12-30 ENCOUNTER — Other Ambulatory Visit: Payer: Self-pay

## 2023-01-01 ENCOUNTER — Telehealth (HOSPITAL_BASED_OUTPATIENT_CLINIC_OR_DEPARTMENT_OTHER): Payer: Self-pay

## 2023-01-01 NOTE — Telephone Encounter (Signed)
Left a message for pt to return my call, left my number and ext. Called to check on pt not smoking pt was cleared in jan. Will follow up next week

## 2023-01-02 ENCOUNTER — Telehealth (HOSPITAL_BASED_OUTPATIENT_CLINIC_OR_DEPARTMENT_OTHER): Payer: Self-pay

## 2023-01-02 ENCOUNTER — Encounter (HOSPITAL_BASED_OUTPATIENT_CLINIC_OR_DEPARTMENT_OTHER): Payer: Self-pay

## 2023-01-02 NOTE — Telephone Encounter (Signed)
Pt returned my call and pt still not smoking no nicotine use. Pt was cleared in Jan , asking when he can have his surgery. Informed pt I would check on this . Will call pt next month to continue to check on pt

## 2023-01-03 ENCOUNTER — Other Ambulatory Visit: Payer: Self-pay

## 2023-01-04 ENCOUNTER — Other Ambulatory Visit: Payer: Self-pay

## 2023-01-06 ENCOUNTER — Encounter (INDEPENDENT_AMBULATORY_CARE_PROVIDER_SITE_OTHER): Payer: Self-pay | Admitting: Urology

## 2023-01-06 ENCOUNTER — Other Ambulatory Visit (INDEPENDENT_AMBULATORY_CARE_PROVIDER_SITE_OTHER): Payer: MEDICAID | Admitting: Rheumatology

## 2023-01-06 ENCOUNTER — Other Ambulatory Visit: Payer: Self-pay

## 2023-01-06 ENCOUNTER — Ambulatory Visit: Payer: MEDICAID | Attending: Urology | Admitting: Urology

## 2023-01-06 DIAGNOSIS — R7989 Other specified abnormal findings of blood chemistry: Secondary | ICD-10-CM

## 2023-01-06 DIAGNOSIS — N529 Male erectile dysfunction, unspecified: Secondary | ICD-10-CM

## 2023-01-06 DIAGNOSIS — E291 Testicular hypofunction: Secondary | ICD-10-CM

## 2023-01-06 LAB — PSA SCREENING: PSA: 1.16 ng/mL (ref <=4.00)

## 2023-01-06 MED ORDER — TESTOSTERONE 20.25 MG/1.25 GRAM PER PUMP ACT.(1.62 %) TRANSDERMAL GEL
20.2500 mg | Freq: Every day | TRANSDERMAL | 3 refills | Status: DC
Start: 2023-01-06 — End: 2023-05-26
  Filled 2023-01-06: qty 75, 30d supply, fill #0
  Filled 2023-02-03: qty 75, 30d supply, fill #1
  Filled 2023-03-21: qty 75, 30d supply, fill #2
  Filled 2023-04-21 – 2023-05-01 (×3): qty 75, 30d supply, fill #3

## 2023-01-06 NOTE — Progress Notes (Signed)
NEW PATIENT INITIAL CONSULTATION NOTE    Dear Dr. Meade Maw,    Thank you for requesting me to provide urologic consultation for Everett Myracle for low T.  I sincerely appreciate the opportunity to provide care for your patient.  I will perform a thorough history, physical examination and medical decision making for your patient.      What follows below is a written report of my findings, recommendations for treatment and therapeutic interventions that have been planned or have begun.  This report will be sent to you via the means most preferred by you (fax, email and/or mail).    I thank you again for the opportunity to participate in the care of Alfonzia Maitre.  I will certainly keep you appraised of their progress along the way.  Please feel free to contact me with issues that come up in your patient's care.  I can be reached through my office at [phone] during regular business hours (8 AM-4:30 PM).  Our Department's support staff, urology nurse and nurse practitioners are available to help when I am not available.    With warm regards,    Woodfin Ganja, MD  [facility]  Department of Urology    NEW PATIENT INITIAL CONSULTATION HISTORY, PHYSICAL EXAMINATION & REPORT    Chief Complaint: low T.    History of present illness:  The patient is 52 y.o. 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  Has fatigue, muscle weakness and ED.    Past Medical History:   Diagnosis Date    Anxiety     Depression     Diabetes mellitus type II, non insulin dependent (CMS HCC)     Diverticulosis of colon     GERD (gastroesophageal reflux disease)     Heart failure (CMS HCC)     Hip pain     HTN (hypertension)     Osteoarthritis     Ritalin use disorder, severe (CMS HCC)     Severe alcohol use disorder (CMS HCC)     Severe benzodiazepine use disorder (CMS HCC)     Tobacco abuse            Past Surgical History:   Procedure Laterality Date    COLECTOMY PARTIAL / TOTAL      INGUINAL HERNIA REPAIR   1990           Current Outpatient Medications   Medication Sig    DULoxetine (CYMBALTA DR) 60 mg Oral Capsule, Delayed Release(E.C.) Take 1 Capsule (60 mg total) by mouth Once a day    lidocaine 4 % Adhesive Patch, Medicated Place 2 Patches on the skin Once a day to the left hip (Patient not taking: Reported on 11/08/2022)    meloxicam (MOBIC) 15 mg Oral Tablet Take 1 Tablet (15 mg total) by mouth Once a day    metoprolol succinate (TOPROL-XL) 25 mg Oral Tablet Sustained Release 24 hr Take 1 Tablet (25 mg total) by mouth Once a day    naloxone (KLOXXADO) 8 mg/actuation Nasal Spray, Non-Aerosol 0.1 mL (8 mg total) by nasal (alternating) route Every 2 minutes as needed for actual or suspected opioid overdose. Alternate nostrils if needing more than one dose. Call 911 if used.    sacubitriL-valsartan (ENTRESTO) 49-51 mg Oral Tablet Take 1 Tablet by mouth Twice daily    Sildenafil (VIAGRA) 25 mg Oral Tablet Take 1 Tablet (25 mg total) by mouth Every 24 hours as needed (1 hour before  intercourse) (Patient not taking: Reported on 01/06/2023)    Sodium Fluoride (PREVIDENT 5000 BOOSTER PLUS) 1.1 % Dental Paste Instead of toothpaste use, brush on fluoride for 3 minutes and spit out. Do not eat/drink for 30 minutes after use.    spironolactone (ALDACTONE) 25 mg Oral Tablet Take 1 Tablet (25 mg total) by mouth Every morning with breakfast    traZODone (DESYREL) 100 mg Oral Tablet Take 1 Tablet (100 mg total) by mouth Every night    varenicline (CHANTIX) dose pak Take as directed.       Allergies   Allergen Reactions    Cat Dander Rash, Itching and Swelling       Social History     Socioeconomic History    Marital status: Single     Spouse name: Not on file    Number of children: Not on file    Years of education: Not on file    Highest education level: Not on file   Occupational History    Occupation: night Barista   Tobacco Use    Smoking status: Every Day     Current packs/day: 0.50     Types: Cigarettes    Smokeless  tobacco: Never   Vaping Use    Vaping status: Never Used   Substance and Sexual Activity    Alcohol use: Not Currently     Comment: Drinking from 1 pint to 1/5 liquor per day    Drug use: Not Currently     Frequency: 3.0 times per week     Types: Cocaine     Comment: 3 times per week. not used for couple months 05/16/2022    Sexual activity: Yes     Partners: Female   Other Topics Concern    Not on file   Social History Narrative    Not on file     Social Determinants of Health     Financial Resource Strain: Not on file   Transportation Needs: Not on file   Social Connections: Not on file   Intimate Partner Violence: Not on file   Housing Stability: Not on file       Family Medical History:       Problem Relation (Age of Onset)    Alzheimer's/Dementia Mother    Breast Cancer Maternal Grandmother    Cancer Maternal Grandmother, Paternal Grandfather, Maternal Grandfather    Congestive Heart Failure Father    No Known Problems Brother              REVIEW OF SYSTEMS    All 10 systems reviewed.  Positives in: urologic.    PHYSICAL EXAMINATION:   General:  Well-developed, well-nourished male in no apparent distress.  Head is normal size and shape and without lesions.   Neck is supple  Respiratory examination reveals unlabored breathing  Abdomen appears nondistended  Suprapubic fullness: none.  Testes descended b/l, smaller than normal.  Penis uncircumcised.  Skin is normal  Neurologic examination reveals CN 2-12 are intact  Psychiatric examination reveals normal affect  Musculoskeletal examination reveals good motion in upper extremities    ASSESSMENT: Low T .     PLAN:   I personally reviewed all provided/available records and summarized them above in my HPI  Outside/In EPIC Images:  My impression is Low T based on my review of the images.  Family member present added n/a.  PSA  Will begin TRT with Androgel 1 pump MDI qD.  Return in 3 months.  All questions  of patient and family answered.      Woodfin Ganja, MD, MBA,  FACS  [facility]  Department of Urology    Certified in Pain Management  [employer] Traumatic [employer]

## 2023-01-07 ENCOUNTER — Other Ambulatory Visit: Payer: Self-pay

## 2023-01-09 ENCOUNTER — Encounter (INDEPENDENT_AMBULATORY_CARE_PROVIDER_SITE_OTHER): Payer: Self-pay

## 2023-01-09 ENCOUNTER — Other Ambulatory Visit: Payer: Self-pay

## 2023-01-09 ENCOUNTER — Ambulatory Visit (INDEPENDENT_AMBULATORY_CARE_PROVIDER_SITE_OTHER): Payer: Self-pay | Admitting: Urology

## 2023-01-09 NOTE — Nursing Note (Signed)
RN obtained auth from [facility] for androgel. RN updated Marketing executive Maddie. She notes that prescription ran through without difficulty. RN called pt but he did not answer. RN left voicemail. RN also sent a my chart message. Josem Kaufmann is valid until June 4th 2024. Updated labs will need obtained for further auth.     [place], CLINICAL CARE COORDINATOR

## 2023-01-10 LAB — TESTOSTERONE, TOTAL, MS: TESTOSTERONE,TOTAL,LC/MS/MS: 200 ng/dL — ABNORMAL LOW (ref 250–1100)

## 2023-01-15 ENCOUNTER — Other Ambulatory Visit: Payer: Self-pay

## 2023-01-15 ENCOUNTER — Other Ambulatory Visit (INDEPENDENT_AMBULATORY_CARE_PROVIDER_SITE_OTHER): Payer: Self-pay | Admitting: Urology

## 2023-01-15 ENCOUNTER — Encounter (INDEPENDENT_AMBULATORY_CARE_PROVIDER_SITE_OTHER): Payer: Self-pay

## 2023-01-15 DIAGNOSIS — R7989 Other specified abnormal findings of blood chemistry: Secondary | ICD-10-CM

## 2023-01-16 ENCOUNTER — Other Ambulatory Visit (HOSPITAL_BASED_OUTPATIENT_CLINIC_OR_DEPARTMENT_OTHER): Payer: Self-pay

## 2023-01-16 ENCOUNTER — Other Ambulatory Visit: Payer: Self-pay

## 2023-01-16 DIAGNOSIS — F17200 Nicotine dependence, unspecified, uncomplicated: Secondary | ICD-10-CM

## 2023-01-16 MED ORDER — VARENICLINE TARTRATE 1 MG TABLET
1.0000 mg | ORAL_TABLET | Freq: Two times a day (BID) | ORAL | 2 refills | Status: DC
Start: 2023-01-16 — End: 2023-02-18
  Filled 2023-01-16: qty 56, 28d supply, fill #0
  Filled 2023-02-03 – 2023-02-12 (×3): qty 60, 30d supply, fill #0

## 2023-01-16 NOTE — Telephone Encounter (Signed)
No indication for re-dose pack. Will send in maintenance dosing.

## 2023-01-17 ENCOUNTER — Other Ambulatory Visit: Payer: Self-pay

## 2023-01-18 ENCOUNTER — Other Ambulatory Visit: Payer: Self-pay

## 2023-01-19 ENCOUNTER — Other Ambulatory Visit: Payer: Self-pay

## 2023-01-20 ENCOUNTER — Other Ambulatory Visit: Payer: Self-pay

## 2023-01-21 ENCOUNTER — Other Ambulatory Visit: Payer: Self-pay

## 2023-01-21 DIAGNOSIS — F32A Depression, unspecified: Secondary | ICD-10-CM

## 2023-01-21 DIAGNOSIS — F419 Anxiety disorder, unspecified: Secondary | ICD-10-CM

## 2023-01-21 DIAGNOSIS — F4310 Post-traumatic stress disorder, unspecified: Secondary | ICD-10-CM

## 2023-01-22 ENCOUNTER — Encounter (HOSPITAL_BASED_OUTPATIENT_CLINIC_OR_DEPARTMENT_OTHER): Payer: Self-pay

## 2023-01-24 ENCOUNTER — Other Ambulatory Visit: Payer: Self-pay

## 2023-01-27 ENCOUNTER — Other Ambulatory Visit: Payer: Self-pay

## 2023-01-30 ENCOUNTER — Telehealth (HOSPITAL_BASED_OUTPATIENT_CLINIC_OR_DEPARTMENT_OTHER): Payer: Self-pay

## 2023-01-30 NOTE — Telephone Encounter (Signed)
Left  a  message for pt to return my call,left my number and ext. Pt has h@p  appt on  4/16 and in last note pt was not using any nicotine. Will follow up on Monday if pt has not called back

## 2023-01-31 ENCOUNTER — Encounter (HOSPITAL_BASED_OUTPATIENT_CLINIC_OR_DEPARTMENT_OTHER): Payer: Self-pay | Admitting: Student in an Organized Health Care Education/Training Program

## 2023-01-31 ENCOUNTER — Ambulatory Visit (HOSPITAL_BASED_OUTPATIENT_CLINIC_OR_DEPARTMENT_OTHER)
Payer: MEDICAID | Attending: Student in an Organized Health Care Education/Training Program | Admitting: Student in an Organized Health Care Education/Training Program

## 2023-01-31 ENCOUNTER — Other Ambulatory Visit: Payer: Self-pay

## 2023-01-31 VITALS — Temp 98.1°F | Ht 70.98 in | Wt 237.0 lb

## 2023-01-31 DIAGNOSIS — G8929 Other chronic pain: Secondary | ICD-10-CM

## 2023-01-31 DIAGNOSIS — M25562 Pain in left knee: Secondary | ICD-10-CM | POA: Insufficient documentation

## 2023-01-31 DIAGNOSIS — M1711 Unilateral primary osteoarthritis, right knee: Secondary | ICD-10-CM | POA: Insufficient documentation

## 2023-01-31 DIAGNOSIS — M25561 Pain in right knee: Secondary | ICD-10-CM | POA: Insufficient documentation

## 2023-01-31 MED ORDER — LIDOCAINE (PF) 10 MG/ML (1 %) INJECTION SOLUTION
4.0000 mL | INTRAMUSCULAR | Status: AC
Start: 2023-01-31 — End: 2023-01-31
  Administered 2023-01-31: 4 mg via INTRA_ARTICULAR

## 2023-01-31 MED ORDER — METHYLPREDNISOLONE ACETATE 40 MG/ML SUSPENSION FOR INJECTION
40.0000 mg | INTRAMUSCULAR | Status: AC
Start: 2023-01-31 — End: 2023-01-31
  Administered 2023-01-31: 40 mg via INTRA_ARTICULAR

## 2023-01-31 NOTE — Procedures (Signed)
JOINT REPLACEMENT, [facility]  [place] [address]  Operated by [facility]  Procedure Note    Name: Leonard [name] MRN:  [id number]   Date: 01/31/2023 DOB:  [birth year] (51 y.o.)         Joint Asp/Inj    Performed by: Kandis Nab, PA-C  Authorized by: Kandis Nab, PA-C    Consent:     Consent obtained:  Verbal    Consent given by:  Patient    Risks discussed:  Bleeding and pain    Alternatives discussed:  No treatment  Universal protocol:     Patient identity confirmed:  Verbally with patient  Location:     Location:  Knee    Knee joint: bilateral.  Anesthesia (see MAR for exact dosages):     Anesthesia method:  Topical application    Topical anesthetic:  Ethyl chloride spray  Procedure details:     Needle gauge:  22 G    Approach:  Medial    Steroid injected: yes      Specimen collected: no    Post-procedure details:     Dressing:  Adhesive bandage    Patient tolerance of procedure:  Tolerated well, no immediate complications  Comments:      The pt was offered a corticosteroid injection and provided verbal consent.  The bilateral knees was injected with 4cc's of 1% lidocaine and 40 mg depomedrol using sterile technique. The patient was advised that the injection site is frequently tender the evening of the injection and that on some occasions, the joint pain is worse after the injection for the first 24 to 48 hours.  The patient was advised to alternate between ice and heat, and to use OTC pain relievers for the pain until it subsides.  The patient was also advised to contact the office if the pain becomes severe and cannot be controlled with the above remedies.  The patient was warned about facial flushing and warmth after the injection and to use Benadryl if this occurs.  Finally the patient was advised that it may take up to 72 hours for the injection to reach peak effectiveness.  The patient was advised to contact this office for follow up if the  injection provided no significant pain relief.    Kandis Nab, PA-C  01/31/2023, 09:10           Kandis Nab, PA-C

## 2023-02-03 ENCOUNTER — Encounter (HOSPITAL_BASED_OUTPATIENT_CLINIC_OR_DEPARTMENT_OTHER): Payer: Self-pay

## 2023-02-03 ENCOUNTER — Other Ambulatory Visit: Payer: Self-pay

## 2023-02-04 ENCOUNTER — Other Ambulatory Visit: Payer: Self-pay

## 2023-02-04 ENCOUNTER — Ambulatory Visit (HOSPITAL_BASED_OUTPATIENT_CLINIC_OR_DEPARTMENT_OTHER): Payer: Self-pay | Admitting: Orthopaedic Surgery

## 2023-02-05 ENCOUNTER — Other Ambulatory Visit: Payer: Self-pay

## 2023-02-07 ENCOUNTER — Encounter (INDEPENDENT_AMBULATORY_CARE_PROVIDER_SITE_OTHER): Payer: Self-pay

## 2023-02-11 ENCOUNTER — Other Ambulatory Visit: Payer: Self-pay

## 2023-02-12 ENCOUNTER — Encounter (HOSPITAL_BASED_OUTPATIENT_CLINIC_OR_DEPARTMENT_OTHER): Payer: Self-pay

## 2023-02-12 ENCOUNTER — Other Ambulatory Visit: Payer: Self-pay

## 2023-02-12 NOTE — H&P (Signed)
[facility]  Orthopaedic Medical Optimization Program  History and Physical     Leonard, [name], 52 y.o. male Medical Record Number: [id number]   Date of Birth:  12/15/1970 Date of Service:  02/18/2023    Information Obtained from: patient and history reviewed via medical record and chart review Surgeon:  Eliott Nine, MD     CHIEF COMPLAINT  Pre-Operative History and Physical Examination for L THA requested by Eliott Nine, MD.    ASSESSMENT & PLAN   Problem List Items Addressed This Visit    None  Visit Diagnoses       Preop testing    -  Primary    Relevant Orders    COMPREHENSIVE METABOLIC PANEL, NON-FASTING    CBC/DIFF    TYPE AND SCREEN    Nicotine And Cotinine, Serum/Plasma    Tobacco abuse        Relevant Orders    Nicotine And Cotinine, Serum/Plasma          Left hip DJD  - Patient has failed conservative therapy, operative management is planned. L THA 03/05/23  - Pre-operative labs ordered, pending  - EKG reviewed from 11/13/22 by this provider and demonstrates NSR  - Patient is without risk factors for excessive bleeding or blood clots.  - Pain management as per surgical team. Continue conservative symptom relief until operative management.  - Medication hold discussed with patient during visit and pt given printed handout with instructions    - Antibiotic prophylaxis: Ancef  - VTE prophylaxis: ASA     Special considerations:   Injection 10/25/22  No NSAIDs  On testosterone     Right knee DJD  -following with Ortho for injections, last 01/31/23    CKD3a  -baseline Cr around 1.4. Avoid NSAIDs    Low testosterone  -follows with Urology  -hold testosterone 14 days pre/post-op     EtOH abuse disorder  -Inpatient at [facility] 05/16/22-06/13/22  -EtOH level negative 10/22/22. Denies use.  -Follows with Psych therapist     History of polysubstance abuse  -Xanax, Ritalin, previously cocaine, psilocybin, cannabis  -urine drug screen negative 10/22/22. Denies use.     Tobacco abuse  disorder  -previously smoked 0.5ppd x25 years. 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 No further testing pre-op.  -TTE 06/26/22 EF 54%, normal diastolic function. Reports EF previously 30% when living in [place]. Denies history of ischemic evaluation  -Continue metoprolol succinate  -Hold Entresto, Spironolactone day of surgery  -Avoid maintenance IVF     Ruptured diverticulum  -s/p partial colectomy and ostomy about 2018. S/p ostomy reversal  -no known acute issues     Hypertension  -Continue metoprolol succinate  -Hold Entresto, Spironolactone day of surgery     Dyslipidemia  -has self discontinued Lipitor and trying diet     Prediabetes  -A1c 5.3% in 10/2022  -PCP recently stopped metformin. Trying to manage with diet     Abnormal fecal DNA/Cologuard test  -Screening test, no symptoms. Colonoscopy planned 06/19/23     Insomnia  -continue trazodone     Severe OSA  -Sleep study 10/24/22 with AHI 58  -Has another sleep study tomorrow for CPAP trial and thinks he will start CPAP after the study. Currently not wearing CPAP  Erectile dysfunction  -hold Viagra 3 days pre-op     Poor dentition  -dental clearance form scanned 11/20/22 but he is getting cavities filled 02/24/23    Pre-operative examination  - Patient is at an elevated risk of perioperative complications for the planned moderate risk surgery pending lab results, EKG, and below clearances.   - Clearances pending: none    Orders Placed This Encounter    COMPREHENSIVE METABOLIC PANEL, NON-FASTING    CBC/DIFF    Nicotine And Cotinine, Serum/Plasma    TYPE AND SCREEN       The patient was provided with the following medication instructions prior to surgery, which is tentatively scheduled on 03/05/23:    14 days before surgery: hold over the counter supplements, Vitamins, and  Multivitamins. Garlic, Gingko, [name], Fish Oil, Omega 3 Fatty Acids, Vascepa. Hold hormone replacement (Estrogen or Testosterone).    7 days before surgery: hold Aspirin and any aspirin-containing products (excedrin, alka seltzer, Goody's).    3 days before surgery: hold NSAIDs/anti-inflammatories (Advil, Aleve, naproxen, Mobic, diclofenac, ibuprofen, Celebrex).    3 days before surgery: hold Viagra    The morning of surgery: hold entresto, spironolactone, trazodone    Please continue the following medications as prescribed: metoprolol succinate    Tylenol is safe to take up until and including on the morning of surgery.    If any of your medications change or if you are started on anything new before your surgery, please call the office to let us know.    Please call our nursing staff with any questions or concerns at [phone] ext [phone] or [phone].     Operative Reference    Pain contract on file with other provider: No         Patient history of DVT:   No     Recommended DVT ppx aspirin     Prophylactic antibiotics   Ancef     Patient history of   No thrombotic events     Sleep Apnea   Yes-Noncompliant with CPAP     Current Tobacco Use:    No     Current Drug Use:    No     Current Alcohol Use:    No     Fast Track   No   Allergy to the following: latex, metal/nickel, iodine, acrylic   No     Open wounds/rashes   No       History of chemo/radiation   No     Research patient   Discussed eligibility for current study, PEPPER.     Hx MRSA   No     Any personal or family history of anesthesia complications?    No     Most recent joint injections  (last 3 months)   10/25/22   Vulnerable population patient or hospitalization within 3 months?   No     Dental issues that need addressed prior to surgery:        Being addressed     Personal or family history of bleeding/clotting disorders:     No     History of pulmonary disorders:      No     Pacemaker, ICD, or implanted device?   No     History of  MI/CVA/cardiac stents/cardiac surgeries:      CHF       The following consultations have been obtained: Cardiology, OM[facility], and Dental    HISTORY OF PRESENT ILLNESS:   Leonard  [name] is a 52 y.o. male that presents with need for pre-operative history and physical.  He has a history of Left hip DJD requiring Left THA.     The patient was seen in the OMOP clinic on 10/22/22 by me. Since that visit, the patient denies any significant changes in their health, medications, or activity level. The patient has not been hospitalized, visited the ER or Urgent Care, or been prescribed any antibiotics or steroids since their last visit.     He says that he feels his baseline and is ready for surgery.    He was started on testosterone by Urology. He saw cardiology.    Dental clearance was received 11/20/22. He says that he has another appointment on 4/22 to "fix some cavities."    He says that he stopped smoking about 3 weeks ago. He says he is doing well with this.    Denies any drug use.    Patient denies any fever, chills, dizziness, lightheadedness, chest pain, SOB, abdominal pain, nausea, vomiting, constipation, diarrhea, or trouble/pain with urination.     Pt is able to ambulate 4 blocks as well as a flight of stairs without experiencing SOB.    The patient denies being diagnosed with COVID-19 in the past 90 days.      Past Medical History:  Past Medical History:   Diagnosis Date    Anxiety     Depression     Diabetes mellitus type II, non insulin dependent (CMS HCC)     Diverticulosis of colon     GERD (gastroesophageal reflux disease)     Heart failure (CMS HCC)     Hip pain     HTN (hypertension)     Low testosterone     Osteoarthritis     Ritalin use disorder, severe (CMS HCC)     Severe alcohol use disorder (CMS HCC)     Severe benzodiazepine use disorder (CMS HCC)     Tobacco abuse      Past Surgical History:   Procedure Laterality Date    COLECTOMY PARTIAL / TOTAL      INGUINAL HERNIA REPAIR  1990     Allergies    Allergen Reactions    Cat Dander Rash, Itching and Swelling     Outpatient Medications Marked as Taking for the 02/18/23 encounter (Office Visit) with Diamantina Providence, MD   Medication Sig    meloxicam (MOBIC) 15 mg Oral Tablet Take 1 Tablet (15 mg total) by mouth Once a day    metoprolol succinate (TOPROL-XL) 25 mg Oral Tablet Sustained Release 24 hr Take 1 Tablet (25 mg total) by mouth Once a day    sacubitriL-valsartan (ENTRESTO) 49-51 mg Oral Tablet Take 1 Tablet by mouth Twice daily    Sodium Fluoride (PREVIDENT 5000 BOOSTER PLUS) 1.1 % Dental Paste Instead of toothpaste use, brush on fluoride for 3 minutes and spit out. Do not eat/drink for 30 minutes after use.    spironolactone (ALDACTONE) 25 mg Oral Tablet Take 1 Tablet (25 mg total) by mouth Every morning with breakfast    testosterone (ANDROGEL) 20.25 mg/1.25 gram (1.62 %) Transdermal Gel in Metered-dose Pump Place 1 pump on the skin Once a day as directed    traZODone (DESYREL) 100 mg Oral Tablet Take 1 Tablet (100 mg total) by mouth Every night     Social History     Tobacco Use    Smoking status: Former     Types: Cigarettes  Smokeless tobacco: Never   Vaping Use    Vaping status: Never Used   Substance Use Topics    Alcohol use: Not Currently     Comment: Drinking from 1 pint to 1/5 liquor per day    Drug use: Not Currently     Frequency: 3.0 times per week     Types: Cocaine     Comment: 3 times per week. not used for couple months 05/16/2022     Family Medical History:       Problem Relation (Age of Onset)    Alzheimer's/Dementia Mother    Breast Cancer Maternal Grandmother    Cancer Maternal Grandmother, Paternal Grandfather, Maternal Grandfather    Congestive Heart Failure Father    No Known Problems Brother              ROS:  Review of Systems   Constitutional:  Negative for activity change, appetite change, fatigue and unexpected weight change.   HENT:  Negative for congestion, dental problem, sinus pain, sore throat and trouble  swallowing.    Eyes:  Negative for discharge and visual disturbance.   Respiratory:  Negative for cough, chest tightness, shortness of breath and wheezing.    Cardiovascular:  Negative for chest pain, palpitations and leg swelling.   Gastrointestinal:  Negative for abdominal pain, constipation, diarrhea, nausea and vomiting.   Genitourinary:  Negative for difficulty urinating, dysuria, frequency and urgency.   Musculoskeletal:  Positive for arthralgias. Negative for neck pain.   Skin:  Negative for rash and wound.   Neurological:  Negative for dizziness, syncope, light-headedness and headaches.   Hematological:  Does not bruise/bleed easily.   Psychiatric/Behavioral:  Negative for behavioral problems.        Exam:  BP 118/72   Pulse 69   Temp 36.1 C (97 F) (Temporal)   Ht 1.806 m (5' 11.1")   Wt 107 kg (234 lb 12.6 oz)   BMI 32.65 kg/m       Body mass index is 32.65 kg/m.  Physical Exam  Vitals reviewed.   Constitutional:       General: He is not in acute distress.     Appearance: Normal appearance. He is not ill-appearing.   HENT:      Head: Normocephalic and atraumatic.      Mouth/Throat:      Mouth: Mucous membranes are moist.   Eyes:      Extraocular Movements: Extraocular movements intact.      Conjunctiva/sclera: Conjunctivae normal.   Neck:      Vascular: No carotid bruit.   Cardiovascular:      Rate and Rhythm: Normal rate and regular rhythm.      Pulses: Normal pulses.      Heart sounds: Normal heart sounds. No murmur heard.  Pulmonary:      Effort: Pulmonary effort is normal.      Breath sounds: Normal breath sounds. No wheezing or rhonchi.   Abdominal:      General: Bowel sounds are normal. There is no distension.      Palpations: Abdomen is soft.      Tenderness: There is no abdominal tenderness.   Musculoskeletal:         General: No swelling or tenderness.      Cervical back: Normal range of motion and neck supple.      Right lower leg: No edema.      Left lower leg: No edema.   Skin:      General:  Skin is warm and dry.      Findings: No bruising or lesion.   Neurological:      General: No focal deficit present.      Mental Status: He is alert and oriented to person, place, and time.      Motor: No weakness.   Psychiatric:         Mood and Affect: Mood normal.         Behavior: Behavior normal.       Diagnostics:  BMP   Lab Results   Component Value Date    SODIUM 142 10/22/2022    POTASSIUM 5.1 10/22/2022    CHLORIDE 106 10/22/2022    CO2 26 10/22/2022    BUN 31 (H) 10/22/2022    CREATININE 1.42 (H) 10/22/2022    ANIONGAP 10 10/22/2022    BUNCRRATIO 22 10/22/2022    GFR 59 (L) 10/22/2022        LFTs   Lab Results   Component Value Date    AST 13 10/22/2022    ALT 9 (L) 10/22/2022    ALKPHOS 53 10/22/2022    TOTBILIRUBIN 0.2 (L) 10/22/2022    TOTALPROTEIN 7.8 10/22/2022    ALBUMIN 4.1 10/22/2022   No results for input(s): "PREALBUMIN", "LIPASE", "UROBILINOGEN", "GAMMAGT", "LDH", "AMYLASE", "AMMONIA" in the last 72 hours.     CBC   Lab Results   Component Value Date    WBC 9.4 10/22/2022    HGB 13.1 (L) 10/22/2022    HCT 40.0 10/22/2022    PLTCNT 196 10/22/2022      Diff   Lab Results   Component Value Date    PMNS 60.2 10/22/2022    MONOCYTES 6.7 10/22/2022    BASOPHILS 0.7 10/22/2022    BASOPHILS <0.10 10/22/2022    PMNABS 5.66 10/22/2022    LYMPHSABS 2.53 10/22/2022    EOSABS 0.47 10/22/2022    MONOSABS 0.63 10/22/2022        Coagulation Studies   Lab Results   Component Value Date    PROTHROMTME 11.6 10/22/2022    INR 1.00 10/22/2022         Inflammatory markers   No results found for: "AESR", "CREAPROINFLA"     * All labs reviewed *    Revised Goldman Cardiac Risk Index:  Of the six independent predictors of major cardiac complications, patient exhibits the following:  history of heart failure                                                                        1 point           Total Points: 1    Rate of cardiac death, nonfatal myocardial infarction, and nonfatal cardiac arrest  One risk factor  - 1.0 percent (95% CI 0.5-1.4 percent)  This represents the percentage risk of major cardiac complications (cardiac death, non-fatal MI, non-fatal cardiac arrest, post-operative cardiogenic pulmonary edema, and/or complete heart block).    ASA: ASA 3 - Patient with moderate systemic disease with functional limitations    METs: > 4 METs - Climbing a flight of stairs    NSQIP:  NSQIP Risk Calculator    Eliott Nine, MD comments related to surgery may be added  after this signature for additional information and surgical plans.      Abbie Sons, MD, MPH  02/18/2023, 12:02  Orthopaedic Medical Optimization Program  Dept of Orthopaedics

## 2023-02-13 ENCOUNTER — Other Ambulatory Visit: Payer: Self-pay

## 2023-02-18 ENCOUNTER — Ambulatory Visit (HOSPITAL_BASED_OUTPATIENT_CLINIC_OR_DEPARTMENT_OTHER): Payer: MEDICAID | Admitting: Internal Medicine

## 2023-02-18 ENCOUNTER — Inpatient Hospital Stay (HOSPITAL_BASED_OUTPATIENT_CLINIC_OR_DEPARTMENT_OTHER)
Admission: RE | Admit: 2023-02-18 | Discharge: 2023-02-18 | Disposition: A | Discharge status: Home / Self Care | Payer: MEDICAID | Source: Ambulatory Visit | Admitting: Radiology

## 2023-02-18 ENCOUNTER — Ambulatory Visit (HOSPITAL_BASED_OUTPATIENT_CLINIC_OR_DEPARTMENT_OTHER): Payer: MEDICAID

## 2023-02-18 ENCOUNTER — Encounter (HOSPITAL_BASED_OUTPATIENT_CLINIC_OR_DEPARTMENT_OTHER): Payer: Self-pay | Admitting: Internal Medicine

## 2023-02-18 ENCOUNTER — Other Ambulatory Visit (HOSPITAL_BASED_OUTPATIENT_CLINIC_OR_DEPARTMENT_OTHER): Payer: MEDICAID

## 2023-02-18 ENCOUNTER — Encounter (HOSPITAL_BASED_OUTPATIENT_CLINIC_OR_DEPARTMENT_OTHER): Payer: Self-pay | Admitting: Student in an Organized Health Care Education/Training Program

## 2023-02-18 ENCOUNTER — Ambulatory Visit
Payer: MEDICAID | Attending: Student in an Organized Health Care Education/Training Program | Admitting: Student in an Organized Health Care Education/Training Program

## 2023-02-18 ENCOUNTER — Encounter (HOSPITAL_BASED_OUTPATIENT_CLINIC_OR_DEPARTMENT_OTHER): Payer: Self-pay | Admitting: Orthopaedic Surgery

## 2023-02-18 ENCOUNTER — Other Ambulatory Visit: Payer: Self-pay

## 2023-02-18 ENCOUNTER — Encounter (HOSPITAL_COMMUNITY): Payer: Self-pay

## 2023-02-18 ENCOUNTER — Encounter (HOSPITAL_BASED_OUTPATIENT_CLINIC_OR_DEPARTMENT_OTHER): Payer: Self-pay

## 2023-02-18 VITALS — BP 118/72 | HR 69 | Temp 97.0°F | Ht 71.1 in | Wt 234.8 lb

## 2023-02-18 DIAGNOSIS — Z720 Tobacco use: Secondary | ICD-10-CM | POA: Insufficient documentation

## 2023-02-18 DIAGNOSIS — Z01818 Encounter for other preprocedural examination: Secondary | ICD-10-CM | POA: Insufficient documentation

## 2023-02-18 DIAGNOSIS — M25552 Pain in left hip: Secondary | ICD-10-CM | POA: Insufficient documentation

## 2023-02-18 DIAGNOSIS — R7989 Other specified abnormal findings of blood chemistry: Secondary | ICD-10-CM

## 2023-02-18 DIAGNOSIS — M1612 Unilateral primary osteoarthritis, left hip: Secondary | ICD-10-CM | POA: Insufficient documentation

## 2023-02-18 LAB — COMPREHENSIVE METABOLIC PANEL, NON-FASTING
ALBUMIN: 4 g/dL (ref 3.5–5.0)
ALKALINE PHOSPHATASE: 50 U/L (ref 45–115)
ALT (SGPT): 17 U/L (ref 10–55)
ANION GAP: 7 mmol/L (ref 4–13)
AST (SGOT): 14 U/L (ref 8–45)
BILIRUBIN TOTAL: 0.2 mg/dL — ABNORMAL LOW (ref 0.3–1.3)
BUN/CREA RATIO: 25 — ABNORMAL HIGH (ref 6–22)
BUN: 36 mg/dL — ABNORMAL HIGH (ref 8–25)
CALCIUM: 9.9 mg/dL (ref 8.6–10.2)
CHLORIDE: 106 mmol/L (ref 96–111)
CO2 TOTAL: 26 mmol/L (ref 22–30)
CREATININE: 1.42 mg/dL — ABNORMAL HIGH (ref 0.75–1.35)
ESTIMATED GFR - MALE: 59 mL/min/BSA — ABNORMAL LOW (ref >=60)
GLUCOSE: 91 mg/dL (ref 65–125)
POTASSIUM: 5.1 mmol/L (ref 3.5–5.1)
PROTEIN TOTAL: 7.4 g/dL (ref 6.4–8.3)
SODIUM: 139 mmol/L (ref 136–145)

## 2023-02-18 LAB — CBC WITH DIFF
BASOPHIL #: <0.1 10*3/uL (ref <=0.20)
BASOPHIL %: 0.7 %
EOSINOPHIL #: 0.34 10*3/uL (ref <=0.50)
EOSINOPHIL %: 3.8 %
HCT: 38.5 % — ABNORMAL LOW (ref 38.9–52.0)
HGB: 12.1 g/dL — ABNORMAL LOW (ref 13.4–17.5)
IMMATURE GRANULOCYTE #: <0.1 10*3/uL (ref <0.10)
IMMATURE GRANULOCYTE %: 0.2 % (ref 0.0–1.0)
LYMPHOCYTE #: 2.96 10*3/uL (ref 1.00–4.80)
LYMPHOCYTE %: 33.2 %
MCH: 29.5 pg (ref 26.0–32.0)
MCHC: 31.4 g/dL (ref 31.0–35.5)
MCV: 93.9 fL (ref 78.0–100.0)
MONOCYTE #: 0.66 10*3/uL (ref 0.20–1.10)
MONOCYTE %: 7.4 %
MPV: 10.7 fL (ref 8.7–12.5)
NEUTROPHIL #: 4.88 10*3/uL (ref 1.50–7.70)
NEUTROPHIL %: 54.7 %
PLATELETS: 179 10*3/uL (ref 150–400)
RBC: 4.1 10*6/uL — ABNORMAL LOW (ref 4.50–6.10)
RDW-CV: 13 % (ref 11.5–15.5)
WBC: 8.9 10*3/uL (ref 3.7–11.0)

## 2023-02-18 LAB — TYPE AND SCREEN
ABO/RH(D): A POS
ANTIBODY SCREEN: NEGATIVE

## 2023-02-18 NOTE — Nursing Note (Addendum)
PRE-OP ORTHOPAEDIC CM ASSESSMENT    Care Management Pre-admission Assessment - Met w patient at [facility] Joint pre-op Class.    Home Set-up - The patient lives w his brother in a house w 5 STE. There is a tub/shower on the main floor. There are 12 steps up to the bedroom where there is a 1/2 bath.    Equipment - Has no DME.    Planned Therapy - Home w assist    RAPT - 11    Planned DVT Prophylaxis - ASA    Planned Medicines - celecoxib, ondansetron, oxycodone. Per [facility], meds do not require a PA.    Pharmacy - [facility]    Transport/Assist - family    Surgeon/Surgery/Date - Dr Graciela Husbands 03/05/23 L THA

## 2023-02-18 NOTE — Nursing Note (Signed)
Patient attended pre op education class .  They have a playbook with my contact information including my pager number. Education material reviewed with patient.   They were encouraged to bring this information with them in their playbooks to surgery.  They were instructed that they would receive 42 tablets of oxycodone for post op pain control and that this medication would not be refilled.  They asked and answered questions appropriately. To determine what will be used for DVT prevention after discussion of PEPPER study

## 2023-02-18 NOTE — Progress Notes (Signed)
[facility]  52 y.o. man  presents today for pre op H & P for planned left total Hip replacement  Date of surgery: 03/05/23.  He has tried all reasonable attempts to manage pain with over the counter pain relievers, prescription NSAIDs, intra-articular injections, and activity modification  He has had to curtail important activities of daily living as well as recreational activities  He rates the pain a 7/10 in the affected joint  The severity of  his condition is to the point that any additional non-surgical care is futile.  The patient has been cleared for surgery by our medical optimization specialists     Review of systems   Denies recent illness or infection  Denies recent hospitalization or visit to the ER  Denies a change in medication  The remainder of the review of systems is negative    Brief Exam:  BP 118/72   Pulse 69   Temp 36.1 C (97 F) (Temporal)   Ht 1.806 m (5' 11.1")   Wt 107 kg (234 lb 12.6 oz)   BMI 32.65 kg/m        Intended surgical site is without blemish  Pedal pulses are Normal   Range of motion left hip: limited secondary to pain and stiffness    Radiographs  Imaging: Today's images of the left hip reveals DJD as evidenced by severe joint space narrowing, subchondral sclerosis, and subchondral cysts. Images of the right hip reveals moderate to severe R hip DJD as evidenced by subchondral sclerosis and joint space narrowing.    Impression  Primary osteoarthritis of left hip    Left hip pain     Plan  left total Hip replacement   We made the decision to proceed with surgery today after being cleared for surgery by our [facility] colleagues  Implants: Katrinka Blazing and Nephew Anthology/A-Fit and R3  Bearing surface chosen after conferring with patient: oxidized zirconium on poly  Implant fixation chosen after conferring with patient: cementless fixation  Pre op testing per [facility]  Anesthesia discussed with patient: Spinal.  DVT prophylaxis: aspirin.  Prophylactic  antibiotic:Ancef.  Tranexamic Acid: Parenterally  Hospital length of stay: 1 night.  Planned disposition: home with family  Post-op prescriptions were not issued  Blood products: Accept.  The patient will Accept photographs and video at the time of surgery.  No NSAIDs  Through shared decision making we selected the surgical approach, implant fixation and bearing surface.  The risks of the proposed surgery were reviewed at great length.  Specifically we described the risks of surgery to include the risk of infection, dislocation, bone fracture, limb length inequality, surgical site numbness, nerve or vascular injury, deep vein thrombosis, chronic pain, the need for future revisions, and even the risk of death as a result of things we could not possibly predict like a heart attack, stroke, pneumonia, sepsis and pulmonary embolus. We explained that infection is the number one most common serious post-operative complication that can be so severe as to necessitate multiple surgeries, permanent removal of the prosthesis resulting in a shortened painful limb, or even amputation.  We explained that these complications are relatively rare and sometimes devastating.  We explained that we are obligated to describe the risks and alternative treatments so that he can make an informed decision.  The consent was signed and witnessed and we believe this to be proper informed consent.  Preoperative instructions were reviewed.  See "patient instructions" in the AVS.  The patient was encouraged to call with questions or a change in health status.      This patient was seen independently in clinic.  Veatrice Kells, PA-C  02/18/2023, 15:54      The patient's condition and plan of care was discussed with me.  The note above was personally reviewed and appended before signature.    Kathee Delton MD  Associate Professor of Orthopaedic Surgery  Ph   [phone]  Fax [phone]  Pager [phone]  02/19/2023 07:14          Cc    Self,  Referral  No address on file    Hermenia Fiscal, MD  [address]  [place] [address]

## 2023-02-18 NOTE — Progress Notes (Signed)
Total Joint History and Physical Teaching  HPI:   Leonard [name] is a 52 y.o. male [facility] for pre op teaching leading up to L THA with Dr. Graciela Husbands.    Date of Surg: 03/05/23                       Past Medical History:   Diagnosis Date    Anxiety     Depression     Diabetes mellitus type II, non insulin dependent (CMS HCC)     Diverticulosis of colon     GERD (gastroesophageal reflux disease)     Heart failure (CMS HCC)     Hip pain     HTN (hypertension)     Osteoarthritis     Ritalin use disorder, severe (CMS HCC)     Severe alcohol use disorder (CMS HCC)     Severe benzodiazepine use disorder (CMS HCC)     Tobacco abuse                             Past Surgical History:   Procedure Laterality Date    COLECTOMY PARTIAL / TOTAL      INGUINAL HERNIA REPAIR  1990         Social Hx:  reports that he has been smoking cigarettes. He has never used smokeless tobacco. He reports that he does not currently use alcohol. He reports that he does not currently use drugs after having used the following drugs: Cocaine. Frequency: 3.00 times per week.                     Social History     Tobacco Use   Smoking Status Every Day    Current packs/day: 0.50    Types: Cigarettes   Smokeless Tobacco Never                      Pre operative Assessment:   Home environment:  Lives in a house with 5 steps to enter.  Full bath on main with tub/shower.  12 steps to upper level bedroom and 1/2 bath.      Social situation:  Lives with brother    Home Equipment:  none    Current Level of Function: antalgic gait with limited/modified activity    Patient Goals: no pain, be active    Pt reviewed Playbook for Total Joint Replacement with patient.   Discussed home set up and adaptations to meet post op needs.      Anticipated D/C Needs:   Equipment: FWW, cane, toilet riser  Recommendation: home with assistance    Therapist :     Fabio Bering, PT 02/18/2023 09:39  Pager #:  [phone]

## 2023-02-18 NOTE — Patient Instructions (Signed)
The patient was provided with the following medication instructions prior to surgery, which is tentatively scheduled on 03/05/23:     14 days before surgery: hold over the counter supplements, Vitamins, and Multivitamins. Garlic, Gingko, St. Shardai Star's Wart, Fish Oil, Omega 3 Fatty Acids, Vascepa. Hold hormone replacement (Estrogen or Testosterone).     7 days before surgery: hold Aspirin and any aspirin-containing products (excedrin, alka seltzer, Goody's).     3 days before surgery: hold NSAIDs/anti-inflammatories (Advil, Aleve, naproxen, Mobic, diclofenac, ibuprofen, Celebrex).     3 days before surgery: hold Viagra     The morning of surgery: hold entresto, spironolactone, trazodone, varenicline     Please continue the following medications as prescribed: duloxetine, metoprolol succinate     Tylenol is safe to take up until and including on the morning of surgery.     If any of your medications change or if you are started on anything new before your surgery, please call the office to let us know.     Please call our nursing staff with any questions or concerns at [phone] ext [phone] or [phone].

## 2023-02-19 ENCOUNTER — Inpatient Hospital Stay
Admission: RE | Admit: 2023-02-19 | Discharge: 2023-02-19 | Disposition: A | Discharge status: Home / Self Care | Payer: MEDICAID | Source: Ambulatory Visit | Attending: Internal Medicine | Admitting: Internal Medicine

## 2023-02-19 DIAGNOSIS — M1612 Unilateral primary osteoarthritis, left hip: Secondary | ICD-10-CM

## 2023-02-19 DIAGNOSIS — G4733 Obstructive sleep apnea (adult) (pediatric): Secondary | ICD-10-CM

## 2023-02-20 ENCOUNTER — Encounter (HOSPITAL_COMMUNITY): Payer: Self-pay

## 2023-02-20 LAB — NICOTINE AND COTININE, SERUM/PLASMA
COTININE, SERUM/PLASMA: 4 ng/mL
NICOTINE, SERUM/PLASMA: <2 ng/mL

## 2023-02-21 ENCOUNTER — Encounter (HOSPITAL_COMMUNITY): Payer: Self-pay

## 2023-02-21 ENCOUNTER — Inpatient Hospital Stay (HOSPITAL_COMMUNITY)
Admission: RE | Admit: 2023-02-21 | Discharge: 2023-02-21 | Discharge status: Home / Self Care | Payer: MEDICAID | Source: Ambulatory Visit

## 2023-02-21 HISTORY — DX: Sleep apnea, unspecified: G47.30

## 2023-02-21 HISTORY — DX: Type 2 diabetes mellitus without complications (CMS HCC): E11.9

## 2023-02-22 LAB — TESTOSTERONE, TOTAL, MS: TESTOSTERONE,TOTAL,LC/MS/MS: 331 ng/dL (ref 250–1100)

## 2023-02-23 DIAGNOSIS — F32A Depression, unspecified: Secondary | ICD-10-CM

## 2023-02-24 ENCOUNTER — Other Ambulatory Visit: Payer: Self-pay

## 2023-02-24 ENCOUNTER — Other Ambulatory Visit (HOSPITAL_COMMUNITY): Payer: Self-pay

## 2023-02-24 DIAGNOSIS — F4310 Post-traumatic stress disorder, unspecified: Secondary | ICD-10-CM

## 2023-02-24 DIAGNOSIS — F419 Anxiety disorder, unspecified: Secondary | ICD-10-CM

## 2023-02-24 DIAGNOSIS — F32A Depression, unspecified: Secondary | ICD-10-CM

## 2023-02-26 ENCOUNTER — Other Ambulatory Visit: Payer: Self-pay

## 2023-03-05 ENCOUNTER — Observation Stay (HOSPITAL_COMMUNITY): Payer: MEDICAID

## 2023-03-05 ENCOUNTER — Observation Stay (HOSPITAL_COMMUNITY): Payer: MEDICAID | Admitting: Certified Registered"

## 2023-03-05 ENCOUNTER — Observation Stay (HOSPITAL_COMMUNITY): Payer: MEDICAID | Admitting: Orthopaedic Surgery

## 2023-03-05 ENCOUNTER — Observation Stay
Admission: RE | Admit: 2023-03-05 | Discharge: 2023-03-06 | Disposition: A | Discharge status: Home / Self Care | Payer: MEDICAID | Source: Ambulatory Visit | Attending: Orthopaedic Surgery | Admitting: Orthopaedic Surgery

## 2023-03-05 ENCOUNTER — Encounter (HOSPITAL_COMMUNITY): Payer: Self-pay | Admitting: Orthopaedic Surgery

## 2023-03-05 ENCOUNTER — Other Ambulatory Visit: Payer: Self-pay

## 2023-03-05 ENCOUNTER — Observation Stay (HOSPITAL_BASED_OUTPATIENT_CLINIC_OR_DEPARTMENT_OTHER): Payer: MEDICAID | Admitting: Certified Registered"

## 2023-03-05 ENCOUNTER — Encounter (HOSPITAL_COMMUNITY)
Admission: RE | Disposition: A | Discharge status: Home / Self Care | Payer: Self-pay | Source: Ambulatory Visit | Attending: Orthopaedic Surgery

## 2023-03-05 DIAGNOSIS — Z87891 Personal history of nicotine dependence: Secondary | ICD-10-CM | POA: Insufficient documentation

## 2023-03-05 DIAGNOSIS — G4733 Obstructive sleep apnea (adult) (pediatric): Secondary | ICD-10-CM | POA: Insufficient documentation

## 2023-03-05 DIAGNOSIS — Z79899 Other long term (current) drug therapy: Secondary | ICD-10-CM | POA: Insufficient documentation

## 2023-03-05 DIAGNOSIS — Z4789 Encounter for other orthopedic aftercare: Secondary | ICD-10-CM

## 2023-03-05 DIAGNOSIS — I130 Hypertensive heart and chronic kidney disease with heart failure and stage 1 through stage 4 chronic kidney disease, or unspecified chronic kidney disease: Secondary | ICD-10-CM | POA: Insufficient documentation

## 2023-03-05 DIAGNOSIS — M1612 Unilateral primary osteoarthritis, left hip: Secondary | ICD-10-CM

## 2023-03-05 DIAGNOSIS — F1911 Other psychoactive substance abuse, in remission: Secondary | ICD-10-CM | POA: Insufficient documentation

## 2023-03-05 DIAGNOSIS — F32A Depression, unspecified: Secondary | ICD-10-CM | POA: Insufficient documentation

## 2023-03-05 DIAGNOSIS — M169 Osteoarthritis of hip, unspecified: Secondary | ICD-10-CM | POA: Diagnosis present

## 2023-03-05 DIAGNOSIS — Z96642 Presence of left artificial hip joint: Secondary | ICD-10-CM

## 2023-03-05 DIAGNOSIS — E1122 Type 2 diabetes mellitus with diabetic chronic kidney disease: Secondary | ICD-10-CM | POA: Insufficient documentation

## 2023-03-05 DIAGNOSIS — N1831 Chronic kidney disease, stage 3a (CMS HCC): Secondary | ICD-10-CM | POA: Insufficient documentation

## 2023-03-05 DIAGNOSIS — I5040 Unspecified combined systolic (congestive) and diastolic (congestive) heart failure: Secondary | ICD-10-CM | POA: Insufficient documentation

## 2023-03-05 DIAGNOSIS — F419 Anxiety disorder, unspecified: Secondary | ICD-10-CM | POA: Insufficient documentation

## 2023-03-05 DIAGNOSIS — F1020 Alcohol dependence, uncomplicated: Secondary | ICD-10-CM | POA: Insufficient documentation

## 2023-03-05 DIAGNOSIS — G4730 Sleep apnea, unspecified: Secondary | ICD-10-CM | POA: Insufficient documentation

## 2023-03-05 HISTORY — PX: HX HIP REPLACEMENT: SHX124

## 2023-03-05 LAB — POC BLOOD GLUCOSE (RESULTS)
GLUCOSE, POC: 96 mg/dl (ref 70–105)
GLUCOSE, POC: 131 mg/dl — ABNORMAL HIGH (ref 70–105)
GLUCOSE, POC: 96 mg/dl (ref 70–105)
GLUCOSE, POC: 120 mg/dl — ABNORMAL HIGH (ref 70–105)
GLUCOSE, POC: 119 mg/dl — ABNORMAL HIGH (ref 70–105)

## 2023-03-05 LAB — TYPE AND SCREEN
ABO/RH(D): A POS
ANTIBODY SCREEN: NEGATIVE

## 2023-03-05 SURGERY — ARTHROPLASTY HIP TOTAL
Anesthesia: Spinal | Site: Hip | Laterality: Left | Wound class: Clean Wound: Uninfected operative wounds in which no inflammation occurred

## 2023-03-05 MED ORDER — BISACODYL 5 MG TABLET,DELAYED RELEASE
10.0000 mg | DELAYED_RELEASE_TABLET | Freq: Every day | ORAL | Status: DC | PRN
Start: 2023-03-05 — End: 2023-03-06

## 2023-03-05 MED ORDER — ONDANSETRON HCL 4 MG TABLET
4.0000 mg | ORAL_TABLET | Freq: Four times a day (QID) | ORAL | 0 refills | Status: DC | PRN
Start: 2023-03-05 — End: 2024-04-30
  Filled 2023-03-05: qty 20, 5d supply, fill #0

## 2023-03-05 MED ORDER — FENTANYL (PF) 50 MCG/ML INJECTION SOLUTION
INTRAMUSCULAR | Status: AC
Start: 2023-03-05 — End: 2023-03-05
  Filled 2023-03-05: qty 2

## 2023-03-05 MED ORDER — PHENYLEPHRINE 50 MG/250 ML (200 MCG/ML) IN 0.9 % SODIUM CHLORIDE IV
INTRAVENOUS | Status: DC | PRN
Start: 2023-03-05 — End: 2023-03-05
  Administered 2023-03-05: .2 ug/kg/min via INTRAVENOUS
  Administered 2023-03-05: 0 ug/kg/min via INTRAVENOUS
  Administered 2023-03-05 (×2): .4 ug/kg/min via INTRAVENOUS
  Administered 2023-03-05: .5 ug/kg/min via INTRAVENOUS

## 2023-03-05 MED ORDER — SODIUM CHLORIDE 0.9 % INTRAVENOUS SOLUTION
INTRAVENOUS | Status: DC
Start: 2023-03-05 — End: 2023-03-05
  Administered 2023-03-05: 0 mL via INTRAVENOUS

## 2023-03-05 MED ORDER — LACTATED RINGERS INTRAVENOUS SOLUTION
INTRAVENOUS | Status: DC
Start: 2023-03-05 — End: 2023-03-05
  Administered 2023-03-05: 0 via INTRAVENOUS

## 2023-03-05 MED ORDER — SODIUM CHLORIDE 0.9 % IRRIGATION SOLUTION
1000.0000 mL | Status: DC | PRN
Start: 2023-03-05 — End: 2023-03-05
  Administered 2023-03-05: 1000 mL

## 2023-03-05 MED ORDER — ETHYL ALCOHOL 62 % (NOZIN NASAL SANITIZER) NASAL SOLUTION - BULK BOTTLE
3.0000 | Freq: Once | NASAL | Status: AC
Start: 2023-03-05 — End: 2023-03-05
  Administered 2023-03-05: 3 via NASAL

## 2023-03-05 MED ORDER — PHENYLEPHRINE 1 MG/10 ML (100 MCG/ML) IN 0.9 % SOD.CHLORIDE IV SYRINGE
INJECTION | Freq: Once | INTRAVENOUS | Status: DC | PRN
Start: 2023-03-05 — End: 2023-03-05
  Administered 2023-03-05 (×2): 200 ug via INTRAVENOUS

## 2023-03-05 MED ORDER — SODIUM CHLORIDE 0.9 % (FLUSH) INJECTION SYRINGE
2.0000 mL | INJECTION | Freq: Three times a day (TID) | INTRAMUSCULAR | Status: DC
Start: 2023-03-05 — End: 2023-03-06
  Administered 2023-03-05: 2 mL
  Administered 2023-03-06: 0 mL

## 2023-03-05 MED ORDER — SENNOSIDES 8.6 MG-DOCUSATE SODIUM 50 MG TABLET
1.0000 | ORAL_TABLET | Freq: Two times a day (BID) | ORAL | Status: DC
Start: 2023-03-05 — End: 2023-03-06
  Administered 2023-03-05: 0 via ORAL
  Administered 2023-03-06: 1 via ORAL
  Filled 2023-03-05 (×2): qty 1

## 2023-03-05 MED ORDER — FENTANYL (PF) 50 MCG/ML INJECTION SOLUTION
Freq: Once | INTRAMUSCULAR | Status: DC | PRN
Start: 2023-03-05 — End: 2023-03-05
  Administered 2023-03-05 (×2): 50 ug via INTRAVENOUS

## 2023-03-05 MED ORDER — SODIUM CHLORIDE 0.9% FLUSH BAG - 250 ML
INTRAVENOUS | Status: DC | PRN
Start: 2023-03-05 — End: 2023-03-06

## 2023-03-05 MED ORDER — SENNOSIDES 8.6 MG-DOCUSATE SODIUM 50 MG TABLET
1.0000 | ORAL_TABLET | Freq: Two times a day (BID) | ORAL | 0 refills | Status: DC
Start: 2023-03-05 — End: 2024-04-30
  Filled 2023-03-05: qty 60, 30d supply, fill #0

## 2023-03-05 MED ORDER — MORPHINE 2 MG/ML INTRAVENOUS SYRINGE
2.0000 mg | INJECTION | INTRAVENOUS | Status: DC | PRN
Start: 2023-03-05 — End: 2023-03-06

## 2023-03-05 MED ORDER — ACETAMINOPHEN 1,000 MG/100 ML (10 MG/ML) INTRAVENOUS SOLUTION
1000.0000 mg | Freq: Once | INTRAVENOUS | Status: DC
Start: 2023-03-05 — End: 2023-03-05
  Administered 2023-03-05: 1000 mg via INTRAVENOUS
  Administered 2023-03-05: 0 mg via INTRAVENOUS
  Filled 2023-03-05: qty 100

## 2023-03-05 MED ORDER — PROPOFOL 10 MG/ML IV BOLUS
INJECTION | Freq: Once | INTRAVENOUS | Status: DC | PRN
Start: 2023-03-05 — End: 2023-03-05
  Administered 2023-03-05: 30 mg via INTRAVENOUS

## 2023-03-05 MED ORDER — POLYETHYLENE GLYCOL 3350 17 GRAM ORAL POWDER PACKET
17.0000 g | Freq: Every day | ORAL | Status: DC
Start: 2023-03-05 — End: 2023-03-06
  Administered 2023-03-05: 0 g via ORAL
  Administered 2023-03-06: 17 g via ORAL
  Filled 2023-03-05 (×2): qty 1

## 2023-03-05 MED ORDER — SODIUM CHLORIDE 0.9 % (FLUSH) INJECTION SYRINGE
2.0000 mL | INJECTION | INTRAMUSCULAR | Status: DC | PRN
Start: 2023-03-05 — End: 2023-03-06

## 2023-03-05 MED ORDER — ASPIRIN 81 MG TABLET,DELAYED RELEASE
81.0000 mg | DELAYED_RELEASE_TABLET | Freq: Two times a day (BID) | ORAL | Status: DC
Start: 2023-03-06 — End: 2023-03-06
  Administered 2023-03-06: 81 mg via ORAL
  Filled 2023-03-05: qty 1

## 2023-03-05 MED ORDER — ALBUMIN, HUMAN 5 % INTRAVENOUS SOLUTION
INTRAVENOUS | Status: DC | PRN
Start: 2023-03-05 — End: 2023-03-05
  Administered 2023-03-05: 0 via INTRAVENOUS

## 2023-03-05 MED ORDER — ONDANSETRON HCL (PF) 4 MG/2 ML INJECTION SOLUTION
4.0000 mg | Freq: Once | INTRAMUSCULAR | Status: DC
Start: 2023-03-05 — End: 2023-03-05

## 2023-03-05 MED ORDER — OXYCODONE 5 MG TABLET
1.0000 | ORAL_TABLET | ORAL | 0 refills | Status: DC | PRN
Start: 2023-03-05 — End: 2024-07-12
  Filled 2023-03-05: qty 42, 4d supply, fill #0

## 2023-03-05 MED ORDER — DEXTROSE 5% IN WATER (D5W) FLUSH BAG - 250 ML
INTRAVENOUS | Status: DC | PRN
Start: 2023-03-05 — End: 2023-03-06

## 2023-03-05 MED ORDER — DULOXETINE 30 MG CAPSULE,DELAYED RELEASE
30.0000 mg | DELAYED_RELEASE_CAPSULE | Freq: Every day | ORAL | Status: DC
Start: 2023-03-05 — End: 2023-03-06
  Administered 2023-03-05: 0 mg via ORAL
  Administered 2023-03-06: 30 mg via ORAL
  Filled 2023-03-05: qty 1

## 2023-03-05 MED ORDER — SODIUM CHLORIDE 0.9 % INTRAVENOUS SOLUTION
INTRAVENOUS | Status: DC | PRN
Start: 2023-03-05 — End: 2023-03-05
  Administered 2023-03-05: 0 via INTRAVENOUS

## 2023-03-05 MED ORDER — DEXAMETHASONE SODIUM PHOSPHATE (PF) 10 MG/ML INJECTION SOLUTION
10.0000 mg | Freq: Once | INTRAMUSCULAR | Status: DC
Start: 2023-03-05 — End: 2023-03-05

## 2023-03-05 MED ORDER — SACUBITRIL 49 MG-VALSARTAN 51 MG TABLET
1.0000 | ORAL_TABLET | Freq: Two times a day (BID) | ORAL | Status: DC
Start: 2023-03-06 — End: 2023-03-06
  Filled 2023-03-05 (×2): qty 1

## 2023-03-05 MED ORDER — PHENYLEPHRINE 1 MG/10 ML (100 MCG/ML) IN 0.9 % SOD.CHLORIDE IV SYRINGE
INJECTION | INTRAVENOUS | Status: AC
Start: 2023-03-05 — End: 2023-03-05
  Filled 2023-03-05: qty 10

## 2023-03-05 MED ORDER — NALOXONE 0.4 MG/ML INJECTION SOLUTION
0.4000 mg | Freq: Once | INTRAMUSCULAR | Status: DC | PRN
Start: 2023-03-05 — End: 2023-03-06

## 2023-03-05 MED ORDER — GLYCERIN (ADULT) RECTAL SUPPOSITORY
1.0000 | Freq: Every day | RECTAL | Status: DC | PRN
Start: 2023-03-05 — End: 2023-03-06

## 2023-03-05 MED ORDER — DIPHENHYDRAMINE 50 MG/ML INJECTION SOLUTION
25.0000 mg | Freq: Four times a day (QID) | INTRAMUSCULAR | Status: DC | PRN
Start: 2023-03-05 — End: 2023-03-06

## 2023-03-05 MED ORDER — CEFAZOLIN 1 GRAM SOLUTION FOR INJECTION
INTRAMUSCULAR | Status: AC
Start: 2023-03-05 — End: 2023-03-05
  Filled 2023-03-05: qty 10

## 2023-03-05 MED ORDER — OXYCODONE 5 MG TABLET
5.0000 mg | ORAL_TABLET | ORAL | Status: DC | PRN
Start: 2023-03-05 — End: 2023-03-06

## 2023-03-05 MED ORDER — PROPOFOL 10 MG/ML INTRAVENOUS EMULSION
INTRAVENOUS | Status: DC | PRN
Start: 2023-03-05 — End: 2023-03-05
  Administered 2023-03-05: 0 ug/kg/min via INTRAVENOUS
  Administered 2023-03-05: 100 ug/kg/min via INTRAVENOUS
  Administered 2023-03-05: 60 ug/kg/min via INTRAVENOUS
  Administered 2023-03-05: 65 ug/kg/min via INTRAVENOUS
  Administered 2023-03-05: 125 ug/kg/min via INTRAVENOUS
  Administered 2023-03-05: 50 ug/kg/min via INTRAVENOUS
  Administered 2023-03-05 (×3): 75 ug/kg/min via INTRAVENOUS

## 2023-03-05 MED ORDER — SODIUM CHLORIDE 0.9 % IRRIGATION SOLUTION
Freq: Once | Status: DC | PRN
Start: 2023-03-05 — End: 2023-03-05

## 2023-03-05 MED ORDER — ASPIRIN 81 MG TABLET,DELAYED RELEASE
81.0000 mg | DELAYED_RELEASE_TABLET | Freq: Two times a day (BID) | ORAL | 0 refills | Status: DC
Start: 2023-03-05 — End: 2024-07-12
  Filled 2023-03-05: qty 60, 30d supply, fill #0

## 2023-03-05 MED ORDER — ETHYL ALCOHOL 62 % (NOZIN NASAL SANITIZER) NASAL SOLUTION - BULK BOTTLE
1.0000 | Freq: Three times a day (TID) | NASAL | Status: DC
Start: 2023-03-05 — End: 2023-03-06
  Administered 2023-03-05 – 2023-03-06 (×3): 1 via NASAL

## 2023-03-05 MED ORDER — DEXTROSE 5 % IN WATER (D5W) INTRAVENOUS SOLUTION
2.0000 g | Freq: Once | INTRAVENOUS | Status: AC
Start: 2023-03-05 — End: 2023-03-05
  Administered 2023-03-05: 2 g via INTRAVENOUS
  Filled 2023-03-05: qty 10

## 2023-03-05 MED ORDER — EPHEDRINE SULFATE 5 MG/ML INTRAVENOUS SOLUTION
INTRAVENOUS | Status: AC
Start: 2023-03-05 — End: 2023-03-05
  Filled 2023-03-05: qty 10

## 2023-03-05 MED ORDER — ACETAMINOPHEN 325 MG TABLET
650.0000 mg | ORAL_TABLET | Freq: Four times a day (QID) | ORAL | Status: DC
Start: 2023-03-06 — End: 2023-03-06
  Administered 2023-03-05 – 2023-03-06 (×3): 650 mg via ORAL
  Filled 2023-03-05: qty 2

## 2023-03-05 MED ORDER — ONDANSETRON HCL (PF) 4 MG/2 ML INJECTION SOLUTION
4.0000 mg | Freq: Four times a day (QID) | INTRAMUSCULAR | Status: AC | PRN
Start: 2023-03-05 — End: 2023-03-06

## 2023-03-05 MED ORDER — SODIUM CHLORIDE 0.9 % INTRAVENOUS SOLUTION
INTRAVENOUS | Status: AC
Start: 2023-03-05 — End: 2023-03-05

## 2023-03-05 MED ORDER — SODIUM CHLORIDE 0.9 % (FLUSH) INJECTION SYRINGE
2.0000 mL | INJECTION | Freq: Three times a day (TID) | INTRAMUSCULAR | Status: DC
Start: 2023-03-05 — End: 2023-03-06
  Administered 2023-03-05: 0 mL
  Administered 2023-03-06: 2 mL

## 2023-03-05 MED ORDER — APREPITANT 40 MG CAPSULE
40.0000 mg | ORAL_CAPSULE | Freq: Once | ORAL | Status: AC
Start: 2023-03-05 — End: 2023-03-05
  Administered 2023-03-05: 40 mg via ORAL
  Filled 2023-03-05: qty 1

## 2023-03-05 MED ORDER — MORPHINE 4 MG/ML INTRAVENOUS SOLUTION
4.0000 mg | INTRAVENOUS | Status: DC | PRN
Start: 2023-03-05 — End: 2023-03-06

## 2023-03-05 MED ORDER — PROPOFOL 10 MG/ML INTRAVENOUS EMULSION
INTRAVENOUS | Status: AC
Start: 2023-03-05 — End: 2023-03-05
  Filled 2023-03-05: qty 50

## 2023-03-05 MED ORDER — DEXMEDETOMIDINE 4 MCG/ML IV DILUTION
Freq: Once | INTRAMUSCULAR | Status: DC | PRN
Start: 2023-03-05 — End: 2023-03-05
  Administered 2023-03-05: 12 ug via INTRAVENOUS

## 2023-03-05 MED ORDER — SODIUM CHLORIDE 0.9 % IRRIGATION SOLUTION
3000.0000 mL | Status: DC | PRN
Start: 2023-03-05 — End: 2023-03-05

## 2023-03-05 MED ORDER — KETOROLAC 30 MG/ML (1 ML) INJECTION SOLUTION
Freq: Once | INTRAMUSCULAR | Status: AC
Start: 2023-03-05 — End: 2023-03-05
  Filled 2023-03-05: qty 60

## 2023-03-05 MED ORDER — INSULIN LISPRO 100 UNIT/ML SUB-Q SSIP
0.0000 [IU] | INJECTION | Freq: Four times a day (QID) | SUBCUTANEOUS | Status: DC | PRN
Start: 2023-03-05 — End: 2023-03-06

## 2023-03-05 MED ORDER — PROCHLORPERAZINE EDISYLATE 10 MG/2 ML (5 MG/ML) INJECTION SOLUTION
10.0000 mg | Freq: Four times a day (QID) | INTRAMUSCULAR | Status: DC | PRN
Start: 2023-03-05 — End: 2023-03-05

## 2023-03-05 MED ORDER — METOPROLOL SUCCINATE ER 25 MG TABLET,EXTENDED RELEASE 24 HR
25.0000 mg | ORAL_TABLET | Freq: Every day | ORAL | Status: DC
Start: 2023-03-06 — End: 2023-03-06
  Administered 2023-03-06: 25 mg via ORAL
  Filled 2023-03-05: qty 1

## 2023-03-05 MED ORDER — EPHEDRINE SULFATE 5 MG/ML INTRAVENOUS SOLUTION
Freq: Once | INTRAVENOUS | Status: DC | PRN
Start: 2023-03-05 — End: 2023-03-05
  Administered 2023-03-05 (×5): 10 mg via INTRAVENOUS

## 2023-03-05 MED ORDER — LACTULOSE 20 GRAM/30 ML ORAL SOLUTION
30.0000 mL | Freq: Four times a day (QID) | ORAL | Status: DC | PRN
Start: 2023-03-05 — End: 2023-03-06

## 2023-03-05 MED ORDER — TRAZODONE 50 MG TABLET
100.0000 mg | ORAL_TABLET | Freq: Every evening | ORAL | Status: DC
Start: 2023-03-05 — End: 2023-03-06
  Administered 2023-03-05: 100 mg via ORAL
  Filled 2023-03-05: qty 2

## 2023-03-05 MED ORDER — TRANEXAMIC ACID 1,000 MG/100 ML(10 MG/ML)IN SOD CHLOR,ISO IV PIGGYBACK
1000.0000 mg | INJECTION | Freq: Once | INTRAVENOUS | Status: AC
Start: 2023-03-05 — End: 2023-03-05
  Administered 2023-03-05: 1000 mg via INTRAVENOUS

## 2023-03-05 MED ORDER — DEXTROSE 5 % IN WATER (D5W) INTRAVENOUS SOLUTION
2.0000 g | Freq: Three times a day (TID) | INTRAVENOUS | Status: AC
Start: 2023-03-05 — End: 2023-03-06
  Administered 2023-03-05: 2 g via INTRAVENOUS
  Administered 2023-03-05: 0 g via INTRAVENOUS
  Administered 2023-03-06: 2 g via INTRAVENOUS
  Administered 2023-03-06: 0 g via INTRAVENOUS
  Filled 2023-03-05 (×2): qty 10

## 2023-03-05 MED ORDER — PROCHLORPERAZINE EDISYLATE 10 MG/2 ML (5 MG/ML) INJECTION SOLUTION
10.0000 mg | Freq: Four times a day (QID) | INTRAMUSCULAR | Status: DC | PRN
Start: 2023-03-05 — End: 2023-03-06

## 2023-03-05 MED ORDER — SPIRONOLACTONE 25 MG TABLET
25.0000 mg | ORAL_TABLET | Freq: Every morning | ORAL | Status: DC
Start: 2023-03-06 — End: 2023-03-06

## 2023-03-05 MED ORDER — OXYCODONE 10 MG TABLET
10.0000 mg | ORAL_TABLET | ORAL | Status: DC | PRN
Start: 2023-03-05 — End: 2023-03-06
  Administered 2023-03-05 – 2023-03-06 (×4): 10 mg via ORAL
  Filled 2023-03-05 (×4): qty 1

## 2023-03-05 MED ORDER — ONDANSETRON HCL (PF) 4 MG/2 ML INJECTION SOLUTION
INTRAMUSCULAR | Status: AC
Start: 2023-03-05 — End: 2023-03-05
  Filled 2023-03-05: qty 2

## 2023-03-05 SURGICAL SUPPLY — 26 items
ADH SKNCLS CYNCRLT EXOFIN HVSC STRL TISS LF  DISP 1G (MED SURG SUPPLIES) ×1 IMPLANT
BAG ICE GEL (MED SURG SUPPLIES) ×2 IMPLANT
BLADE SAW 77.6X11.2MM RECIPROCATE OFST HVDTY SS THK.77MM LONG STRL LF  DISP (SURGICAL CUTTING SUPPLIES) ×1 IMPLANT
BLADE SAW 90X18MM SGTL THK1.27MM STRL (SURGICAL CUTTING SUPPLIES) IMPLANT
BLANKET MDTHR MUL-T-BLNKT PED 64X25IN PLMR FBRC 2 SD COLD 2 FEMALE CONN NWVN WARM NONST LF  DISP CLR (MED SURG SUPPLIES) ×1 IMPLANT
BLANKET MUL-T-BLNKT ADULT 64X25IN COLD CONN WARM NONST LF  REUSE (MED SURG SUPPLIES) ×1 IMPLANT
CONV USE 338605 - PACK SURG T/H ARTHPL NONST DISP LF (CUSTOM TRAYS & PACK) ×1 IMPLANT
COVER POSITION WHT HIPGRIP FBRC SET UPRT (MED SURG SUPPLIES) ×1 IMPLANT
DEVICE CLSR VLOC 180 45CM ABS 2-0 GS-21 37MM GRN (SUTURE/WOUND CLOSURE) ×1 IMPLANT
DEVICE CLSR VLOC 90 58CM PREM RVRS 2 ANG CUT ABS BARB PTRN SURGALLOY 4-0 P-12 3/8 CRC UNDYED (SUTURE/WOUND CLOSURE) ×1 IMPLANT
DRESS 20X10CM HDRCL SHOWERPROOF 3 ACT SIL ALVN GNTL BRDR FOAM (WOUND CARE SUPPLY) ×1 IMPLANT
DRILL SURG 25MM FLXB SCREW (SURGICAL CUTTING SUPPLIES) IMPLANT
GOWN SURG XL XLNG LGTH AAMI L4 BRTHBL HKLP CLSR LINT RST STRL AERO CR SMS FILM (DRAPE/PACKS/SHEETS/OR TOWEL) ×1 IMPLANT
HEAD ECHLN 36MM +4MM 12/14 TAPER HIP OXNM FEM STRL LF (IMPLANTS HIP) ×1 IMPLANT
HEMOSTAT ABS BIONERT ARISTA AH MPH PLNT STRH 5GM STRL ×1 IMPLANT
HIP POR FEM OX HD R3 SHELL_R3 XLPE LN 71703318 (IMPLANTS HIP) ×1 IMPLANT
HOOD SRG FLYTE SURGICOOL PLWY STRL LF  DISP (GLOVES AND ACCESSORIES) ×1 IMPLANT
NEEDLE SPINAL PNK 6IN 18GA QUINCKE LONG LGTH REG WL POLYPROP QUINCKE TIP STRL LF  DISP (MED SURG SUPPLIES) ×1 IMPLANT
PACK SURG T/H ARTHPL NONST DISP LF (CUSTOM TRAYS & PACK) ×1
SCREW BONE REFLC 35MM SPHRCL HEAD (IMPLANTS HIP) ×1 IMPLANT
SCREW BONE REFLC R3 CONTOUR 6.5MM 30MM SPHRCL HIP ACET CANC (IMPLANTS HIP) ×1 IMPLANT
SHELL ACET 56MM HIP 3 H POLY POR R3 STD STRL (IMPLANTS HIP) ×1 IMPLANT
SOL IRRG 0.9% NACL 3L PLASTIC CONTAINR UROMATIC LF (MEDICATIONS/SOLUTIONS) ×1 IMPLANT
SOL SURG PREP 26ML DRPRP 74% ISPRP 0.7% IOD POVACRYLEX SLF CNTN APPL SKIN STRL PREOP (MED SURG SUPPLIES) ×1 IMPLANT
STEM FEM SYNERGY 155MM PRIM DIST BLT TIP TI POR HIP 13 HI OFST TAPER STRL (IMPLANTS HIP) ×1 IMPLANT
WRAP THRP HIP COLD SMI (MED SURG SUPPLIES) ×1 IMPLANT

## 2023-03-05 NOTE — Anesthesia Preprocedure Evaluation (Signed)
ANESTHESIA PRE-OP EVALUATION  Planned Procedure: ARTHROPLASTY HIP TOTAL (Left: Hip)  Review of Systems         patient summary reviewed  nursing notes reviewed        Pulmonary   sleep apnea,   Cardiovascular    ECG reviewed ,       GI/Hepatic/Renal           Endo/Other    osteoarthritis,      Neuro/Psych/MS        Cancer                        Physical Assessment      Airway       Mallampati: II      Mouth Opening: good.            Dental                    Pulmonary      (-) no stridor     Cardiovascular    Rhythm: regular         Other findings              Plan  ASA 3     Planned anesthesia type: spinal                         Intravenous induction     Anesthesia issues/risks discussed are: PONV, Sore Throat and Aspiration.  Anesthetic plan and risks discussed with patient  signed consent obtained          Patient's NPO status is appropriate for Anesthesia.           (Patient has obstructive sleep apnea - diagnosed recently - no therapy yet.)

## 2023-03-05 NOTE — OR Surgeon (Signed)
PATIENT NAMESNYDER, Leonard [name]   HOSPITAL NUMBER:  [id number]  DATE OF SERVICE: 03/05/2023  DATE OF BIRTH:  September 27, 1971    OPERATIVE REPORT    PREOPERATIVE DIAGNOSIS:  Severe arthritis, left hip.    POSTOPERATIVE DIAGNOSIS:  Severe arthritis, left hip.    NAME OF PROCEDURE:  Left total hip replacement.    SURGEON:  Jeanell Sparrow, MD.    ASSISTANT:  1. Cain Saupe, MD.  2. Waldemar Dickens, APRN, NP-C.  3. Veatrice Kells, PA-C.    ANESTHESIA:  Surgical spinal.    INDICATIONS FOR PROCEDURE:  [name] is a 52 year old gentleman with persistent unremitting left hip pain and severe arthritis.  He has failed conservative treatment consisting of NSAIDs, injections and activity modification.  Total hip replacement surgery was offered.  Risks were reviewed.  Consent for the procedure was provided.  Clearance was provided.    DESCRIPTION OF PROCEDURE:  The patient was taken to the operating room on Mar 05, 2023.  He underwent surgical spinal with bupivacaine.  A preoperative antibiotic was administered.  The patient was then positioned in the lateral decubitus position with the operative hip up.  A time-out was performed to ensure the correct side.  The operative extremity was then degreased with alcohol, prepped with DuraPrep and draped in the sterile fashion.  A timeout was repeated.  A curvilinear incision was created over the lateral hip.  Dissection was carried down to fascia using sharp dissection.  Bleeding points were identified and cauterized.  The fascia was then split along the length of the incision.  A self-retaining retractor was inserted.  The short external rotators were detached and tagged for later repair.  The hip capsule was opened and each corner tagged for later repair.  The femoral neck was exposed.  The hip was dislocated.  The femoral neck was osteotomized according to preoperative templating.  An anterior capsulotomy was performed to enhance exposure.  The acetabulum was then exposed  in a circumferential fashion.  The acetabular labrum was excised.  The acetabular teardrop was identified.  The acetabulum was then sequentially reamed with progressively larger reamers until a hemispherical bed of bleeding bone was encountered.  Satisfied with this, a trial acetabular component was impacted and found to be stable.  The final acetabular implant was selected and impacted into place.  Satisfied with the press-fit fixation, an appropriate-sized highly cross-linked polyethylene bearing was impacted into place and its stability assessed.  Satisfied with the acetabulum, attention was then turned to femoral preparation.  The femoral medullary canal was opened with a box-cutting chisel and T-handled canal finder.  The canal was then sequentially broached until axial and rotational stability was achieved.  Trial reductions were performed with various combinations of neck length and offset.  Once satisfied with stability, range of motion and leg length restoration, trial components were removed.  The final implant was selected and impacted down to the level of the calcar.  Trial reduction was repeated with various length neck trials.  The final component was selected and inserted onto a clean and dry Morse taper.  The hip was reduced.  The wound was lavaged with pulsatile irrigation.  The fascia was closed with a running #2 Quill suture and 0 Vicryl suture.  The subcutaneous tissue was approximated with 2-0 Vicryl suture.  The skin was closed with a running 4-0 V-Loc suture followed by Dermabond.  Dressings were applied.  The patient was awakened, extubated, and taken to recovery in satisfactory  condition.    ESTIMATED BLOOD LOSS:  800 mL.    COMPLICATIONS:  None.    SPECIMENS:  None.    FINDINGS:  Severe arthritis, left hip, as evidenced by osteophytosis and bony sclerosis.  In addition, there was anterior acetabular bone loss that was present before we started reaming and not iatrogenic.    FINAL COMPONENTS  IMPLANTED:  Smith and Nephew size 56 R3 acetabular component with two 6.5 mm.  Acetabular screws and a neutral 36 mm polyethylene liner, size 13 high offset Synergy cementless femoral stem with a +4 x 36 oxidized zirconium femoral head.        Jeanell Sparrow, MD  Associate Professor  [facility] Department of Orthopaedics         Hermenia Fiscal, MD  [address]  [place], [place] [address]       DD:  03/05/2023 19:07:13  DT:  03/05/2023 19:22:18 FP  D#:  [id number]

## 2023-03-05 NOTE — Progress Notes (Addendum)
[facility]  Status post left Procedure(s) (LRB):  ARTHROPLASTY HIP TOTAL (Left)     Exam  BP 109/83   Pulse 63   Temp 36.4 C (97.6 F)   Resp 15   Ht 1.829 m (6')   Wt 107 kg (234 lb 12.6 oz)   SpO2 94%   BMI 31.84 kg/m       Well appearing, Alert and oriented, No apparent distress, and Calm   Normal pedal pulses, Foot warm and well perfused, and Peroneal and tibial nerve intact    Labs    Hemogram   Lab Results   Component Value Date/Time    WBC 8.9 02/18/2023 01:45 PM    HGB 12.1 (L) 02/18/2023 01:45 PM    HCT 38.5 (L) 02/18/2023 01:45 PM    PLTCNT 179 02/18/2023 01:45 PM    RBC 4.10 (L) 02/18/2023 01:45 PM    MCV 93.9 02/18/2023 01:45 PM    MCHC 31.4 02/18/2023 01:45 PM    MCH 29.5 02/18/2023 01:45 PM    MPV 10.7 02/18/2023 01:45 PM        Lab Results   Component Value Date    SODIUM 139 02/18/2023    POTASSIUM 5.1 02/18/2023    CHLORIDE 106 02/18/2023    CO2 26 02/18/2023    ANIONGAP 7 02/18/2023    BUN 36 (H) 02/18/2023    CREATININE 1.42 (H) 02/18/2023    BUNCRRATIO 25 (H) 02/18/2023    GFR 59 (L) 02/18/2023        X-rays  Post-op x-rays show a well aligned total Hip replacement without evidence of post operative fracture    Plan  Weight bearing status: weight bearing as tolerated  Restrictions/precautions: Posterior hip precautions  VTE prophylaxis: twice daily baby aspirin  Antibiotics: Ancef  Disposition: home with family  Expected length of stay: 1 night    Veatrice Kells, PA-C  03/05/2023, 13:10      The patient's condition and plan of care was discussed with me.  The note above was personally reviewed and appended before signature.    Kathee Delton MD  Associate Professor of Orthopaedic Surgery  Ph   [phone]  Fax [phone]  Pager [phone]  03/05/2023 18:24

## 2023-03-05 NOTE — Nurses Notes (Signed)
Pt admitted to 7ne bed 14. Pt had spinal anesthesia, pt with numbness to left leg. Pt with moderate dorsi/plantar flexion bilaterally. See flow sheet for full assessment.

## 2023-03-05 NOTE — Care Plan (Signed)
[facility]  Rehabilitation Services  Physical Therapy Initial Evaluation    Patient Name: Leonard [name]  Date of Birth: Mar 03, 1971  Height: Height: 182.9 cm (6' 0.01")  Weight: Weight: 108 kg (238 lb 8.6 oz)  Room/Bed: 14/A  Payor: [employer] - [place] / Plan: [employer] - [place] / Product Type: Medicaid [place] /     Assessment:      (P) Patient unsafe for ambulation at this time due to continued numbness of LLE. Will attempt mobility tomorrow am. Anticipate discharge to home when medically ready.    Discharge Needs:    Equipment Recommendation: (P) front wheeled walker      The patient presents with mobility limitations due to impaired balance that significantly impair/prevent patient's ability to participate in mobility-related activities of daily living (MRADLs) including  ambulation and transfers in order to safely complete, toileting, bathing, food preparation, laundering/household tasks, safely entering/exiting the home. This functional mobility deficit can be sufficiently resolved with the use of a (P) front wheeled walker  in order to decrease the risk of falls, morbidity, and mortality in performance of these MRADLs.  Patient is able to safely use this assistive device.    Discharge Disposition: (P) home with assist    Plan:   Current Intervention: (P) balance training, bed mobility training, gait training, transfer training, stair training  To provide physical therapy services (P) minimum of 3x/week  for duration of (P) until discharge.    The risks/benefits of therapy have been discussed with the patient/caregiver and he/she is in agreement with the established plan of care.       Subjective & Objective        03/05/23 1850   Therapist Pager   PT Assigned/ Pager # Vilma Prader [phone]   Rehab Session   Document Type evaluation   PT Visit Date 03/05/23   Total PT Minutes: 20   Patient Effort good   Symptoms Noted During/After Treatment none   General Information   Patient Profile Reviewed yes    Onset of Illness/Injury or Date of Surgery 03/05/23   Patient/Family/Caregiver Comments/Observations cooperative   Pertinent History of Current Functional Problem s/p Left THA   Medical Lines PIV Line   Existing Precautions/Restrictions fall precautions;posterior hip precautions;weight bearing restriction   Weight-bearing Status   Extremity Weight-bearing Status left lower extremity   Left Lower Extremity weight-bearing as tolerated (WBAT)   Mutuality/Individual Preferences   Individualized Care Needs ambulate using FWW with assist x 1-2 when numbness resolves   Living Environment   Lives With sibling(s)   Living Arrangements house   Home Assessment: No Problems Identified   Home Accessibility stairs to enter home   Home Main Entrance   Number of Stairs, Main Entrance five   Functional Level Prior   Ambulation 0 - independent   Transferring 0 - independent   Pre Treatment Status   Pre Treatment Patient Status Patient supine in bed   Support Present Pre Treatment  None   Communication Pre Treatment  Nurse   Cognitive Assessment/Interventions   Behavior/Mood Observations cooperative   Orientation Status oriented x 4   Attention WNL/WFL   Follows Commands WNL   Pain Assessment   Pretreatment Pain Rating 5/10   Posttreatment Pain Rating 5/10   Pain Location - Side Left   Pain Location - Orientation lower   Pain Location extremity   Pre/Posttreatment Pain Comment has been medicated   RLE Assessment   RLE Assessment WFL for stated baseline  LLE Assessment   LLE Assessment X-Exceptions   LLE ROM WFL while maintaining hip precautions   LLE Strength not tested   Bed Mobility Assessment/Treatment   Bed Mobility, Assistive Device bed rails;Head of Bed Elevated   Supine-Sit Independence minimum assist (75% patient effort)   Sit to Supine, Independence minimum assist (75% patient effort)   Safety Issues decreased use of legs for bridging/pushing   Impairments balance impaired;endurance;pain;ROM decreased;strength decreased    Transfer Assessment/Treatment   Sit-Stand Independence minimum assist (75% patient effort)   Stand-Sit Independence minimum assist (75% patient effort)   Sit-Stand-Sit, Assist Device walker, front wheeled   Transfer Safety Issues balance decreased during turns;step length decreased;weight-shifting ability decreased   Transfer Impairments balance impaired;endurance;pain;ROM decreased;strength decreased   Gait Assessment/Treatment   Comment While standing patient continues to feel unsteady due to numbness in LLE. Unsafe for ambulation at this time.   Balance Skill Training   Comment FWW in standing   Sitting Balance: Static good balance   Sitting, Dynamic (Balance) good balance   Sit-to-Stand Balance fair - balance   Standing Balance: Static fair - balance   Standing Balance: Dynamic fair - balance   Systems Impairment Contributing to Balance Disturbance musculoskeletal   Identified Impairments Contributing to Balance Disturbance decreased ROM;decreased strength;decreased sensation;pain   Post Treatment Status   Post Treatment Patient Status Patient supine in bed   Support Present Post Treatment  None   Communication Post Treatement Nurse   Plan of Care Review   Plan Of Care Reviewed With patient   Basic Mobility Am-PAC/6Clicks Score (APPROVED Staff)   Turning in bed without bedrails 3   Lying on back to sitting on edge of flat bed 3   Moving to and from a bed to a chair 3   Standing up from chair 3   Walk in room 1   Climbing 3-5 steps with railing 1   6 Clicks Raw Score total 14   Standardized (t-scale) score 35.55   Patient Mobility Goal (JHHLM) 3- Sit at edge of bed 3X/day   Exercise/Activity Level Performed 3- Sat at edge of bed   Physical Therapy Clinical Impression   Assessment Patient unsafe for ambulation at this time due to continued numbness of LLE. Will attempt mobility tomorrow am. Anticipate discharge to home when medically ready.   Patient/Family Goals Statement none stated   Criteria for Skilled  Therapeutic meets criteria;skilled treatment is necessary   Pathology/Pathophysiology Noted musculoskeletal   Impairments Found (describe specific impairments) aerobic capacity/endurance;gait, locomotion, and balance   Functional Limitations in Following  self-care;home management   Disability: Inability to Perform community/leisure   Rehab Potential good   Therapy Frequency minimum of 3x/week   Predicted Duration of Therapy Intervention (days/wks) until discharge   Anticipated Equipment Needs at Discharge (PT) front wheeled walker   Anticipated Discharge Disposition home with assist   Care Plan Goals   PT Rehab Goals Gait Training Goal;Transfer Training Goal   Gait Training  Goal, Distance to Achieve   Gait Training  Goal, Date Established 03/05/23   Gait Training  Goal, Time to Achieve by discharge   Gait Training  Goal, Independence Level modified independence   Gait Training  Goal, Assist Device walker, rolling   Gait Training  Goal, Distance to Achieve 250   Transfer Training Goal   Transfer Training Goal, Date Established 03/05/23   Transfer Training Goal, Time to Achieve by discharge   Transfer Training Goal, Activity Type sit-to-stand/stand-to-sit   Transfer Training  Goal, Independence Level modified independence   Transfer Training Goal, Assist Device walker, rolling   Planned Therapy Interventions, PT Eval   Planned Therapy Interventions (PT) balance training;bed mobility training;gait training;transfer training;stair training       Therapist:   Serafina Royals, PT   Pager #: [phone]

## 2023-03-05 NOTE — Progress Notes (Signed)
Ortho Service: Ortho Joint   Ortho Attending/Surgeon: Dr. Graciela Husbands, MD  Date of Service: 03/05/2023  Procedure(s), Procedure Date(s) and Post Op Day:    Procedure(s):  ARTHROPLASTY HIP TOTAL    03/05/2023    Day of Surgery  -------------------     Subjective: Patient resting in bed this afternoon and endorses pain tolerable at this time. Denies any chest pain, nausea, or SOB. Endorses some residual n/t in b/l LE's in setting of recent spinal. Educated on gel packs. Encouraged to ambulate with walker TID while in the hospital and every hour during the day on discharge. Will need FWW on discharge.    Objective:  Blood pressure 100/64, pulse 64, temperature 36.5 C (97.7 F), resp. rate 18, height 1.829 m (6'), weight 107 kg (234 lb 12.6 oz), SpO2 97%.    Physical Exam:  Gen: Awake, Alert & Oriented  HEENT: PERRLA, EOMI  ABD: soft, non-tender   MSK:   Skin is pink warm and dry in the LLE  Surgical dressing: CDI  Compartments:  Soft  Sensation: Intact  Motor: Wiggles toes, + DF and PF  Pulses: 2    Antibiotics (last 24 hours)       Date/Time Action Medication Dose    03/05/23 1027 New Bag/New Syringe    ceFAZolin (ANCEF) 2 g in D5W 50 mL IVPB 2 g             Anticoagulants/Antiplatelets (last 24 hours)       None             PLAN:  - Weightbearing: WBAT  - PT/OT: ordered; awaiting recs  - DVT prophylaxis: SCDs; ASA 81 mg BID x 30 days starting 03/06/23  - Antibiotics: Ancef  - Micro: None  - Pain: PO Oxycodone PRN for moderate to severe pain; IV morphine for breakthrough pain  - Drain: None  - Dressing: located left posterior hip, ortho to manage  - Foley: SC x1 in PACU  - Labs: POD1 AM labs ordered  - Imaging: postop xray   - Diet: Regular   - No data recorded   - Encourage OOB with assistance. Meals in chair.  - Further Ortho recs: Posterior Precautions, stop IVMF at midnight   - Dispo: Likely home with family after 1 night stay   - Follow-up: will see back in clinic in 2-3 weeks     --    Pati Gallo,  APRN,NP-C  Department of Orthopaedic Surgery  Pager: SPOK  Phone: [phone] (M-F 9am-5pm)     03/05/2023 14:29     The patient's condition and plan of care was discussed with me.  The note above was personally reviewed and appended before signature.    Kathee Delton MD  Associate Professor of Orthopaedic Surgery  Ph   [phone]  Fax [phone]  Pager [phone]  03/05/2023 18:24

## 2023-03-05 NOTE — Anesthesia Procedure Notes (Addendum)
Leonard [name]    Neuraxial Block    Performed by:   Performing Provider: Grayland Jack, DO   Authorizing provider: Grayland Jack, DO  I was present and supervised/observed the entire procedure.  Grayland Jack, DO 03/05/2023, 11:58  Sedation        Blocks   Block: spinal   Type of block: single shot   Indication:primary anesthetic   Diagnosis: hip pain      Technique:  Pt location: In OR  Approach: midline        Preprocedure hand washing was performed sterile field maintained   Needle level: L3-4  Skin Prep: povidone-iodine  and Sterile technique, Mask, Cap, Sterile drape, Sterile gloves, Sterile field established, Sterilely prepped and draped and Hand hygiene performed   Preanesthesia Checklist:  Pre anesthesia checklist: site marked, surgical consent, monitors and equipment checked, timeout performed, anesthesia consent, emergency drugs available and Pateint positioned  Position:     Skin Local:  Skin local: Lidocaine 1%   Spinal Needle:  Spinal needle:Quinke    Spinal Needle gauge: 22 G       Epidural Needle:                Epidural Catheter:              Block Events:     Test dose:                Medications    Dosing      Patient response adequate sensory block and comfortable.    NOTES  Block free text:Mepivacaine 2% - 3.34ml = 70mg 

## 2023-03-05 NOTE — Anesthesia Transfer of Care (Signed)
ANESTHESIA TRANSFER OF CARE   Leonard [name] is a 52 y.o. ,male, Weight: 107 kg (234 lb 12.6 oz)   had Procedure(s) with comments:  ARTHROPLASTY HIP TOTAL - LAS  performed  03/05/23   Primary Service: Jeanell Sparrow, *    Past Medical History:   Diagnosis Date    Anxiety     Depression     Diabetes mellitus type II, non insulin dependent (CMS HCC)     Diverticulosis of colon     GERD (gastroesophageal reflux disease)     Heart failure (CMS HCC)     Hip pain     HTN (hypertension)     Low testosterone     Osteoarthritis     Ritalin use disorder, severe (CMS HCC)     Severe alcohol use disorder (CMS HCC)     Severe benzodiazepine use disorder (CMS HCC)     Sleep apnea     Tobacco abuse     Type 2 diabetes mellitus (CMS HCC)       Allergy History as of 03/05/23       CAT DANDER         Noted Status Severity Type Reaction    01/06/23 1407 Arnell Asal, Ambulatory Care Assistant 01/06/23 Active Medium Intolerance Rash, Itching, Swelling                  I completed my transfer of care / handoff to the receiving personnel during which we discussed:  Access, Airway, All key/critical aspects of case discussed, Analgesia, Antibiotics, Expectation of post procedure, Fluids/Product, Gave opportunity for questions and acknowledgement of understanding, Labs and PMHx      Post Location: PACU                        Additional Info:Tx to PACU;  Report to RN;   VSS                                     Last OR Temp: Temperature: 36.9 C (98.4 F)  ABG:  POTASSIUM   Date Value Ref Range Status   02/18/2023 5.1 3.5 - 5.1 mmol/L Final     KETONES   Date Value Ref Range Status   07/10/2022 Negative Negative mg/dL Final     CALCIUM   Date Value Ref Range Status   02/18/2023 9.9 8.6 - 10.2 mg/dL Final     Comment:     Gadolinium-containing contrast can interfere with calcium measurement.     Calculated P Axis   Date Value Ref Range Status   11/13/2022 76 degrees Final     Calculated R Axis   Date Value Ref Range Status   11/13/2022 66  degrees Final     Calculated T Axis   Date Value Ref Range Status   11/13/2022 78 degrees Final     Airway:* No LDAs found *  Blood pressure 104/65, pulse 84, temperature 36.9 C (98.4 F), resp. rate (!) 21, height 1.829 m (6'), weight 107 kg (234 lb 12.6 oz), SpO2 93%.

## 2023-03-05 NOTE — Brief Op Note (Addendum)
[facility]                                                     BRIEF OPERATIVE NOTE    Patient Name: Leonard [name] Number: [id number]  Date of Service: 03/05/2023  Date of Birth: 01/22/1971      Pre-Operative Diagnosis: Primary osteoarthritis of left hip   Post-Operative Diagnosis: same as above  Procedure(s)/Description:  left total hip arthroplasty  Findings: see operative report     Attending Surgeon: Graciela Husbands, MD  Assistant(s): Minta Balsam, MD; Guerriere, PA-C    Tourniquet Time: Not used  Anesthesia Type: Spinal anesthesia  Estimated Blood Loss:  900 ml  Blood Given: None  Fluids Given: Crystalloid  Complications:  None  Wound Class: Clean Wound: Uninfected operative wounds in which no inflammation occurred    Tubes: None  Drains: None  Specimens/ Cultures: None  Implants: Katrinka Blazing and Nephew           Disposition: PACU - hemodynamically stable.  Condition: stable      Veatrice Kells, PA-C  03/05/2023, 13:08      I was present during the entire procedure and performed the critical portions of the procedure with PA and Resident physician assistance.  Kathee Delton MD  Assistant Professor Orthopaedic Surgery

## 2023-03-05 NOTE — Anesthesia Postprocedure Evaluation (Signed)
Anesthesia Post Op Evaluation    Patient: Leonard [name]  Procedure(s) with comments:  ARTHROPLASTY HIP TOTAL - LAS    Last Vitals:Temperature: 36.5 C (97.7 F) (03/05/23 1604)  Heart Rate: 64 (03/05/23 1604)  BP (Non-Invasive): 94/64 (Per CA statement) (03/05/23 1610)  Respiratory Rate: 16 (03/05/23 1604)  SpO2: 94 % (03/05/23 1604)    No notable events documented.    Patient is sufficiently recovered from the effects of anesthesia to participate in the evaluation and has returned to their pre-procedure level.         Airway patency: patent    Anesthetic complications: no  Cardiovascular status: acceptable  Respiratory status: acceptable

## 2023-03-05 NOTE — H&P (Signed)
[facility]  H&P Update Form    Lance Huaracha    07/31/1971   Encounter Start Date:  03/05/2023   Inpatient Admission Date: 03/05/2023     03/05/2023     STOP: IF H&P IS GREATER THAN 30 DAYS FROM SURGICAL DAY COMPLETE NEW H&P IS REQUIRED.     H & P updated the day of the procedure.  1.  H&P completed within 30 days of surgical procedure and has been reviewed within 24 hours of admission but prior to surgery or a procedure requiring anesthesia services by Dr. Eliott Nine and Dr. Irving Shows  on 02/18/2023 , the patient has been examined, and no change has occured in the patients condition since the H&P was completed.       Change in medications: No        2.  Patient continues to be appropiate candidate for planned surgical procedure. YES    Femoral, peroneal and tibial nerve function is completely intact.    Pedal pulses are palpable and equal to the other extremity.     Does not appreciate a leg length discrepancy       Kathee Delton MD  Associate Professor of Orthopaedic Surgery  Ph   [phone]  Fax [phone]  03/05/2023 08:28

## 2023-03-06 ENCOUNTER — Other Ambulatory Visit: Payer: Self-pay

## 2023-03-06 ENCOUNTER — Observation Stay (HOSPITAL_COMMUNITY): Payer: MEDICAID

## 2023-03-06 DIAGNOSIS — Z9889 Other specified postprocedural states: Secondary | ICD-10-CM

## 2023-03-06 DIAGNOSIS — Z96642 Presence of left artificial hip joint: Principal | ICD-10-CM

## 2023-03-06 DIAGNOSIS — Z0489 Encounter for examination and observation for other specified reasons: Secondary | ICD-10-CM

## 2023-03-06 DIAGNOSIS — M1611 Unilateral primary osteoarthritis, right hip: Secondary | ICD-10-CM

## 2023-03-06 LAB — BASIC METABOLIC PANEL
ANION GAP: 7 mmol/L (ref 4–13)
BUN/CREA RATIO: 18 (ref 6–22)
BUN: 24 mg/dL (ref 8–25)
CALCIUM: 8 mg/dL — ABNORMAL LOW (ref 8.6–10.2)
CHLORIDE: 109 mmol/L (ref 96–111)
CO2 TOTAL: 22 mmol/L (ref 22–30)
CREATININE: 1.31 mg/dL (ref 0.75–1.35)
ESTIMATED GFR - MALE: 65 mL/min/BSA (ref >=60)
GLUCOSE: 101 mg/dL (ref 65–125)
POTASSIUM: 4.6 mmol/L (ref 3.5–5.1)
SODIUM: 138 mmol/L (ref 136–145)

## 2023-03-06 LAB — CBC
HCT: 25.5 % — ABNORMAL LOW (ref 38.9–52.0)
HGB: 8.2 g/dL — ABNORMAL LOW (ref 13.4–17.5)
MCH: 30.5 pg (ref 26.0–32.0)
MCHC: 32.2 g/dL (ref 31.0–35.5)
MCV: 94.8 fL (ref 78.0–100.0)
MPV: 10.4 fL (ref 8.7–12.5)
PLATELETS: 107 10*3/uL — ABNORMAL LOW (ref 150–400)
RBC: 2.69 10*6/uL — ABNORMAL LOW (ref 4.50–6.10)
RDW-CV: 13.2 % (ref 11.5–15.5)
WBC: 8.2 10*3/uL (ref 3.7–11.0)

## 2023-03-06 LAB — POC BLOOD GLUCOSE (RESULTS): GLUCOSE, POC: 104 mg/dl (ref 70–105)

## 2023-03-06 MED ORDER — TESTOSTERONE 20.25 MG/1.25 GRAM PER PUMP ACT.(1.62 %) TRANSDERMAL GEL
20.2500 mg | Freq: Every day | TRANSDERMAL | Status: AC
Start: 2023-03-19 — End: ?

## 2023-03-06 MED ORDER — ETHYL ALCOHOL 62 % (NOZIN NASAL SANITIZER) NASAL SOLUTION - BULK BOTTLE
1.0000 | Freq: Three times a day (TID) | NASAL | Status: AC
Start: 2023-03-06 — End: 2023-03-20

## 2023-03-06 MED ORDER — ACETAMINOPHEN 325 MG TABLET
650.0000 mg | ORAL_TABLET | Freq: Four times a day (QID) | ORAL | Status: DC | PRN
Start: 2023-03-06 — End: 2024-07-12

## 2023-03-06 NOTE — Nurses Notes (Signed)
Patient to be D/C to home. AVS reviewed with patient. Educational handouts were provided regarding Oxycodone, Zofran, and Nozin. Patient educated on activity/weight bearing status, diet, incision care/dressing changes, medications, Nozin administration, importance of regular Bm's, fall prevention, s/s of infection, DVT prevention/detection, follow-up appointments, and when to seek medical attention. Patient given an additional pair of TED hose, dressing supplies, and Nozin supplies. Patient verbalized understanding with no further questions at this time. PIV removed, catheter intact. Family at bedside for transportation. Prescription medications were delivered to bedside by D/C pharmacy. FWW and shower chair delivered to bedside. Patient has all belongings.

## 2023-03-06 NOTE — Discharge Summary (Signed)
[facility]  DEPARTMENT OF ORTHOPAEDICS  DISCHARGE SUMMARY  DATE OF SERVICE: 03/06/2023    [facility]  [address]  [place], [place] [address]  O: [phone]  F: [phone]      PATIENT NAME:  Leonard [name], Leonard [name]  MRN:  [id number]  DOB:  [birth year]    ENCOUNTER DATE:  03/05/2023  INPATIENT ADMISSION DATE:   DISCHARGE DATE:  03/06/2023    ATTENDING PHYSICIAN: Jeanell Sparrow, MD  SERVICE: [place] Livings  PRIMARY CARE PHYSICIAN: Hermenia Fiscal, MD         LAY CAREGIVER:  ,  ,        DISCHARGE MEDICATIONS:     Current Discharge Medication List        START taking these medications.        Details   acetaminophen 325 mg Tablet  Commonly known as: TYLENOL   650 mg, Oral, EVERY 6 HOURS PRN  Refills: 0     alcohol 62% Solution  Commonly known as: NOZIN NASAL SANITIZER   1 Each, Each Nostril, EVERY 8 HOURS (SCHEDULED)  Refills: 0     aspirin 81 mg Tablet, Delayed Release (E.C.)  Commonly known as: ECOTRIN   81 mg, Oral, 2 TIMES DAILY  Qty: 60 Tablet  Refills: 0     ondansetron 4 mg Tablet  Commonly known as: ZOFRAN   4 mg, Oral, EVERY 6 HOURS PRN  Qty: 20 Tablet  Refills: 0     oxyCODONE 5 mg Tablet  Commonly known as: ROXICODONE   5-10 mg, Oral, EVERY 4 HOURS PRN  Qty: 42 Tablet  Refills: 0     sennosides-docusate sodium 8.6-50 mg Tablet  Commonly known as: SENOKOT-S   1 Tablet, Oral, 2 TIMES DAILY  Qty: 60 Tablet  Refills: 0            CONTINUE these medications which have CHANGED during your visit.        Details   testosterone 20.25 mg/1.25 gram (1.62 %) Gel in Metered-dose Pump  Commonly known as: ANDROGEL  Start taking on: Mar 19, 2023  What changed: These instructions start on Mar 19, 2023. If you are unsure what to do until then, ask your doctor or other care provider.   Place 1 pump on the skin Once a day as directed  Refills: 0            CONTINUE these medications - NO CHANGES were made during your visit.        Details   DULoxetine 30 mg Capsule, Delayed  Release(E.C.)  Commonly known as: CYMBALTA DR   30 mg, Oral, DAILY  Refills: 0     Kloxxado 8 mg/actuation Spray, Non-Aerosol  Generic drug: naloxone   8 mg, nasal (alternating), EVERY 2 MIN PRN, for actual or suspected opioid overdose. Alternate nostrils if needing more than one dose. Call 911 if used.  Qty: 4 Each  Refills: 11     metoprolol succinate 25 mg Tablet Sustained Release 24 hr  Commonly known as: TOPROL-XL   25 mg, Oral, DAILY  Qty: 90 Tablet  Refills: 3     PreviDent 5000 Booster Plus 1.1 % Paste  Generic drug: Sodium Fluoride   Instead of toothpaste use, brush on fluoride for 3 minutes and spit out. Do not eat/drink for 30 minutes after use.  Qty: 100 mL  Refills: 5     Sildenafil 25 mg Tablet  Commonly known as: VIAGRA  25 mg, Oral, EVERY 24 HOURS PRN  Qty: 24 Tablet  Refills: 0     traZODone 100 mg Tablet  Commonly known as: DESYREL   100 mg, Oral, NIGHTLY  Qty: 90 Tablet  Refills: 1            STOP taking these medications.      Entresto 49-51 mg Tablet  Generic drug: sacubitriL-valsartan     meloxicam 15 mg Tablet  Commonly known as: Mobic     spironolactone 25 mg Tablet  Commonly known as: ALDACTONE            Discharge med list refreshed?  YES                ALLERGIES:  Allergies   Allergen Reactions    Cat Dander Rash, Itching and Swelling             HOSPITAL PROCEDURE(S):   Bedside Procedures:  No orders of the defined types were placed in this encounter.    Surgical Procedures:  Procedure(s):  ARTHROPLASTY HIP TOTAL      REASON FOR HOSPITALIZATION AND HOSPITAL COURSE     Diagnosis on admission:  DJD left  hip    Other diagnoses:    Patient Active Problem List    Diagnosis    S/P total left hip arthroplasty    Degenerative joint disease (DJD) of hip    Primary osteoarthritis of left hip    Anxiety    Depression    Diabetes mellitus type II, non insulin dependent (CMS HCC)    Severe alcohol use disorder (CMS HCC)       Procedure performed: left total Hip replacement    Significant events  during hospital stay: transient femoral nerve symptoms     Labs during hospital stay:    Hemogram   Lab Results   Component Value Date/Time    WBC 8.2 03/06/2023 05:51 AM    HGB 8.2 (L) 03/06/2023 05:51 AM    HCT 25.5 (L) 03/06/2023 05:51 AM    PLTCNT 107 (L) 03/06/2023 05:51 AM    RBC 2.69 (L) 03/06/2023 05:51 AM    MCV 94.8 03/06/2023 05:51 AM    MCHC 32.2 03/06/2023 05:51 AM    MCH 30.5 03/06/2023 05:51 AM    MPV 10.4 03/06/2023 05:51 AM         Basic Metabolic Profile    Lab Results   Component Value Date/Time    SODIUM 138 03/06/2023 05:51 AM    POTASSIUM 4.6 03/06/2023 05:51 AM    CHLORIDE 109 03/06/2023 05:51 AM    CO2 22 03/06/2023 05:51 AM    ANIONGAP 7 03/06/2023 05:51 AM    Lab Results   Component Value Date/Time    BUN 24 03/06/2023 05:51 AM    CREATININE 1.31 03/06/2023 05:51 AM         Antibiotic therapy:  Routine prophylactic Ancef completed    VTE prophylaxis: TED hose for 4 weeks and twice daily baby aspirin (81 mg) for 4 weeks    Weight bearing on operative extremity:  Weight bearing as tolerated with a walker or two crutches at all times until further notice    Wound care: daily dry dressing change. Signs of infection are to be reported immediately. and any wound drainage past post-op day 6 is to be reported to the surgeon immediately.. The patient should be sent to the [facility] ER if contact with the surgeon or surgical team is unsuccessful  Restrictions:  Posterior hip precautions    Therapy needed:  Total hip replacement protocol    DME needed:  front-wheeled walker and shower chair    Disposition:  home with family    Condition at discharge: Safe but needs assistance    Follow up with the surgeon: in 2 weeks    See discharge instructions below.  These apply whether patient is home or transferred to extended care facility.      CONDITION ON DISCHARGE:  General condition: Pain controlled, tolerating PO intake, passing gas  Cognitive status: A&O  Code status at discharge: FULL  Dressing: Left in  place  Wound: C/d/i, no signs of infection    Additional Consults:  1.  OMOP  - -Hold Entresto, Spironolactone for now. Instructions in AVS. Clinic RN will follow up. His brother is going to buy a BP cuff on his way to pick him up. Hold testosterone 14 days post-op.    Please review attached discharge instructions and call with any questions or concerns.      OPIOID PRESCRIPTION - FIRST PRESCRIPTION  Diagnosis requiring prescription: S/p left total hip arthroplasty    Medication Dosage/Frequency being prescribed: Oxycodone 5mg  (1-2 tablets every 4 hours as needed for pain)    I have reviewed prior medication history in the medical record for this patient.  I have also reviewed information contained in the state controlled prescription drug monitoring database.    I have discussed any history of non-pharmacological treatment with the patient.  The patient reports no prior treatment history.      The patient denies history of substance abuse treatment.      Physical exam findings and/or clinical history warranting use of opioid treatment include: Orthopaedic joint replacement surgery    My goals for treatment include: Pain control and resumption of ADLs    I have discussed the risk of opioid addiction with the patient.  I have also discussed the risk of using sedatives and alcohol while taking opioids.      DISCHARGE DISPOSITION:  Home discharge, Will stay with family , and Home equipment            DISCHARGE INSTRUCTIONS:       TOTAL HIP REPLACEMENT DISCHARGE INSTRUCTIONS    Total Hip Replacement Discharge Instructions    -Weight bearing status on the operative extremity: weight bearing as tolerated    -You Must use two crutches or a walker until further notice.    - You may not switch to the use of a cane.     - No driving until further notice.  The decision to commence driving will be decided at your first postoperative visit.    - Resume a healthy diet when you get home.    - Resume your usual home medications  when you get home unless otherwise indicated at the time of discharge.    - Smoking raises one's risk of post-operative infection, wound healing problems and DVT (blood clots).  If you are a smoker and cannot quit permanently, it is best that you wait at least four weeks from surgery before you resume smoking.        Exercise and restrictions    -Observe hip dislocation precautions as instructed by the therapy staff for the first 6 weeks.  Please refer to the physical therapist's instructions and therapy diagrams.     -joint center posterior hip precautions:         *Do not sit in low chairs or  low commodes.       *Do not rotate your thigh inward or cross your legs.       *Do sleep with a pillow between your legs.    - Physical therapy is rarely necessary after total hip replacement.  The only exercise we recommend is walking and gluteal, quadriceps and calf contractions.  These do not require the supervision by a physical therapist.    - No weightlifting (resistive exercise) with the operative leg for the first 6 weeks.    - No straight leg raising exercise after total hip replacement for 6 weeks as it causes groin pain.        Wound care    - Change the bandage daily with dry gauze.  The incision may be left open to air if there is no drainage.  Someone should inspect the incision daily for you.      - Two days after wound drainage stops, you may shower and wet the incision but you may not swim or soak in a tub.    - Do not apply creams, lotions, salves or medicated ointments to the incision.  Simply apply dry gauze and nothing more.    - I am to be called for ANY DRAINAGE that occurs more than 6 days after surgery    - If you have sutures or staples, only I or one of my staff are to remove them.  They are not to be removed by anyone else unless personally approved by me.        DVT (blood clots)    -Swelling of the operative leg is normal after discharge because you will be more active at home. If however you  develop new pain in the groin, thigh, or calf and if swelling is unrelieved with elevation, this may be a sign of a DVT (blood clot). Please notify your surgeon immediately if this occurs or go the nearest ER to have an ultrasound of your leg.    -Bruising of the operative leg is normal and sometimes the bruising travels all the way to the ankle, foot and toes.  This will gradually go from purple to green to yellow before it disappears completely.    -To prevent DVT (blood clot), the following is advised: twice daily baby aspirin for 4 weeks    -TED hose by providing compression can help prevent DVT and are to be worn on both legs 18 hours per day for 4 weeks.        Post-operative Infection    -Increasing pain, redness, warmth, shaking chills, sweats or fever may be a sign of a post-operative infection.  Please notify me immediately by calling [phone].  If after hours, ask to speak to the orthopaedic resident on call.    -A fever is not uncommon the first three days after surgery.  A fever of 100.5 or higher more than 72 hours after surgery is a concern and you should notify us if this occurs.    - Please inspect the incision every day.  A little spotting on the dressing is normal.  Drainage from the incision that persists more than 6 days after surgery may be a sign of infection and requires my immediate attention.  Please call [phone] and ask to speak to me (or the resident on call after hours or on weekends).    -No one should place you on an antibiotic for signs of a surgical site infection without first consulting this surgeon.  Infection of the hip wound is a surgical emergency and cannot be cured with antibiotics alone.    - No dental procedures for 6 weeks as this can cause infection in your new joint replacement    - You must receive antibiotics immediately prior to dental visits and procedures like colonoscopy and cystoscopy.  This is a life-long recommendation.        Falls    -A fall after  surgery can be devastating.  Falls can result in dislocation, rupture of a ligament or tendon, or fracture of a bone around the new prosthesis. These injuries can be extremely difficult to repair and often require a revision of the entire prosthesis.  Revision operations are associated with a higher complication rate.  Therefore it is crucial that you avoid falling after surgery.  Many falls are caused by poor lighting, a throw rug, a small pet, or too much haste.  Please take time to remove obstacles, secure pets somewhere safe, and improve lighting at home with the use of properly placed nightlights.        Nozin Nasal Swabbing    -Surgical site infections may be related to the number of times we touch our face and nose as the nostrils do harbor bacteria.  Nozin can disinfect the nostrils and reduce the risk of surgical site infection.  Please swab the inside of both nostrils three times per day with a fresh Q-tip as instructed by the nursing staff.  Do this for the first 2 weeks after surgery.        Post-operative pain    -Ice may be applied 20 minutes at a time every 2 hours as needed.  Be careful not to apply ice to bare skin as ice can cause a burn just like heat.    -PRESCRIPTION NARCOTIC PAIN RELIEVERS ARE HIGHLY ADDICTIVE.  THEY SHOULD BE RESERVED FOR MODERATE TO SEVERE PAIN.  THE PRESCRIPTION NARCOTIC WILL NOT BE REFILLED FOR THIS PROBLEM.    -For mild pain, use 2 regular strength Tylenol every 6 hours as needed  -For moderate pain, take 1 prescription pain pill every 4 hours as needed  -For more severe pain, take 2 prescription pain pills every 4 hours as needed    - Prescription pain relievers are constipating for most people.  Be prepared with over the counter remedies like Milk of Magnesia, Prune Juice, Hot Tea, etc.  Senokot has been prescribed to help with constipation.  Fleets enemas and suppositories are available over the counter if needed.    -Unused pain pills may be discarded in the trash  after mixing with kitty litter or coffee grounds.      -If you experience chest pain or shortness of breath, please call 911 immediately.  This may be a sign of a heart attack or pulmonary embolus (blood clot that travels to the heart and lung).    -Follow up with your surgeon 10 to 14 days from discharge.  The appointment date and time will be given to you either before discharge, or you will be contacted via phone/mail with the date and time.      -Please re-read these discharge instructions when you return home as many of your questions may be answered.    [facility]  [phone]     DISCHARGE INSTRUCTION - MISC    It is normal for blood pressure to be lower than usual following surgery.    Please get a blood pressure cuff and check  your blood pressures at home in the morning before you take your medications and again in the afternoon.    For now, continue to take: metoprolol succinate  For now, please do not take: Entresto, Spironolactone     If your blood pressure is consistently higher than 130 systolic (top number) for a few days then you can restart your blood pressure medications one at a time.  If your blood pressure is higher than 160 systolic (top number) resume all your regular home blood pressure medicines.    Please call our nursing staff with any questions or concerns at [phone] ext [phone] or [phone].     DME - WALKER Front Wheeled    Please note - If patient is 300 lbs or greater please order bariatric or heavy duty items.     Patient has mobility limits that significantly impairs ability to participate in one or more mobility related ADL's (MRADL's): Yes    Moblity Limitations: Pt at heightened risk of injury r/t attempts to fulfill MRADL's & can safely use walker which resolves issue    Walker Type: Front Wheeled    Freedom of Choice: I have informed patient of their freedom of choice with respect to DME providers      DME,OTHER (REQUISITION/REQUEST)    Please supply shower  chair     Freedom of Choice: I have informed patient of their freedom of choice with respect to DME providers               --  Pati Gallo, APRN,NP-C  Department of Orthopaedics  Pager: [phone]  Phone: [phone] (9a-5p)   03/06/2023 11:27      Copies sent to Care Team         Relationship Specialty Notifications Start End    Hermenia Fiscal, MD PCP - General FAMILY MEDICINE Admissions 06/18/22     Phone: [phone] Fax: [phone]         [address] [place] [facility] [address]            Referring providers can utilize [email or web address] to access their referred [facility] patient's information.       I personally reviewed the post-hospital care with the patient at the time of discharge.  Very specific instructions were reviewed with the patient in person and issued to the patient in print form.  All questions were answered and instructions regarding follow up were made.  The patient was encouraged to call my office with any questions or concerns.    Kathee Delton MD  Assistant Professor Orthopaedic Surgery  [phone]           cc: Primary CarePhysician:  Hermenia Fiscal, MD  [address]  [facility] [place] [address]     VZ:[id number] Physician:  Provider Unknown  No address on file

## 2023-03-06 NOTE — Care Management Notes (Signed)
[facility]  Care Management Initial Evaluation    Patient Name: Leonard [name]  Date of Birth: 05-10-1971  Sex: male  Date/Time of Admission: 03/05/2023  7:27 AM  Room/Bed: 14/A  Payor: Monia [employer] - [place] / Plan: [employer]TER [employer] - [place] / Product Type: Medicaid [place] /   Primary Care Providers:  Hermenia Fiscal, MD, MD (General)    Pharmacy Info:   Preferred Pharmacy       [facility]    [address] [place] [address]    Phone: [phone] Fax: [phone]    Hours: Monday-Friday 8AM-8PM, Saturday & Sunday 8AM-6PM    [id number] - [place], [place] - [address]    [address] [place] [address]    Phone: [phone] Fax: [phone]    Hours: Not open 24 hours    [facility]    [address] [place] [address]    Phone: [phone] Fax: [phone]    Hours: 24/7          Emergency Contact Info:   Extended Emergency Contact Information  Primary Emergency Contact: BRYAN COOMBS  Mobile Phone: [phone]  Relation: Brother    History:   Eivin Brazeau is a 52 y.o., male, admitted with DJD of hip.    Height/Weight: 182.9 cm (6\' 0.01") / 108 kg (238 lb 8.6 oz)     LOS: 0 days   Admitting Diagnosis: Degenerative joint disease (DJD) of hip [M16.9]    Assessment:      05 /02/24 1018   Assessment Details   Assessment Type Admission   Date of Care Management Update 03/06/23   Date of Next DCP Update 03/07/23   Readmission   Is this a readmission? No   Insurance Information/Type   Insurance type Medicaid   Employment/Financial   Patient has Prescription Coverage?  Yes   Living Environment   Lives With sibling(s)   Living Arrangements house   Able to Return to Prior Arrangements yes   Home Safety   Home Assessment: No Problems Identified   Home Accessibility no concerns;stairs to enter home;bed and bath on same level;stairs within home   Care Management Plan   Discharge Planning Status  initial meeting   Projected Discharge Date 03/06/23   Discharge plan discussed with: Patient   CM will evaluate for rehabilitation potential yes   Patient choice offered to patient/family yes   Form for patient choice reviewed/signed and on chart yes   Facility or Agency Preferences [facility]   Discharge Needs Assessment   Equipment Currently Used at Home none   Equipment Needed After Discharge walker, rolling;shower chair   Discharge Facility/Level of Care Needs Home with DME (code 1)   Transportation Available car;family or friend will provide   Referral Information   Admission Type observation   Address Verified verified-no changes   Arrived From home or self-care   ADVANCE DIRECTIVES   Does the Patient have an Advance Directive? No, Information Offered and Given   LAY CAREGIVER    Appointed Lay Caregiver? I Decline   Home Main Entrance   Number of Stairs, Main Entrance five   Stairs Within Home, Primary   Number of Stairs, Within Home, Primary other (see comments)  (12)         Discharge Plan:  Home with DME (code 1)  IP consult received for discharge needs. Patient resides  with his brother in a 2 story home with bed/bath on 2nd level and a bath on 1st level, 12 steps to 2nd level, 5 entry steps. No DME or Home Health services prior to admission. MSW confirmed address, pharmacy, and PCP information. Patient has prescription coverage and transportation home upon discharge via brother. PT recommending a FWW, OT recommending a Shower chair. Provided patient CarePort listing with CMS ratings for DME serving the [address] zip code. Patient requesting [facility] to have DME delivered to him prior to discharge. Choice form signed and placed on the chart. FWW and Shower chair to be delivered to the patient prior to discharge.     The patient will continue to be evaluated for developing discharge needs.     Case Manager: Heber Carolina, LGSW  Phone: [phone]

## 2023-03-06 NOTE — Progress Notes (Signed)
[facility] Department of Orthopaedics  Daily Progress Note    03/06/2023    Status post Procedure(s) (LRB):  ARTHROPLASTY HIP TOTAL (Left) 1 Day Post-Op    Pain level: mild  S: patient states his leg has felt numb and been weak post operatively and has required help to stand/ambulate.    Exam  Filed Vitals:    03/05/23 2007 03/05/23 2009 03/06/23 0129 03/06/23 0505   BP:  (!) 85/61 (!) 88/57 98/62   Pulse: 73 74 80 87   Resp:   18 16   Temp:   37.2 C (99 F) 37.6 C (99.7 F)   SpO2: 97%  96% 95%     04/30 0700 - 05/01 1859  In: 3290 [P.O.:440; I.V.:2650]  Out: 2200 [Urine:1400]   General: Well appearing, Alert and oriented, and No apparent distress  Breathing: non-labored  Abdomen: soft and non-tender  Surgical Site: dressing clean, dry and intact and no obvious sign of surgical site infection  Neurovascular Exam: Normal pedal pulses, Foot warm and well perfused, and Peroneal and tibial nerve intact    Labs:  CBC  Diff   Lab Results   Component Value Date/Time    WBC 8.2 03/06/2023 05:51 AM    HGB 8.2 (L) 03/06/2023 05:51 AM    HCT 25.5 (L) 03/06/2023 05:51 AM    PLTCNT 107 (L) 03/06/2023 05:51 AM    RBC 2.69 (L) 03/06/2023 05:51 AM    MCV 94.8 03/06/2023 05:51 AM    MCHC 32.2 03/06/2023 05:51 AM    MCH 30.5 03/06/2023 05:51 AM    MPV 10.4 03/06/2023 05:51 AM    Lab Results   Component Value Date/Time    PMNS 54.7 02/18/2023 01:45 PM    MONOCYTES 7.4 02/18/2023 01:45 PM    BASOPHILS 0.7 02/18/2023 01:45 PM    BASOPHILS <0.10 02/18/2023 01:45 PM    PMNABS 4.88 02/18/2023 01:45 PM    LYMPHSABS 2.96 02/18/2023 01:45 PM    EOSABS 0.34 02/18/2023 01:45 PM    MONOSABS 0.66 02/18/2023 01:45 PM              Basic Metabolic Profile    Lab Results   Component Value Date/Time    SODIUM 139 02/18/2023 01:45 PM    POTASSIUM 5.1 02/18/2023 01:45 PM    CHLORIDE 106 02/18/2023 01:45 PM    CO2 26 02/18/2023 01:45 PM    ANIONGAP 7 02/18/2023 01:45 PM    Lab Results   Component Value Date/Time    BUN 36 (H) 02/18/2023 01:45 PM    CREATININE  1.42 (H) 02/18/2023 01:45 PM              Impression:  Active Hospital Problems    Diagnosis    Primary Problem: Degenerative joint disease (DJD) of hip        Plan:  Weight-bearing: Weight bearing as tolerated with a walker or two crutches at all times until further notice  Activity restrictions: Posterior hip precautions  Diet: Regular  Antibiotics (last 24 hours)       Date/Time Action Medication Dose Rate    03/06/23 0510 New Bag/New Syringe    ceFAZolin (ANCEF) 2 g in D5W 50 mL IVPB 2 g 240 mL/hr    03/05/23 2004 New Bag/New Syringe    ceFAZolin (ANCEF) 2 g in D5W 50 mL IVPB 2 g 240 mL/hr    03/05/23 1027 New Bag/New Syringe    ceFAZolin (ANCEF) 2 g in D5W 50 mL IVPB 2  g            Anticoagulation:   Anticoagulants/Antiplatelets (last 24 hours)       None          Bowel function: passing gas  Bladder function: voiding spontaneously  Disposition: home with family  Discharge Readiness: today    Marlou Starks, MD  03/06/2023, 06:22      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  I examined the patient, reviewed vital signs, and reviewed laboratory data.  The above note has been reviewed and I concur with its findings.  Kathee Delton MD  Associate Professor of Orthopaedic Surgery  Ph   [phone]  Fax [phone]  03/07/2023 23:26

## 2023-03-06 NOTE — Care Plan (Signed)
[facility]  Rehabilitation Services  Physical Therapy Progress Note      Patient Name: Leonard [name]  Date of Birth: Apr 28, 1971  Height:  182.9 cm (6' 0.01")  Weight:  108 kg (238 lb 8.6 oz)  Room/Bed: 14/A  Payor: [employer] BETTER [employer] - [place] / Plan: [employer] BETTER HE[employer] - [place] / Product Type: Medicaid [place] /     Assessment:     (P) Patient doing fairly well with moibility. Anticipate discharge to home when medically ready.    Discharge Needs:   Equipment Recommendation: (P) front wheeled walker    The patient presents with mobility limitations due to impaired balance that significantly impair/prevent patient's ability to participate in mobility-related activities of daily living (MRADLs) including  ambulation and transfers in order to safely complete, toileting, bathing, food preparation, laundering/household tasks, safely entering/exiting the home. This functional mobility deficit can be sufficiently resolved with the use of a (P) front wheeled walker in order to decrease the risk of falls, morbidity, and mortality in performance of these MRADLs.  Patient is able to safely use this assistive device.    Discharge Disposition: (P) home with assist    Plan:   Continue to follow patient according to established plan of care.  The risks/benefits of therapy have been discussed with the patient/caregiver and he/she is in agreement with the established plan of care.     Subjective & Objective:        03/06/23 1610   Therapist Pager   PT Assigned/ Pager # Vilma Prader [phone]   Rehab Session   Document Type therapy progress note (daily note)   PT Visit Date 03/06/23   Total PT Minutes: 20   Patient Effort good   Symptoms Noted During/After Treatment none   General Information   Patient Profile Reviewed yes   Onset of Illness/Injury or Date of Surgery 03/05/23   Patient/Family/Caregiver Comments/Observations cooperative   Pertinent History of Current Functional Problem s/p Left THA   Medical Lines PIV Line   Existing  Precautions/Restrictions fall precautions;posterior hip precautions;weight bearing restriction   Weight-bearing Status   Left Lower Extremity weight-bearing as tolerated (WBAT)   Mutuality/Individual Preferences   Individualized Care Needs ambulate using FWW with supervision   Pre Treatment Status   Pre Treatment Patient Status Patient sitting in bedside chair or w/c   Support Present Pre Treatment  None   Communication Pre Treatment  Nurse   Cognitive Assessment/Interventions   Behavior/Mood Observations cooperative   Orientation Status oriented x 4   Attention WNL/WFL   Follows Commands WNL   Pain Assessment   Pre/Posttreatment Pain Comment complaints of increased pain but didn't rate, has been medicated   Mobility Assessment/Training   Additional Documentation Stairs Assessment/Treatment (Group)   Transfer Assessment/Treatment   Sit-Stand Independence supervision required   Stand-Sit Independence supervision required   Sit-Stand-Sit, Assist Device walker, front wheeled   Transfer Safety Issues balance decreased during turns;step length decreased;weight-shifting ability decreased   Transfer Impairments balance impaired;endurance;pain;ROM decreased;strength decreased   Gait Assessment/Treatment   Total Distance Ambulated 200   Independence  supervision required   Assistive Device  walker, front wheeled   Distance in Feet 200   Deviations  cadence decreased;step length decreased;weight-shifting ability decreased   Maintain Weight Bearing Status able to maintain   Safety Issues  balance decreased during turns;step length decreased;weight-shifting ability decreased   Impairments  balance impaired;endurance;pain;ROM decreased;strength decreased   Stairs Assessment/Treatment   Number of Stairs 7   Impairments balance impaired;endurance;pain;ROM  decreased;strength decreased   Assistive Device straight cane   Handrail Location right side (ascending)   Independence Level supervision required   Safety Issues balance  decreased during turns;weight-shifting ability decreased   Technique Used step to step (ascending);step to step (descending)   Maintain Weight Bearing Status able to maintain weight bearing status   Balance Skill Training   Comment FWW in standing   Sitting Balance: Static good balance   Sitting, Dynamic (Balance) good balance   Sit-to-Stand Balance fair - balance   Standing Balance: Static fair balance   Standing Balance: Dynamic fair - balance   Systems Impairment Contributing to Balance Disturbance musculoskeletal   Identified Impairments Contributing to Balance Disturbance decreased ROM;decreased strength;pain   Post Treatment Status   Post Treatment Patient Status Patient sitting in bedside chair or w/c   Support Present Post Treatment  None   Communication Post Treatement Nurse   Plan of Care Review   Plan Of Care Reviewed With patient   Basic Mobility Am-PAC/6Clicks Score (APPROVED Staff)   Turning in bed without bedrails 4   Lying on back to sitting on edge of flat bed 3   Moving to and from a bed to a chair 3   Standing up from chair 4   Walk in room 4   Climbing 3-5 steps with railing 3   6 Clicks Raw Score total 21   Standardized (t-scale) score 45.55   Patient Mobility Goal (JHHLM) 7- Walk 25 feet or more 3X/day   Exercise/Activity Level Performed 7- Walked 25 feet or more   Physical Therapy Clinical Impression   Assessment Patient doing fairly well with moibility. Anticipate discharge to home when medically ready.   Patient/Family Goals Statement none stated   Criteria for Skilled Therapeutic meets criteria;skilled treatment is necessary   Pathology/Pathophysiology Noted musculoskeletal   Impairments Found (describe specific impairments) aerobic capacity/endurance;gait, locomotion, and balance   Functional Limitations in Following  self-care;home management   Disability: Inability to Perform community/leisure   Rehab Potential good   Therapy Frequency minimum of 3x/week   Predicted Duration of Therapy  Intervention (days/wks) until discharge   Anticipated Equipment Needs at Discharge (PT) front wheeled walker   Anticipated Discharge Disposition home with assist       Therapist:   Vassie Moselle, PT   Pager #: [phone]

## 2023-03-06 NOTE — Care Plan (Signed)
[facility]  Rehabilitation Services  Occupational Therapy Initial Evaluation    Patient Name: Leonard [name]  Date of Birth: 1971/04/09  Height: Height: 182.9 cm (6' 0.01")  Weight: Weight: 108 kg (238 lb 8.6 oz)  Room/Bed: 14/A  Payor: [employer] BETTER HE[employer] - [place] / Plan: [employer] BETTER HEALTH - [place] / Product Type: Medicaid [place] /     Assessment:   Leonard [name] tolerated this OT evaluation well. Educated on posterior hip precautions, provided handout and AE. Min A needed for LB dressing with AE, cues for sequencing. Ambulating and transferring with SBA, demo'd tub tx with SBA. Recommend return home with assist once medically stable for d/c, needs FWW and shower chair      Discharge Needs:   Equipment Recommendation: shower chair, front wheeled walker    The patient presents with mobility limitations due to impaired balance, impaired strength, weight bearing restrictions, and impaired functional activity tolerance that significantly impair/prevent patient's ability to participate in mobility-related activities of daily living (MRADLs) including  ambulation and transfers in order to safely complete, bathing, safely entering/exiting the home, in reasonable time. This functional mobility deficit can be sufficiently resolved with the use of a shower chair, front wheeled walker in order to decrease the risk of falls, morbidity, and mortality in performance of these MRADLs.  Patient is able to safely use this assistive device.    Discharge Disposition: home with assist    JUSTIFICATION OF DISCHARGE RECOMMENDATION   Based on current diagnosis, functional performance prior to admission, and current functional performance, this patient requires continued OT services in home with assist  in order to achieve significant functional improvements.    Plan:   Current Intervention: ADL retraining, balance training, bed mobility training, transfer training    To provide Occupational therapy services 1x/day, minimum of 2x/week, until  discharge.       The risks/benefits of therapy have been discussed with the patient/caregiver and he/she is in agreement with the established plan of care.       Subjective & Objective        03/06/23 0840   Therapist Pager   OT Assigned/ Pager # Ovida Delagarza [phone]   Rehab Session   Document Type evaluation   OT Visit Date 03/06/23   Total OT Minutes: 20   Patient Effort good   Symptoms Noted During/After Treatment none   General Information   Patient Profile Reviewed yes   Onset of Illness/Injury or Date of Surgery 03/05/23   Pertinent History of Current Functional Problem s/p L THA   Medical Lines PIV Line   Respiratory Status room air   Existing Precautions/Restrictions full code;fall precautions;posterior hip precautions;weight bearing restriction  (WBAT LLE)   Pre Treatment Status   Pre Treatment Patient Status Patient sitting in bedside chair or w/c;Call light within reach;Telephone within reach;Patient safety alarm activated;Nurse approved session   Support Present Pre Treatment  None   Communication Pre Treatment  Nurse   Mutuality/Individual Preferences   Individualized Care Needs OOB with FWW and A x1   Living Environment   Lives With sibling(s)   Living Arrangements house   Home Accessibility stairs to enter home;tub/shower is not walk in;stairs within home   Home Main Entrance   Number of Stairs, Main Entrance five   Stairs Within Home, Primary   Stairs, Within Home, Primary 12   Functional Level Prior   Ambulation 0 - independent   Transferring 0 - independent   Toileting 0 - independent  Bathing 0 - independent   Dressing 0 - independent   Eating 0 - independent   Prior Functional Level Comment IND PTA   Self-Care   Equipment Currently Used at Home none   Vital Signs   Vitals Comment no s/s of distress   Pain Assessment   Pre/Posttreatment Pain Comment did not formally rate, reports increased with activity   Coping/Psychosocial   Observed Emotional State calm;cooperative   Verbalized Emotional State  acceptance   Coping/Psychosocial Response Interventions   Plan Of Care Reviewed With patient   Cognitive Assessment/Interventions   Behavior/Mood Observations alert;cooperative   Orientation Status oriented x 4   Attention WNL/WFL   Follows Commands WNL   RUE Assessment   RUE Assessment WFL- Within Functional Limits   LUE Assessment   LUE Assessment WFL- Within Functional Limits   Mobility Assessment/Training   Mobility Comment pt ambulated household distance using FWW with SBA, cues for sequencing   Weight-bearing Status   Extremity Weight-bearing Status left lower extremity   Left Lower Extremity weight-bearing as tolerated (WBAT)   Transfer Assessment/Treatment   Sit-Stand Independence stand-by assistance   Stand-Sit Independence stand-by assistance   Sit-Stand-Sit, Assist Device walker, front wheeled   Bathtub Transfer Independence stand-by assistance;nonverbal cues required (demo/gesture);verbal cues required   Bathtub Assist Device walker, front wheeled  (shower chair)   Transfer Safety Issues balance decreased during turns   Transfer Impairments balance impaired;coordination impaired;strength decreased;pain   Upper Body Dressing Assessment/Training   Position  standing   DRESSING ASSESSED Don Shirt-pull over   Independence Level  independent   Lower Body Dressing Assessment/Training   Assistive Devices reacher   Position sitting;standing   DRESSING ASSESSED Don Pants- pull up;Don Underwear   Independence Level  minimum assist (75% patient effort)   Impairments coordination impaired;flexibility decreased;pain;ROM decreased   Balance Skill Training   Comment FWW   Sitting Balance: Static good balance   Sitting, Dynamic (Balance) good balance   Sit-to-Stand Balance fair balance   Standing Balance: Static fair balance   Standing Balance: Dynamic fair - balance   Systems Impairment Contributing to Balance Disturbance musculoskeletal   Identified Impairments Contributing to Balance Disturbance decreased  ROM;pain;impaired coordination;decreased strength   Post Treatment Status   Post Treatment Patient Status Other (See comments)  (with PT)   Support Present Post Treatment  Other (See comments)  (with PT)   Care Plan Goals   OT Rehab Goals Bed Mobility Goal;Transfer Training Goal 2;LB Dressing Goal;Toileting Goal   Bed Mobility Goal   Bed Mobility Goal, Date Established 03/06/23   Bed Mobility Goal, Time to Achieve by discharge   Bed Mobility Goal, Activity Type all bed mobility activities   Bed Mobility Goal, Independence Level modified independence   Bed Mobility Goal, Assistive Device leg lifter   LB Dressing Goal   LB Dressing Goal, Date Established 03/06/23   LB Dressing Goal, Time to Achieve by discharge   LB Dressing Goal, Activity Type all lower body dressing tasks   LB Dressing Goal, Independence Level modified independence   LB Dressing Goal, Adaptive Equipment sock-aid;reacher   Toileting Goal   Toileting Goal, Date Established 03/06/23   Toileting Goal, Time to Achieve by discharge   Toileting Goal, Activity Type all toileting tasks   Toileting Goal, Independence Level independent   Transfer Training Goal 2   Transfer Training Goal, Date Established 03/06/23   Transfer Training Goal, Time to Achieve by discharge   Transfer Training Goal, Activity Type all transfers  Transfer Training Goal, Independence Level modified independence   Transfer Training Goal, Assist Device   (FWW)   Planned Therapy Interventions, OT Eval   Planned Therapy Interventions ADL retraining;balance training;bed mobility training;transfer training   Functional Impairment   Overall Functional Impairments/Problem List balance impaired;coordination impaired;pain;flexibility decreased;ROM decreased;strength decreased   Clinical Impression   Functional Level at Time of Session Leonard Rudesill tolerated this OT evaluation well. Educated on posterior hip precautions, provided handout and AE. Min A needed for LB dressing with AE, cues for  sequencing. Ambulating and transferring with SBA, demo'd tub tx with SBA. Recommend return home with assist once medically stable for d/c, needs FWW and shower chair   Criteria for Skilled Therapeutic Interventions Met (OT) yes;skilled treatment is necessary;meets criteria   Rehab Potential good   Therapy Frequency 1x/day;minimum of 2x/week   Predicted Duration of Therapy until discharge   Anticipated Equipment Needs at Discharge shower chair;front wheeled walker   Anticipated Discharge Disposition home with assist   Highest level of Mobility score   Exercise/Activity Level Performed 7- Walked 25 feet or more   Evaluation Complexity Justification   Occupational Profile Review Expanded review   Performance Deficits 3-5 deficits;Pain;Mobility;Balance;Coordination;Range of motion;Strength   Clinical Decision Making Moderate analytic complexity   Evaluation Complexity Moderate       Therapist:   Vira Agar, OT   Pager #: [phone]

## 2023-03-06 NOTE — Consults (Signed)
Orthopaedic Medical Optimization Program Consult Note  Requesting provider: Jeanell Sparrow, MD  Leonard [name] [id number]  03/06/2023  09:28        Reason for Consult: Procedure(s) (LRB):  ARTHROPLASTY HIP TOTAL (Left)  Assessment/Plan:   Active Hospital Problems   (*Primary Problem)    Diagnosis    *Degenerative joint disease (DJD) of hip     Left hip DJD  -S/p L THA 03/05/23     CKD3a  -baseline Cr around 1.4. Avoid NSAIDs     Low testosterone  -follows with Urology  -hold testosterone 14 days post-op     EtOH abuse disorder  -[facility]/16/22-06/13/22  -EtOH level negative 10/22/22. Denies use.  -Follows with Psych therapist  -No evidence of withdrawal     History of polysubstance abuse  -Xanax, Ritalin, previously cocaine, psilocybin, cannabis  -urine drug screen negative 10/22/22. Denies use.     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 Reports EF previously 30% when living in [place] (likely from cocaine and EtOH)  -Continue metoprolol succinate  -Hold Entresto, Spironolactone for now  -Avoid maintenance IVF    Hypertension  -Continue metoprolol succinate  -Hold Entresto, Spironolactone for now. Instructions in AVS. Clinic RN will follow up. His brother is going to buy a BP cuff on his way to pick him up     Severe OSA  -Sleep study 10/24/22 with [facility]  -Follows with Sleep Medicine    HPI: Leonard [name] is 52 y.o. male who underwent ARTHROPLASTY HIP TOTAL: 96295 (CPT) and is 1 Day Post-Op . The patient is seen post-operatively, denies pain, and has been able to ambulate. He has had something to eat without nausea or vomiting. He has voided. Denies CP, SOB, dizziness, bleeding. He is feeling ready to go home.    Past Medical History:  He has a past medical history of Anxiety, Depression, Diabetes mellitus type II, non insulin dependent (CMS HCC), Diverticulosis of colon, GERD (gastroesophageal reflux  disease), Heart failure (CMS HCC), Hip pain, HTN (hypertension), Low testosterone, Osteoarthritis, Ritalin use disorder, severe (CMS HCC), Severe alcohol use disorder (CMS HCC), Severe benzodiazepine use disorder (CMS HCC), Sleep apnea, Tobacco abuse, and Type 2 diabetes mellitus (CMS HCC).    He has a past surgical history that includes Inguinal hernia repair (1990) and Colectomy partial / total.    He reports that he quit smoking about 6 weeks ago. His smoking use included cigarettes. He has never used smokeless tobacco.  He reports that he does not currently use alcohol.  He reports that he does not currently use drugs after having used the following drugs: Cocaine. Frequency: 3.00 times per week.      His family history includes Alzheimer's/Dementia in his mother; Breast Cancer in his maternal grandmother; Cancer in his maternal grandfather, maternal grandmother, and paternal grandfather; Congestive Heart Failure in his father; No Known Problems in his brother.  Physical Exam  Temp  Avg: 36.8 C (98.3 F)  Min: 36.4 C (97.5 F)  Max: 37.6 C (99.7 F)  Pulse  Avg: 75.5  Min: 64  Max: 87 BP  Min: 78/56  Max: 104/65  Resp  Avg: 17  Min: 13  Max: 21 SpO2  Avg: 95.7 %  Min: 92 %  Max: 99 %  Vitals reviewed.   Constitutional:       Appearance: He is well-developed and in no acute  distress  HENT:      Head: Normocephalic.      Mouth/Throat: mucosa moist  Eyes:      General: No scleral icterus.     Conjunctiva/sclera: Conjunctivae normal.   Cardiovascular:      Rate and Rhythm: Normal rate and regular rhythm.      Heart sounds: Normal heart sounds. No murmur heard.    No friction rub. No gallop.   Pulmonary:      Effort: Pulmonary effort is normal. No respiratory distress.      Breath sounds: Normal breath sounds. No wheezing or rales.   Abdominal:      General: Bowel sounds are normal. There is no distension.      Palpations: Abdomen is soft.      Tenderness: There is no abdominal tenderness.   Musculoskeletal:       Cervical back: Neck supple.      Comments: Dressing clean  Skin:     General: Skin is warm and dry.      Findings: No erythema or rash.   Neurological:      Mental Status: He is alert and oriented to person, place, and time.      Cranial Nerves: No cranial nerve deficit. At baseline  Psychiatric:         Behavior: Behavior normal.   I have reviewed the following diagnostics:  BMP:   138 (05/02 0551) 109 (05/02 0551) 24 (05/02 0551)    /         4.6 (05/02 0551) 22 (05/02 0551) 1.31 (05/02 0551) \            CBC:     8.2 (05/02 0551) \   8.2* (05/02 0551) /   107* (05/02 0551)      / 25.5* (05/02 0551) \         Recent Labs     03/05/23  0820 03/05/23  1314 03/05/23  1636 03/05/23  1909 03/05/23  2137 03/06/23  0646   GLUIP 96 131* 96 120* 119* 104      Antihyperglycemics (last 24 hours)       None           No results found for any visits on 03/05/23 (from the past 24 hour(s)).   VTE Prophylaxis   Anticoagulants/Antiplatelets (last 24 hours)       Date/Time Action Medication Dose    03/06/23 0830 Given    aspirin (ECOTRIN) enteric coated tablet 81 mg 81 mg          MDM:  All questions were answered to patient's satisfaction.  I have evaluated, performed and/or ordered the following for MDM: Labs, Medication management, and discussion with primary team.      Abbie Sons, MD, MPH  Orthopaedic Medical Optimization Program  Department of Orthopaedics  [facility]  [place]

## 2023-03-07 ENCOUNTER — Encounter (HOSPITAL_BASED_OUTPATIENT_CLINIC_OR_DEPARTMENT_OTHER): Payer: Self-pay | Admitting: Orthopaedic Surgery

## 2023-03-10 ENCOUNTER — Telehealth (HOSPITAL_BASED_OUTPATIENT_CLINIC_OR_DEPARTMENT_OTHER): Payer: Self-pay

## 2023-03-10 ENCOUNTER — Telehealth (HOSPITAL_BASED_OUTPATIENT_CLINIC_OR_DEPARTMENT_OTHER): Payer: Self-pay | Admitting: Orthopaedic Surgery

## 2023-03-10 NOTE — Telephone Encounter (Signed)
Called patient to check on BP since discharge. Patient states BP has been 90/60. Patient has restarted the Aldactone and Entresto. Patient is reporting knee pain of 8/10 at this time. Patient is wondering if knee pain is normal with the hip replacement. Message forwarded to joint replacement nurse.     Ursula Beath, RN

## 2023-03-10 NOTE — Telephone Encounter (Signed)
Received message from [name] that patient was having pain.  Called patient to see how he was doing. He is bearing weight and  He states he is having pain but nothing like pre op.  He is taking one oxy every 6 hours and is NOT taking any tylenol.  His Incision is healing well with no redness edema or drainage  He is wearing his TED hose taking his ASA and icing and elevating.  Advised to start tylenol and to take the oxy every 4 hours till his pain is better controlled.  Advised him to call me with any changes or concerns

## 2023-03-11 ENCOUNTER — Encounter (HOSPITAL_BASED_OUTPATIENT_CLINIC_OR_DEPARTMENT_OTHER): Payer: Self-pay | Admitting: Orthopaedic Surgery

## 2023-03-14 ENCOUNTER — Telehealth (HOSPITAL_BASED_OUTPATIENT_CLINIC_OR_DEPARTMENT_OTHER): Payer: Self-pay

## 2023-03-14 NOTE — Telephone Encounter (Signed)
Called patient to check on BP. No answer. Left message with callback number and ext.     Gracin Soohoo, RN

## 2023-03-18 ENCOUNTER — Encounter (HOSPITAL_BASED_OUTPATIENT_CLINIC_OR_DEPARTMENT_OTHER): Payer: Self-pay | Admitting: Orthopaedic Surgery

## 2023-03-18 ENCOUNTER — Ambulatory Visit: Payer: MEDICAID | Attending: Orthopaedic Surgery | Admitting: Orthopaedic Surgery

## 2023-03-18 ENCOUNTER — Other Ambulatory Visit: Payer: Self-pay

## 2023-03-18 VITALS — Temp 96.4°F | Ht 71.26 in | Wt 224.6 lb

## 2023-03-18 DIAGNOSIS — M1611 Unilateral primary osteoarthritis, right hip: Secondary | ICD-10-CM | POA: Insufficient documentation

## 2023-03-18 DIAGNOSIS — Z96642 Presence of left artificial hip joint: Secondary | ICD-10-CM | POA: Insufficient documentation

## 2023-03-18 DIAGNOSIS — Z471 Aftercare following joint replacement surgery: Secondary | ICD-10-CM

## 2023-03-18 NOTE — Patient Instructions (Addendum)
[facility]placement    Please call or MyChart message [place] with worsening, pain, redness, warmth, swelling, fever, chills, sweats, or ANY drainage.    Two more weeks of Aspirin and TED hose    Please use a cane from this point forward    Please do not fall or twist the wrong way    Likely return to work after next visit     Jeanell Sparrow, MD  03/18/2023, 11:34

## 2023-03-18 NOTE — Nursing Note (Signed)
Paperwork for one source completed and faxed to [phone] with a successful transmission and copy scanned to chart

## 2023-03-18 NOTE — Progress Notes (Signed)
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  The patient reports 5/10 pain.  Patient denies fever, chills or sweats.  Patient denies chest pain or shortness of breath.  The patient is using twice daily baby aspirin for DVT prophylaxis. Comes in today without a walker or cane.     EXAM:    Temp (!) 35.8 C (96.4 F)   Ht 1.81 m (5' 11.26")   Wt 102 kg (224 lb 10.4 oz)   BMI 31.10 kg/m   On exam, there is no obvious sign of infection.  There is no drainage.  The incision appears to be healing nicely.  There is no obvious clinical sign DVT.  Leg lengths appear to be equal.  Peroneal and tibial nerve it intact. Scabs at several locations presumably due to tape blisters    Post op x-rays in the hospital show a well-aligned well-fixed L total hip replacement  Moderate R hip DJD as evidenced by subchondral sclerosis and aspherical head        IMPRESSION:     ICD-10-CM    1. Status post hip replacement, left  Z96.642       2. Primary osteoarthritis of right hip  M16.11            PLAN:  Signs and symptoms of peri-prosthetic infection and thromboemobolism were re-iterated. Hip precautions were reinforced. The patient was encouraged to increase activity as tolerated.  Low impact activity was advised.  Return to work status:  not until further notice.  The incision had been closed with a running subcuticular stitch and skin glue.  The patient may shower and cleanse the incision but may not swim or soak in a tub until the sixth week after surgery. The patient will follow up in 4 weeks with a bilateral hip series.  The patient was encouraged to contact us with questions or concerns. That he came in without a walker or cane is concerning to me. In addition, I am concerned for the wounds associated with tape. Reminded him that infection is a big deal and that a sudden stumble or fall could have catastrophic consequences.      Jeanell Sparrow, MD  03/18/2023, 11:29      Cc:    Hermenia Fiscal, MD  [address]  [facility] [address]    Self, Referral  No address on file

## 2023-03-20 ENCOUNTER — Other Ambulatory Visit: Payer: Self-pay

## 2023-03-20 ENCOUNTER — Other Ambulatory Visit (HOSPITAL_BASED_OUTPATIENT_CLINIC_OR_DEPARTMENT_OTHER): Payer: Self-pay

## 2023-03-20 ENCOUNTER — Other Ambulatory Visit (HOSPITAL_BASED_OUTPATIENT_CLINIC_OR_DEPARTMENT_OTHER): Payer: Self-pay | Admitting: ORTHOPEDIC, SPORTS MEDICINE

## 2023-03-21 ENCOUNTER — Other Ambulatory Visit: Payer: Self-pay

## 2023-03-21 MED ORDER — MELOXICAM 15 MG TABLET
15.0000 mg | ORAL_TABLET | Freq: Every day | ORAL | 5 refills | Status: DC
Start: 2023-03-21 — End: 2023-09-21
  Filled 2023-03-21: qty 30, 30d supply, fill #0
  Filled 2023-04-15: qty 30, 30d supply, fill #1
  Filled 2023-05-15: qty 30, 30d supply, fill #2
  Filled 2023-06-17: qty 30, 30d supply, fill #3
  Filled 2023-07-21: qty 30, 30d supply, fill #4
  Filled 2023-08-15: qty 30, 30d supply, fill #5
  Filled 2023-09-23: qty 3, 3d supply, fill #6

## 2023-03-28 ENCOUNTER — Encounter (HOSPITAL_BASED_OUTPATIENT_CLINIC_OR_DEPARTMENT_OTHER): Payer: Self-pay

## 2023-03-28 NOTE — Progress Notes (Signed)
Left message for patient to call the sleep center for results. Best, Folashade Gamboa RPSGT

## 2023-04-07 DIAGNOSIS — F32A Depression, unspecified: Secondary | ICD-10-CM

## 2023-04-08 ENCOUNTER — Other Ambulatory Visit: Payer: Self-pay

## 2023-04-15 ENCOUNTER — Ambulatory Visit: Payer: MEDICAID | Attending: Orthopaedic Surgery | Admitting: Orthopaedic Surgery

## 2023-04-15 ENCOUNTER — Ambulatory Visit (HOSPITAL_BASED_OUTPATIENT_CLINIC_OR_DEPARTMENT_OTHER): Payer: MEDICAID | Admitting: Student in an Organized Health Care Education/Training Program

## 2023-04-15 ENCOUNTER — Other Ambulatory Visit: Payer: Self-pay

## 2023-04-15 ENCOUNTER — Inpatient Hospital Stay (HOSPITAL_BASED_OUTPATIENT_CLINIC_OR_DEPARTMENT_OTHER)
Admission: RE | Admit: 2023-04-15 | Discharge: 2023-04-15 | Disposition: A | Discharge status: Home / Self Care | Payer: MEDICAID | Source: Ambulatory Visit | Admitting: Radiology

## 2023-04-15 VITALS — Temp 96.8°F | Ht 70.47 in | Wt 227.1 lb

## 2023-04-15 DIAGNOSIS — Z96642 Presence of left artificial hip joint: Secondary | ICD-10-CM

## 2023-04-15 DIAGNOSIS — M1612 Unilateral primary osteoarthritis, left hip: Secondary | ICD-10-CM

## 2023-04-15 DIAGNOSIS — Z471 Aftercare following joint replacement surgery: Secondary | ICD-10-CM

## 2023-04-15 NOTE — Progress Notes (Signed)
[facility] for [facility] Replacement    SUBJECTIVE:  The patient returns today 6 weeks status post left total Hip replacement.  The patient reports 2/10 pain.  Patient denies fever, chills or sweats.  Patient denies chest pain or shortness of breath.  The patient has completed aspirin for DVT prophylaxis.    EXAM:    Temp 36 C (96.8 F)   Ht 1.79 m (5' 10.47")   Wt 103 kg (227 lb 1.2 oz)   BMI 32.15 kg/m     On exam, there is no obvious sign of infection.  There is no drainage.  The incision appears to be healed.  There is no obvious sign DVT.  Leg lengths appear to be equal.  Peroneal and tibial nerve it intact.  Gait appears to be near normal.    X-RAYS:  Radiographs of the left hip performed today reveal a well aligned well fixed left total hip replacement.      IMPRESSION:     ICD-10-CM    1. Status post hip replacement, left  N02.725            PLAN:  Signs and symptoms of peri-prosthetic infection and thromboemobolism were re-iterated. Hip precautions were relaxed. The patient was encouraged to increase activity as tolerated.  Low impact activity was advised.  The patient may resume bathing in a tub or pool.  Return to work status:  the patient has already returned to work.  The patient will follow up in 6 weeks with a left hip series.  The patient was encouraged to contact us with questions or concerns.    This patient was seen independently in clinic.  Veatrice Kells, PA-C  04/15/2023, 13:46      The patient's condition and plan of care was discussed with me.  The note above was personally reviewed and appended before signature.    Kathee Delton MD  Associate Professor of Orthopaedic Surgery  Ph   [phone]  Fax [phone]  Pager [phone]  04/16/2023 07:06        Cc:    Hermenia Fiscal, MD  [address] [place]  [facility] [address]    Hilda Lias, MD  [address]  [place],  [place] [address]

## 2023-04-21 ENCOUNTER — Other Ambulatory Visit: Payer: Self-pay

## 2023-04-23 ENCOUNTER — Ambulatory Visit (INDEPENDENT_AMBULATORY_CARE_PROVIDER_SITE_OTHER): Payer: Self-pay | Admitting: Urology

## 2023-04-23 NOTE — Telephone Encounter (Signed)
RN received from [employer] that prior Berkley Harvey is needed for androgel. RN spoke with [facility]. RN was notified [facility] needs updated labs . RN faxed to number requested. RN scanned into media. Will await response.     Kevon Tench, CLINICAL CARE COORDINATOR

## 2023-04-25 ENCOUNTER — Other Ambulatory Visit: Payer: Self-pay

## 2023-05-01 ENCOUNTER — Other Ambulatory Visit: Payer: Self-pay

## 2023-05-05 NOTE — Care Management Notes (Signed)
Referral Information  ++++++ Placed Provider #1 ++++++  Case Manager: Daniel Johnston  Provider Type: DME  Provider Name: [facility]  Address:  [address]  [place], [place] [address]  Contact:    Fax:   Fax:

## 2023-05-06 ENCOUNTER — Other Ambulatory Visit (HOSPITAL_BASED_OUTPATIENT_CLINIC_OR_DEPARTMENT_OTHER): Payer: Self-pay | Admitting: Internal Medicine

## 2023-05-06 DIAGNOSIS — G4733 Obstructive sleep apnea (adult) (pediatric): Secondary | ICD-10-CM

## 2023-05-09 ENCOUNTER — Ambulatory Visit (HOSPITAL_BASED_OUTPATIENT_CLINIC_OR_DEPARTMENT_OTHER): Payer: Self-pay | Admitting: Surgical

## 2023-05-15 ENCOUNTER — Other Ambulatory Visit: Payer: Self-pay

## 2023-05-16 ENCOUNTER — Other Ambulatory Visit: Payer: Self-pay

## 2023-05-21 ENCOUNTER — Encounter (INDEPENDENT_AMBULATORY_CARE_PROVIDER_SITE_OTHER): Payer: Self-pay

## 2023-05-21 ENCOUNTER — Other Ambulatory Visit: Payer: Self-pay

## 2023-05-22 ENCOUNTER — Other Ambulatory Visit: Payer: Self-pay

## 2023-05-22 ENCOUNTER — Inpatient Hospital Stay (HOSPITAL_BASED_OUTPATIENT_CLINIC_OR_DEPARTMENT_OTHER)
Admission: RE | Admit: 2023-05-22 | Discharge: 2023-05-22 | Disposition: A | Discharge status: Home / Self Care | Payer: MEDICAID | Source: Ambulatory Visit | Admitting: Radiology

## 2023-05-22 ENCOUNTER — Ambulatory Visit: Payer: MEDICAID | Attending: Surgical | Admitting: Surgical

## 2023-05-22 VITALS — Temp 96.8°F | Ht 70.67 in | Wt 228.8 lb

## 2023-05-22 DIAGNOSIS — M170 Bilateral primary osteoarthritis of knee: Secondary | ICD-10-CM | POA: Insufficient documentation

## 2023-05-22 DIAGNOSIS — M25559 Pain in unspecified hip: Secondary | ICD-10-CM

## 2023-05-22 DIAGNOSIS — Z96642 Presence of left artificial hip joint: Secondary | ICD-10-CM

## 2023-05-22 DIAGNOSIS — M25552 Pain in left hip: Secondary | ICD-10-CM

## 2023-05-22 NOTE — Procedures (Signed)
JOINT REPLACEMENT, [facility] [address]  [place] [address]  Operated by [facility]  Procedure Note    Name: Leonard [name] MRN:  [id number]   Date: 05/22/2023 DOB:  03-31-1971 (51 y.o.)         Joint Asp/Inj    Performed by: Milton Ferguson, PA-C  Authorized by: Milton Ferguson, PA-C    Consent:     Consent obtained:  Verbal    Consent given by:  Patient    Risks discussed:  Bleeding, infection and pain    Alternatives discussed:  No treatment, alternative treatment and observation  Location:     Location:  Knee    Knee joint: bilateral.  Anesthesia (see MAR for exact dosages):     Anesthesia method:  Topical application  Procedure details:     Needle gauge:  22 G    Ultrasound guidance: no      Approach:  Medial    Steroid injected: yes      Specimen collected: no    Comments:      Injections were performed on bilateral knees using 4 ml of 1% plain Lidocaine and 40 mg/ml of Depo Medrol.    This patient was seen independently in clinic.  Milton Ferguson, PA-C 05/22/2023, 11:33        Milton Ferguson, PA-C

## 2023-05-26 ENCOUNTER — Other Ambulatory Visit (INDEPENDENT_AMBULATORY_CARE_PROVIDER_SITE_OTHER): Payer: Self-pay | Admitting: Urology

## 2023-05-26 ENCOUNTER — Ambulatory Visit (INDEPENDENT_AMBULATORY_CARE_PROVIDER_SITE_OTHER): Payer: Self-pay | Admitting: Urology

## 2023-05-26 ENCOUNTER — Other Ambulatory Visit (HOSPITAL_COMMUNITY): Payer: Self-pay | Admitting: Hospitalist

## 2023-05-26 ENCOUNTER — Other Ambulatory Visit: Payer: Self-pay

## 2023-05-26 ENCOUNTER — Other Ambulatory Visit (HOSPITAL_BASED_OUTPATIENT_CLINIC_OR_DEPARTMENT_OTHER): Payer: Self-pay

## 2023-05-26 MED ORDER — TESTOSTERONE 20.25 MG/1.25 GRAM PER PUMP ACT.(1.62 %) TRANSDERMAL GEL
20.2500 mg | Freq: Every day | TRANSDERMAL | 3 refills | Status: DC
Start: 2023-05-26 — End: 2023-11-23
  Filled 2023-05-26 – 2023-05-30 (×2): qty 75, 30d supply, fill #0
  Filled 2023-06-02 – 2023-06-17 (×2): qty 75, 1d supply, fill #0
  Filled 2023-06-27: qty 75, 30d supply, fill #0
  Filled 2023-08-15: qty 75, 30d supply, fill #1
  Filled 2023-09-21 – 2023-09-23 (×3): qty 75, 30d supply, fill #2
  Filled 2023-10-14: qty 75, 60d supply, fill #2
  Filled 2023-10-23: qty 75, 30d supply, fill #2

## 2023-05-26 NOTE — Nursing Note (Signed)
Message  Received: Today  Cyril Loosen, MD  Cheral Marker, CLINICAL CARE COORDINATOR  Sure.          Previous Messages       ----- Message -----  From: Cheral Marker, CLINICAL CARE COORDINATOR  Sent: 05/26/2023   2:28 PM EDT  To: Cyril Loosen, MD    Pt requesting  Requested Renewals    testosterone (ANDROGEL) 20.25 mg/1.25 gram (1.62 %) Transdermal Gel in Metered-dose Pump    Sig: Place 1 pump on the skin Once a day as directed          Labs from April 2024 -  H&H 12.1 / 38.5  Testosterone total -331    PSA 1.16 .    Please advise if okay to refill at current dose?    Thanks  Leonard [name]           Androgel prescription prepped and routed to Dr Lambert Mody for review/signature.     Irine Heminger, CLINICAL CARE COORDINATOR

## 2023-05-27 ENCOUNTER — Encounter (HOSPITAL_BASED_OUTPATIENT_CLINIC_OR_DEPARTMENT_OTHER): Payer: Self-pay

## 2023-05-27 ENCOUNTER — Other Ambulatory Visit: Payer: Self-pay

## 2023-05-28 ENCOUNTER — Other Ambulatory Visit: Payer: Self-pay

## 2023-05-28 MED ORDER — SACUBITRIL 49 MG-VALSARTAN 51 MG TABLET
1.0000 | ORAL_TABLET | Freq: Two times a day (BID) | ORAL | 3 refills | Status: DC
Start: 2023-05-28 — End: 2024-05-18
  Filled 2023-05-28: qty 180, 90d supply, fill #0
  Filled 2023-08-24: qty 180, 90d supply, fill #1
  Filled 2023-11-23: qty 180, 90d supply, fill #2
  Filled 2024-03-01: qty 180, 90d supply, fill #3

## 2023-05-28 MED ORDER — SPIRONOLACTONE 25 MG TABLET
25.0000 mg | ORAL_TABLET | Freq: Every morning | ORAL | 3 refills | Status: DC
Start: 2023-05-28 — End: 2024-06-30
  Filled 2023-05-28: qty 90, 90d supply, fill #0
  Filled 2023-08-24: qty 90, 90d supply, fill #1
  Filled 2023-11-23: qty 90, 90d supply, fill #2
  Filled 2024-03-01: qty 90, 90d supply, fill #3

## 2023-05-28 NOTE — Telephone Encounter (Signed)
-----   Message from Chanetta Marshall sent at 05/27/2023  4:57 PM EDT -----  Regarding: Leonard [name] medication  Contact: [phone]  Hello Dr Arrie Eastern, I am in need of a refill of my Entresto pretty soon. Can you please help me with this request? I also need my spirolactone filled. Thank you

## 2023-05-29 ENCOUNTER — Other Ambulatory Visit: Payer: Self-pay

## 2023-05-30 ENCOUNTER — Other Ambulatory Visit: Payer: Self-pay

## 2023-05-31 ENCOUNTER — Other Ambulatory Visit: Payer: Self-pay

## 2023-06-02 ENCOUNTER — Encounter (HOSPITAL_COMMUNITY): Payer: Self-pay

## 2023-06-02 ENCOUNTER — Other Ambulatory Visit: Payer: Self-pay

## 2023-06-03 ENCOUNTER — Encounter (HOSPITAL_COMMUNITY): Payer: Self-pay

## 2023-06-04 ENCOUNTER — Encounter (HOSPITAL_COMMUNITY): Payer: Self-pay

## 2023-06-05 ENCOUNTER — Encounter (HOSPITAL_COMMUNITY): Payer: Self-pay

## 2023-06-09 ENCOUNTER — Encounter (HOSPITAL_COMMUNITY): Payer: Self-pay

## 2023-06-09 ENCOUNTER — Inpatient Hospital Stay (HOSPITAL_COMMUNITY)
Admission: RE | Admit: 2023-06-09 | Discharge: 2023-06-09 | Disposition: A | Discharge status: Home / Self Care | Payer: MEDICAID | Source: Ambulatory Visit

## 2023-06-09 HISTORY — DX: Dependence on other enabling machines and devices: Z99.89

## 2023-06-17 ENCOUNTER — Other Ambulatory Visit: Payer: Self-pay

## 2023-06-17 DIAGNOSIS — Z1211 Encounter for screening for malignant neoplasm of colon: Secondary | ICD-10-CM

## 2023-06-18 ENCOUNTER — Other Ambulatory Visit: Payer: Self-pay

## 2023-06-19 ENCOUNTER — Ambulatory Visit (HOSPITAL_COMMUNITY): Payer: MEDICAID | Admitting: ANESTHESIOLOGY

## 2023-06-19 ENCOUNTER — Inpatient Hospital Stay
Admission: RE | Admit: 2023-06-19 | Discharge: 2023-06-19 | Disposition: A | Discharge status: Home / Self Care | Payer: MEDICAID | Source: Ambulatory Visit | Attending: Gastroenterology | Admitting: Gastroenterology

## 2023-06-19 ENCOUNTER — Other Ambulatory Visit: Payer: Self-pay

## 2023-06-19 ENCOUNTER — Encounter (HOSPITAL_COMMUNITY): Payer: Self-pay | Admitting: Gastroenterology

## 2023-06-19 ENCOUNTER — Ambulatory Visit (HOSPITAL_BASED_OUTPATIENT_CLINIC_OR_DEPARTMENT_OTHER): Payer: MEDICAID | Admitting: ANESTHESIOLOGY

## 2023-06-19 ENCOUNTER — Encounter (HOSPITAL_COMMUNITY)
Admission: RE | Disposition: A | Discharge status: Home / Self Care | Payer: Self-pay | Source: Ambulatory Visit | Attending: Gastroenterology

## 2023-06-19 ENCOUNTER — Encounter (HOSPITAL_COMMUNITY): Payer: MEDICAID | Admitting: Gastroenterology

## 2023-06-19 DIAGNOSIS — E119 Type 2 diabetes mellitus without complications: Secondary | ICD-10-CM | POA: Insufficient documentation

## 2023-06-19 DIAGNOSIS — D122 Benign neoplasm of ascending colon: Secondary | ICD-10-CM | POA: Insufficient documentation

## 2023-06-19 DIAGNOSIS — Z79891 Long term (current) use of opiate analgesic: Secondary | ICD-10-CM | POA: Insufficient documentation

## 2023-06-19 DIAGNOSIS — M1990 Unspecified osteoarthritis, unspecified site: Secondary | ICD-10-CM | POA: Insufficient documentation

## 2023-06-19 DIAGNOSIS — K219 Gastro-esophageal reflux disease without esophagitis: Secondary | ICD-10-CM | POA: Insufficient documentation

## 2023-06-19 DIAGNOSIS — K635 Polyp of colon: Secondary | ICD-10-CM

## 2023-06-19 DIAGNOSIS — D509 Iron deficiency anemia, unspecified: Secondary | ICD-10-CM

## 2023-06-19 DIAGNOSIS — K5730 Diverticulosis of large intestine without perforation or abscess without bleeding: Secondary | ICD-10-CM

## 2023-06-19 DIAGNOSIS — R195 Other fecal abnormalities: Secondary | ICD-10-CM

## 2023-06-19 DIAGNOSIS — G4730 Sleep apnea, unspecified: Secondary | ICD-10-CM | POA: Insufficient documentation

## 2023-06-19 DIAGNOSIS — K56699 Other intestinal obstruction unspecified as to partial versus complete obstruction: Secondary | ICD-10-CM

## 2023-06-19 DIAGNOSIS — Z7982 Long term (current) use of aspirin: Secondary | ICD-10-CM | POA: Insufficient documentation

## 2023-06-19 DIAGNOSIS — Z9989 Dependence on other enabling machines and devices: Secondary | ICD-10-CM | POA: Insufficient documentation

## 2023-06-19 DIAGNOSIS — I110 Hypertensive heart disease with heart failure: Secondary | ICD-10-CM | POA: Insufficient documentation

## 2023-06-19 DIAGNOSIS — Z9049 Acquired absence of other specified parts of digestive tract: Secondary | ICD-10-CM | POA: Insufficient documentation

## 2023-06-19 DIAGNOSIS — I509 Heart failure, unspecified: Secondary | ICD-10-CM | POA: Insufficient documentation

## 2023-06-19 LAB — POC BLOOD GLUCOSE (RESULTS)
GLUCOSE, POC: 89 mg/dl (ref 65–125)
GLUCOSE, POC: 89 mg/dl (ref 65–125)

## 2023-06-19 SURGERY — COLONOSCOPY
Anesthesia: Monitor Anesthesia Care | Site: Anus | Wound class: Clean Contaminated Wounds-The respiratory, GI, Genital, or urinary

## 2023-06-19 MED ORDER — PROPOFOL 10 MG/ML IV BOLUS
INJECTION | Freq: Once | INTRAVENOUS | Status: DC | PRN
Start: 2023-06-19 — End: 2023-06-19
  Administered 2023-06-19: 150 mg via INTRAVENOUS

## 2023-06-19 MED ORDER — SODIUM CHLORIDE 0.9 % (FLUSH) INJECTION SYRINGE
2.0000 mL | INJECTION | Freq: Three times a day (TID) | INTRAMUSCULAR | Status: DC
Start: 2023-06-19 — End: 2023-06-19

## 2023-06-19 MED ORDER — DEXTROSE 5% IN WATER (D5W) FLUSH BAG - 250 ML
INTRAVENOUS | Status: DC | PRN
Start: 2023-06-19 — End: 2023-06-19

## 2023-06-19 MED ORDER — PROPOFOL 10 MG/ML INTRAVENOUS EMULSION
INTRAVENOUS | Status: AC
Start: 2023-06-19 — End: 2023-06-19
  Filled 2023-06-19: qty 20

## 2023-06-19 MED ORDER — SODIUM CHLORIDE 0.9 % (FLUSH) INJECTION SYRINGE
2.0000 mL | INJECTION | INTRAMUSCULAR | Status: DC | PRN
Start: 2023-06-19 — End: 2023-06-19

## 2023-06-19 MED ORDER — LACTATED RINGERS INTRAVENOUS SOLUTION
INTRAVENOUS | Status: DC
Start: 2023-06-19 — End: 2023-06-19

## 2023-06-19 MED ORDER — ONDANSETRON HCL (PF) 4 MG/2 ML INJECTION SOLUTION
4.0000 mg | Freq: Once | INTRAMUSCULAR | Status: DC | PRN
Start: 2023-06-19 — End: 2023-06-19

## 2023-06-19 MED ORDER — LIDOCAINE (PF) 20 MG/ML (2 %) INJECTION SOLUTION
INTRAMUSCULAR | Status: AC
Start: 2023-06-19 — End: 2023-06-19
  Filled 2023-06-19: qty 5

## 2023-06-19 MED ORDER — PROPOFOL 10 MG/ML INTRAVENOUS EMULSION
INTRAVENOUS | Status: DC | PRN
Start: 2023-06-19 — End: 2023-06-19
  Administered 2023-06-19: 200 ug/kg/min via INTRAVENOUS
  Administered 2023-06-19: 75 ug/kg/min via INTRAVENOUS
  Administered 2023-06-19: 0 ug/kg/min via INTRAVENOUS
  Administered 2023-06-19: 100 ug/kg/min via INTRAVENOUS
  Administered 2023-06-19: 25 ug/kg/min via INTRAVENOUS
  Administered 2023-06-19: 50 ug/kg/min via INTRAVENOUS

## 2023-06-19 MED ORDER — LIDOCAINE (PF) 100 MG/5 ML (2 %) INTRAVENOUS SYRINGE
INJECTION | Freq: Once | INTRAVENOUS | Status: DC | PRN
Start: 2023-06-19 — End: 2023-06-19
  Administered 2023-06-19: 100 mg via INTRAVENOUS

## 2023-06-19 MED ORDER — SODIUM CHLORIDE 0.9% FLUSH BAG - 250 ML
INTRAVENOUS | Status: DC | PRN
Start: 2023-06-19 — End: 2023-06-19

## 2023-06-19 MED ORDER — PROPOFOL 10 MG/ML INTRAVENOUS EMULSION
INTRAVENOUS | Status: AC
Start: 2023-06-19 — End: 2023-06-19
  Filled 2023-06-19: qty 50

## 2023-06-19 SURGICAL SUPPLY — 14 items
CATHETER SPRAY MT ENDO S LTX-FREE DISP BLU HNDL 1.8MM OD (MED SURG SUPPLIES) ×1 IMPLANT
DUPE USE ITEM 111598 - SNARE 15MM CAPTIVATR II ENDOS PLPCTM STRL LF  DISP (ENDOSCOPIC SUPPLIES) ×1 IMPLANT
ELECTRODE PATIENT RTN 9FT VLAB C30- LB RM PHSV ACRL FOAM CORD NONIRRITATE NONSENSITIZE ADH STRP (SURGICAL CUTTING SUPPLIES) ×1 IMPLANT
FORCEPS ENDOS 240CM 2.8MM RJ 4 JMB DISP (ENDOSCOPIC SUPPLIES) IMPLANT
HEMOSTAT ABS PURASTAT 3ML (WOUND CARE SUPPLY) ×1 IMPLANT
KIT ENDOSCOPIC CLEANING COMPLIANCE ENDOKIT W/1.1OZ BOWL BLUE (ENDOSCOPIC SUPPLIES) ×1 IMPLANT
LIFTER SURG BLUEBOOST SUBMUCOSAL SYRG INJ 10ML (SURGICAL CUTTING SUPPLIES) ×1 IMPLANT
NEEDLE ENDOS 5MM 25GA 2.5MM SS TFLN 230CM INJ PRJ SHEATH LL SPRG LD HNDL CRLK STRL LF  DISP (ENDOSCOPIC SUPPLIES) ×1 IMPLANT
SNARE 15MM CAPTIVATR II ENDOS PLPCTM STRL LF  DISP (ENDOSCOPIC SUPPLIES) ×1
SNARE 9MM 230CM 2.4MM EXACTO COLD BRD WRE CLEAN CUT ENDOS PLC RESCT STRL LF  DISP (ENDOSCOPIC SUPPLIES) ×1 IMPLANT
SNARE MED OVAL 240CM 2.4MM SNS LOOP SHRTHRW FLXB ENDOS 2.8MM WRK CHNL PLPCTM 27MM STRL DISP (ENDOSCOPIC SUPPLIES) IMPLANT
SNARE SM OVAL 240CM 2.4MM SNS LOOP SHRTHRW FLXB ENDOS PLPCTM 13MM STRL LF  DISP (ENDOSCOPIC SUPPLIES) IMPLANT
SNARE SPRL 230CM 2.8MM 20MM SNAREMASTER RIDGE WRE ENDOS PLPCTM STRL LF  DISP (ENDOSCOPIC SUPPLIES) ×1 IMPLANT
TRAP SPECI REM 15CM ETRAP MAGNIFY WIND MSR GUIDE PLPCTM STRL LF  DISP (SPECIMEN COLLECTION SUPPLIES) ×1 IMPLANT

## 2023-06-19 NOTE — OR PreOp (Signed)
Pt's mother in law is going to come pick him up when procedure is done and he will go be with his brother later. Initially he had said his brother was coming, but his brother has to work. Instructed pt that he can not drive today and must have someone with him til tomorrow. He verbalized understanding and that he would be with his brother.

## 2023-06-19 NOTE — Discharge Instructions (Signed)
SURGICAL DISCHARGE INSTRUCTIONS     Dr. Shanna Cisco, MD  performed your COLONOSCOPY on demand today at the [facility]    [facility]:  Monday through Friday from 6 a.m. - 7 p.m.: [phone]  Between 7 p.m. - 6 a.m., weekends and holidays:  Call [facility] at [phone] or [phone].    PLEASE SEE WRITTEN HANDOUTS AS DISCUSSED BY YOUR NURSE:      SIGNS AND SYMPTOMS OF A WOUND / INCISION INFECTION   Be sure to watch for the following:  Increase in redness or red streaks near or around the wound or incision.  Increase in pain that is intense or severe and cannot be relieved by the pain medication that your doctor has given you.  Increase in swelling that cannot be relieved by elevation of a body part, or by applying ice, if permitted.  Increase in drainage, or if yellow / green in color and smells bad. This could be on a dressing or a cast.  Increase in fever for longer than 24 hours, or an increase that is higher than 101 degrees Fahrenheit (normal body temperature is 98 degrees Fahrenheit). The incision may feel warm to the touch.    **CALL YOUR DOCTOR IF ONE OR MORE OF THESE SIGNS / SYMPTOMS SHOULD OCCUR.    ANESTHESIA INFORMATION   ANESTHESIA -- ADULT PATIENTS:  You have received intravenous sedation / general anesthesia, and you may feel drowsy and light-headed for several hours. You may even experience some forgetfulness of the procedure. DO NOT DRIVE A MOTOR VEHICLE or perform any activity requiring complete alertness or coordination until you feel fully awake in about 24-48 hours. Do not drink alcoholic beverages for at least 24 hours. Do not stay alone, you must have a responsible adult available to be with you. You may also experience a dry mouth or nausea for 24 hours. This is a normal side effect and will disappear as the effects of the medication wear off.    REMEMBER   If you experience any difficulty breathing, chest pain, bleeding that you feel is excessive,  persistent nausea or vomiting or for any other concerns:  Call your physician Dr. Tedra Senegal at [phone] or [phone]. You may also ask to have the doctor on call paged. They are available to you 24 hours a day.    SPECIAL INSTRUCTIONS / COMMENTS       FOLLOW-UP APPOINTMENTS   Please call patient services at [phone] or [phone] to schedule a date / time of return. They are open Monday - Friday from 7:30 am - 5:00 pm.

## 2023-06-19 NOTE — OR Nursing (Signed)
Tolerated well

## 2023-06-19 NOTE — Anesthesia Transfer of Care (Signed)
ANESTHESIA TRANSFER OF CARE   Leonard [name] is a 52 y.o. ,male, Weight: 98.7 kg (217 lb 9.5 oz)   had Procedure(s):  COLONOSCOPY on demand  performed  06/19/23   Primary Service: Shanna Cisco, MD    Past Medical History:   Diagnosis Date    Anxiety     CPAP (continuous positive airway pressure) dependence     Depression     Diabetes mellitus type II, non insulin dependent (CMS HCC)     Diverticulosis of colon     GERD (gastroesophageal reflux disease)     Heart failure (CMS HCC)     Hip pain     HTN (hypertension)     Low testosterone     Osteoarthritis     Ritalin use disorder, severe (CMS HCC)     Severe alcohol use disorder (CMS HCC)     Severe benzodiazepine use disorder (CMS HCC)     Sleep apnea     Tobacco abuse     Type 2 diabetes mellitus (CMS HCC)       Allergy History as of 06/19/23       CAT DANDER         Noted Status Severity Type Reaction    01/06/23 1407 Arnell Asal, Ambulatory Care Assistant 01/06/23 Active Medium Intolerance Rash, Itching, Swelling                  I completed my transfer of care / handoff to the receiving personnel during which we discussed:  Access, Airway, All key/critical aspects of case discussed, Analgesia, Fluids/Product, Expectation of post procedure, Gave opportunity for questions and acknowledgement of understanding and PMHx      Post Location: Phase II                                                             Last OR Temp: Temperature: 36.3 C (97.3 F)  ABG:  POTASSIUM   Date Value Ref Range Status   03/06/2023 4.6 3.5 - 5.1 mmol/L Final     KETONES   Date Value Ref Range Status   07/10/2022 Negative Negative mg/dL Final     CALCIUM   Date Value Ref Range Status   03/06/2023 8.0 (L) 8.6 - 10.2 mg/dL Final     Comment:     Gadolinium-containing contrast can interfere with calcium measurement.       Calculated P Axis   Date Value Ref Range Status   11/13/2022 76 degrees Final     Calculated R Axis   Date Value Ref Range Status   11/13/2022 66 degrees Final      Calculated T Axis   Date Value Ref Range Status   11/13/2022 78 degrees Final     Airway:* No LDAs found *  Blood pressure 108/77, pulse 72, temperature 36.3 C (97.3 F), resp. rate 20, height 1.829 m (6'), weight 98.7 kg (217 lb 9.5 oz), SpO2 96%.

## 2023-06-19 NOTE — Anesthesia Preprocedure Evaluation (Signed)
ANESTHESIA PRE-OP EVALUATION  Planned Procedure: COLONOSCOPY on demand (Anus)  Review of Systems     anesthesia history negative     patient summary reviewed  nursing notes reviewed        Pulmonary   sleep apnea and CPAP,   Cardiovascular    Hypertension , Exercise Tolerance: > or = 4 METS        GI/Hepatic/Renal           Endo/Other    osteoarthritis,   type 2 diabetes    Neuro/Psych/MS        Cancer                        Physical Assessment      Airway       Mallampati: III    TM distance: >3 FB    Neck ROM: full  Mouth Opening: good.            Dental       Dentition intact             Pulmonary    Breath sounds clear to auscultation       Cardiovascular    Rhythm: regular  Rate: Normal       Other findings              Plan  ASA 3     Planned anesthesia type: MAC                         Intravenous induction     Anesthesia issues/risks discussed are: Dental Injuries, Cardiac Events/MI, Stroke, Intraoperative Awareness/ Recall, Aspiration and PONV.  Anesthetic plan and risks discussed with patient  signed consent obtained          Patient's NPO status is appropriate for Anesthesia.           Plan discussed with CRNA.

## 2023-06-19 NOTE — H&P (Signed)
[facility]  GI Admission History and Physical      Leonard [name], Leonard [name]   MRN:  [id number]  Date of Birth:  12-Dec-1970    Date of Procedure:  06/19/2023    Chief Complaint: Anemia and Heme positive stool    HPI: Leonard [name], Leonard [name] is a 52 y.o. year old male who presents today for  Colonoscopy . History of positive FIT test and anemia.    Past Medical History:   Diagnosis Date    Anxiety     CPAP (continuous positive airway pressure) dependence     Depression     Diabetes mellitus type II, non insulin dependent (CMS HCC)     Diverticulosis of colon     GERD (gastroesophageal reflux disease)     Heart failure (CMS HCC)     Hip pain     HTN (hypertension)     Low testosterone     Osteoarthritis     Ritalin use disorder, severe (CMS HCC)     Severe alcohol use disorder (CMS HCC)     Severe benzodiazepine use disorder (CMS HCC)     Sleep apnea     Tobacco abuse     Type 2 diabetes mellitus (CMS HCC)            Allergies   Allergen Reactions    Cat Dander Rash, Itching and Swelling       Medications Prior to Admission       Prescriptions    acetaminophen (TYLENOL) 325 mg Oral Tablet    Take 2 Tablets (650 mg total) by mouth Every 6 hours as needed for Pain    aspirin (ECOTRIN) 81 mg Oral Tablet, Delayed Release (E.C.)    Take 1 Tablet (81 mg total) by mouth Twice daily    DULoxetine (CYMBALTA DR) 30 mg Oral Capsule, Delayed Release(E.C.)    Take 1 Capsule (30 mg total) by mouth Once a day    meloxicam (MOBIC) 15 mg Oral Tablet    Take 1 Tablet (15 mg total) by mouth Once a day    metoprolol succinate (TOPROL-XL) 25 mg Oral Tablet Sustained Release 24 hr    Take 1 Tablet (25 mg total) by mouth Once a day    naloxone (KLOXXADO) 8 mg/actuation Nasal Spray, Non-Aerosol    0.1 mL (8 mg total) by nasal (alternating) route Every 2 minutes as needed for actual or suspected opioid overdose. Alternate nostrils if needing more than one dose. Call 911 if used.    ondansetron (ZOFRAN) 4 mg Oral Tablet    Take 1 Tablet (4 mg total) by  mouth Every 6 hours as needed for Nausea/Vomiting    oxyCODONE (ROXICODONE) 5 mg Oral Tablet    Take 1-2 Tablets (5-10 mg total) by mouth Every 4 hours as needed for Pain    PEG 3350-Electrolytes 236-22.74-6.74 -5.86 gram Oral Recon Soln    Take 4,000 mL by mouth One time for 1 dose    sacubitriL-valsartan (ENTRESTO) 49-51 mg Oral Tablet    Take 1 Tablet by mouth Twice daily    sennosides-docusate sodium (SENOKOT-S) 8.6-50 mg Oral Tablet    Take 1 Tablet by mouth Twice daily    Sildenafil (VIAGRA) 25 mg Oral Tablet    Take 1 Tablet (25 mg total) by mouth Every 24 hours as needed (1 hour before intercourse)    Sodium Fluoride (PREVIDENT 5000 BOOSTER PLUS) 1.1 % Dental Paste    Instead of toothpaste use, brush on fluoride for 3  minutes and spit out. Do not eat/drink for 30 minutes after use.    spironolactone (ALDACTONE) 25 mg Oral Tablet    Take 1 Tablet (25 mg total) by mouth Every morning with breakfast    testosterone (ANDROGEL) 20.25 mg/1.25 gram (1.62 %) Transdermal Gel in Metered-dose Pump    Place 1 pump on the skin Once a day as directed    testosterone (ANDROGEL) 20.25 mg/1.25 gram (1.62 %) Transdermal Gel in Metered-dose Pump    Place 1 pump on the skin Once a day as directed    traZODone (DESYREL) 100 mg Oral Tablet    Take 1 Tablet (100 mg total) by mouth Every night             Past Surgical History:   Procedure Laterality Date    COLECTOMY PARTIAL / TOTAL      x3    HX HIP REPLACEMENT  03/05/2023    INGUINAL HERNIA REPAIR  1990           Physical Exam:  HEENT: Airway patent, Trachea midline, No large goiters or neck lymphadenopathy  Heart: Regular rate and rhythm, no murmurs  Lungs: Clear to auscultation bilaterally without decreased breath sounds, rhonchi, or wheezes  Abdomen: Soft, nondistended, positive bowel sounds, nontender    Assessment:  Anemia and Heme positive stool    Plan:  Proceed with  Colonoscopy .     Orders Placed This Encounter    POCT WHOLE BLOOD GLUCOSE    REMOVE SALINE LOCK     INSERT & MAINTAIN PERIPHERAL IV ACCESS    PERIPHERAL IV DRESSING CHANGE    NS flush syringe    NS flush syringe    NS 250 mL flush bag    D5W 250 mL flush bag    LR premix infusion         Vibhu Chittajallu, MD

## 2023-06-19 NOTE — OR Nursing (Signed)
Received from area C via cart; procedure questions answered

## 2023-06-19 NOTE — Anesthesia Postprocedure Evaluation (Signed)
Anesthesia Post Op Evaluation    Patient: Leonard [name]  Procedure(s):  COLONOSCOPY on demand    Last Vitals:Temperature: 36.3 C (97.3 F) (06/19/23 0819)  Heart Rate: 72 (06/19/23 0819)  BP (Non-Invasive): 108/77 (06/19/23 0819)  Respiratory Rate: 20 (06/19/23 0819)  SpO2: 96 % (06/19/23 0819)    No notable events documented.    Patient is sufficiently recovered from the effects of anesthesia to participate in the evaluation and has returned to their pre-procedure level.  Patient location during evaluation: PACU       Patient participation: complete - patient participated  Level of consciousness: awake and alert and responsive to verbal stimuli    Pain management: adequate  Airway patency: patent    Anesthetic complications: no  Cardiovascular status: acceptable  Respiratory status: acceptable  Hydration status: acceptable  Patient post-procedure temperature: Pt Normothermic   PONV Status: Absent

## 2023-06-20 DIAGNOSIS — D122 Benign neoplasm of ascending colon: Secondary | ICD-10-CM

## 2023-06-20 DIAGNOSIS — K635 Polyp of colon: Secondary | ICD-10-CM

## 2023-06-20 LAB — SURGICAL PATHOLOGY SPECIMEN

## 2023-06-27 ENCOUNTER — Other Ambulatory Visit: Payer: Self-pay

## 2023-06-30 ENCOUNTER — Encounter (INDEPENDENT_AMBULATORY_CARE_PROVIDER_SITE_OTHER): Payer: Self-pay

## 2023-06-30 ENCOUNTER — Other Ambulatory Visit (INDEPENDENT_AMBULATORY_CARE_PROVIDER_SITE_OTHER): Payer: Self-pay

## 2023-06-30 ENCOUNTER — Other Ambulatory Visit: Payer: Self-pay

## 2023-06-30 NOTE — Nursing Note (Signed)
Case request placed for colonoscopy with Dr. Theodora Blow in 3 months.  Roselie Skinner, FNP-C

## 2023-07-02 ENCOUNTER — Other Ambulatory Visit (INDEPENDENT_AMBULATORY_CARE_PROVIDER_SITE_OTHER): Payer: Self-pay

## 2023-07-02 ENCOUNTER — Other Ambulatory Visit: Payer: Self-pay

## 2023-07-02 MED ORDER — PEG 3350-ELECTROLYTES 236 GRAM-22.74 GRAM-6.74 GRAM-5.86 GRAM SOLUTION
4.0000 L | Freq: Every day | ORAL | 0 refills | Status: AC
Start: 2023-07-02 — End: 2023-07-04
  Filled 2023-07-02: qty 8000, 2d supply, fill #0

## 2023-07-14 ENCOUNTER — Other Ambulatory Visit: Payer: Self-pay

## 2023-07-14 ENCOUNTER — Encounter (INDEPENDENT_AMBULATORY_CARE_PROVIDER_SITE_OTHER): Payer: Self-pay | Admitting: Urology

## 2023-07-14 ENCOUNTER — Ambulatory Visit (INDEPENDENT_AMBULATORY_CARE_PROVIDER_SITE_OTHER): Payer: MEDICAID | Attending: Urology | Admitting: PHYSICIAN ASSISTANT

## 2023-07-14 VITALS — BP 121/78 | HR 80 | Temp 96.6°F | Ht 72.0 in | Wt 221.8 lb

## 2023-07-14 DIAGNOSIS — Z79890 Hormone replacement therapy (postmenopausal): Secondary | ICD-10-CM | POA: Insufficient documentation

## 2023-07-14 DIAGNOSIS — E291 Testicular hypofunction: Secondary | ICD-10-CM

## 2023-07-14 DIAGNOSIS — R5383 Other fatigue: Secondary | ICD-10-CM | POA: Insufficient documentation

## 2023-07-14 DIAGNOSIS — R7989 Other specified abnormal findings of blood chemistry: Secondary | ICD-10-CM | POA: Insufficient documentation

## 2023-07-14 DIAGNOSIS — R6882 Decreased libido: Secondary | ICD-10-CM | POA: Insufficient documentation

## 2023-07-14 LAB — POC URINALYSIS (RESULTS)
BILIRUBIN: NEGATIVE mg/dL
BLOOD: NEGATIVE mg/dL
GLUCOSE: NEGATIVE mg/dL
KETONES: NEGATIVE mg/dL
LEUKOCYTES: NEGATIVE WBCs/uL
NITRITE: NEGATIVE
PH: 5.5 (ref 5.0–8.0)
PROTEIN: NEGATIVE mg/dL
SPECIFIC GRAVITY: 1.025 (ref 1.005–1.030)
UROBILINOGEN: 0.2 mg/dL

## 2023-07-14 NOTE — Progress Notes (Signed)
[facility]  DIVISION OF UROLOGY CLINIC  PROGRESS NOTE    Name: Leonard [name]  MRN: [id number]  Date of Birth: 12/12/1970  Date of Consultation: 07/14/2023    CC: Follow-up for previous low T levels of 180, 199, and 184 w/ fatigue and low libido/sex drive, medication check for Androgel.    SUBJECTIVE: Leonard [name] is a 52 y.o. 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 Patient was seen previously by Dr. Lambert Mody on 01/06/2023, at that time baseline PSA was collected- 1.16, testosterone level as 200 ng/dL, patient was started on AndroGel 1.62%, and a repeat testosterone level on 02/18/23 was normal at 331 ng/dL.      Since the time of this appointment patient has also had a recent left hip replacement 03/05/2023, states he has been feeling a bit more energetic and is able to walk around much easier, patient does endorse an intentional weight loss of 40 lb over the past year, reports being alcohol free for the past year as well. Patient states he has received a prior diagnosis of obstructive sleep apnea, is in process of getting a CPAP machine to wear at night.  Besides feeling mildly more energetic, patient states he also notices better erectile function, however, with only mild benefit to his low libido.  Patient denies having any other symptoms possibly associated with low T, has several sources of fatigue- is addressing many of them already besides OSA currently.    Patient also has chronic microcytic/hypochromic anemia on blood work, being monitored by PCP, colonoscopy up to date, could also be a source of fatigue.    ROS:  See subjective for pertinent GU symptoms, all  other systems negative.    PHYSICAL EXAM:  BP 121/78   Pulse 80   Temp (!) 35.9 C (96.6 F) (Tympanic)   Ht 1.829 m (6')   Wt 101 kg (221 lb 12.5 oz)   SpO2 98%   BMI 30.08 kg/m     VS: Appears vitally stable.  General - 52 y.o. male, NAD.  Lungs - Unlabored respiratory effort, equal chest rise, no audible wheeze.  Musculoskeletal - FROM in all 4 extremities.  Neurological - CN II-XII Grossly intact.  Skin - Warm and dry.  Psych - Normal affect.    UA 07/14/23:    Component  Ref Range & Units 11:35   COLOR  Yellow Yellow   CLARITY  Clear Clear   GLUCOSE  Negative mg/dL Negative   BILIRUBIN  Negative mg/dL Negative   KETONES  Negative mg/dL Negative   SPECIFIC GRAVITY  1.005 - 1.030 1.025   BLOOD  Negative mg/dL Negative   PH  5.0 - <9.6 5.5   PROTEIN  Negative, Trace mg/dL Negative   UROBILINOGEN  0.2 , 1.0 mg/dL 0.2   NITRITE  Negative Negative   LEUKOCYTES  Negative WBCs/uL Negative       Lab Results   Component Value Date    WBC 8.2 03/06/2023    WBC 8.9 02/18/2023    WBC 9.4 10/22/2022    HGB 8.2 (L) 03/06/2023    HGB 12.1 (L) 02/18/2023    HGB 13.1 (L) 10/22/2022    HA1C  5.3 10/22/2022    HA1C 5.6 06/26/2022    CREATININE 1.31 03/06/2023    CREATININE 1.42 (H) 02/18/2023    CREATININE 1.42 (H) 10/22/2022    GFR 65 03/06/2023    GFR 59 (L) 02/18/2023    GFR 59 (L) 10/22/2022     PROSTATE SPECIFIC ANTIGEN   Lab Results   Component Value Date/Time    PROSSPECAG 1.16 01/06/2023 02:57 PM          Last HA1C on chart review 5.3 on 10/22/22.       ASSESSMENT:  52 y.o. 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  - CONTINUE HRT with  Androgel 1.62% one pump daily, refills recently provided.  - Counseling was provided appropriate counseling regarding caution with prolonged use of HRT considering the potential for increased risk in stressing the prostate and worsening any underlying/developing prostate cancer.  Patient's last PSA was 1.16 baseline before HRT, we will continue to monitor PSA level annually to ensure HRT is not stressing the prostate.  - I recommend follow-up in 6 months for a medication check for AndroGel and a repeat PSA level, a six-month follow-up will forward [place] the opportunity to check patient's PSA in exactly 1 year from initial baseline value.  - Patient was counseled on continuing to minimize all potential sources of fatigue and low libido including addressing his mental health, continuing to lose weight and keep his HA1C low, drinking plenty of water, and addressing his sleep apnea.  - Patient was agreeable to this care plan, was given the opportunity to ask questions, expressed understanding and was satisfied with his care today 07/14/2023.    Return in about 6 months (around 01/11/2024) for medication & PSA check .    Debby Bud, Urology APP Fellow  [facility]  Department of Urology   07/14/2023 11:55    This patient was seen in a shared visit between APP in attending physician.    I have seen Chanetta Marshall after I have reviewed the information gathered from my mid-level provider.  I have reviewed and agree with the:    * History of present illness  * Past medical history  * Past surgical history  * Family history  * Allergies  * Review of systems  * Physical examination.  Additionally, I have also examined Chanetta Marshall and my findings are below:  * Abdomen soft, NT, ND, No CVAT or Suprapubic fullness    Urine Dip Results:           My findings and participation are:    Assessment low T.    Plan: continue current care plan.    Cyril Loosen, MD, MBA, FACS  Professor and Johnanna Schneiders  Department  of Urology    Certified in Pain Management  [employer] in Traumatic Stress

## 2023-07-21 ENCOUNTER — Other Ambulatory Visit: Payer: Self-pay

## 2023-07-22 ENCOUNTER — Other Ambulatory Visit: Payer: Self-pay

## 2023-07-30 ENCOUNTER — Other Ambulatory Visit: Payer: Self-pay

## 2023-07-30 ENCOUNTER — Ambulatory Visit: Payer: MEDICAID | Attending: Internal Medicine | Admitting: SLEEP MEDICINE

## 2023-07-30 VITALS — BP 113/64 | HR 63 | Temp 96.9°F | Resp 20 | Ht 72.0 in | Wt 220.5 lb

## 2023-07-30 DIAGNOSIS — G4733 Obstructive sleep apnea (adult) (pediatric): Secondary | ICD-10-CM

## 2023-07-30 NOTE — Progress Notes (Signed)
Pulmonary Medicine Clinic, Return Visit  Patient: Nazire Chisman, 52 y.o. male   MRN:  [id number]  Date: 07/30/2023     Chief Complaint: Sleep Apnea    Subjective:  Brief Narrative:  52 y.o. male who presents for sleep apnea. Patient had sleep study in 10/2022 that demonstrated severe OSA (AHI 58). Patient underwent follow-up study in 02/2023 with CPAP that demonstrated improved AHI of 3.2 at pressure of 12 cm H2O. Patient reports just getting his CPAP about 1.5 weeks ago. Reports that he is "just getting used to it." He feels that the mask was initially overwhelming and made him feel like "Darth Vader." He reports that on the nights he wore the CPAP in the last week that he did not wake up throughout the night as much and felt better rested in the morning. He reports that his partner stated that his snoring seemed improved. Patient has been using a non-heated hose and is planning to try the heated hose that he was provided.    ESS - 7    Current Outpatient Medications   Medication Sig    acetaminophen (TYLENOL) 325 mg Oral Tablet Take 2 Tablets (650 mg total) by mouth Every 6 hours as needed for Pain    aspirin (ECOTRIN) 81 mg Oral Tablet, Delayed Release (E.C.) Take 1 Tablet (81 mg total) by mouth Twice daily    DULoxetine (CYMBALTA DR) 30 mg Oral Capsule, Delayed Release(E.C.) Take 1 Capsule (30 mg total) by mouth Once a day    meloxicam (MOBIC) 15 mg Oral Tablet Take 1 Tablet (15 mg total) by mouth Once a day    metoprolol succinate (TOPROL-XL) 25 mg Oral Tablet Sustained Release 24 hr Take 1 Tablet (25 mg total) by mouth Once a day    naloxone (KLOXXADO) 8 mg/actuation Nasal Spray, Non-Aerosol 0.1 mL (8 mg total) by nasal (alternating) route Every 2 minutes as needed for actual or suspected opioid overdose. Alternate nostrils if needing more than one dose. Call 911 if used.    ondansetron (ZOFRAN) 4 mg Oral Tablet Take 1 Tablet (4 mg total) by mouth Every 6 hours as needed for Nausea/Vomiting    oxyCODONE  (ROXICODONE) 5 mg Oral Tablet Take 1-2 Tablets (5-10 mg total) by mouth Every 4 hours as needed for Pain (Patient not taking: Reported on 07/14/2023)    sacubitriL-valsartan (ENTRESTO) 49-51 mg Oral Tablet Take 1 Tablet by mouth Twice daily    sennosides-docusate sodium (SENOKOT-S) 8.6-50 mg Oral Tablet Take 1 Tablet by mouth Twice daily    Sildenafil (VIAGRA) 25 mg Oral Tablet Take 1 Tablet (25 mg total) by mouth Every 24 hours as needed (1 hour before intercourse)    Sodium Fluoride (PREVIDENT 5000 BOOSTER PLUS) 1.1 % Dental Paste Instead of toothpaste use, brush on fluoride for 3 minutes and spit out. Do not eat/drink for 30 minutes after use.    spironolactone (ALDACTONE) 25 mg Oral Tablet Take 1 Tablet (25 mg total) by mouth Every morning with breakfast    testosterone (ANDROGEL) 20.25 mg/1.25 gram (1.62 %) Transdermal Gel in Metered-dose Pump Place 1 pump on the skin Once a day as directed    testosterone (ANDROGEL) 20.25 mg/1.25 gram (1.62 %) Transdermal Gel in Metered-dose Pump Place 1 pump on the skin Once a day as directed    traZODone (DESYREL) 100 mg Oral Tablet Take 1 Tablet (100 mg total) by mouth Every night       Review of Systems:  Negative except as mentioned in HPI  Objective:  BP 113/64 (Site: Right Arm, Patient Position: Sitting, Cuff Size: Adult)   Pulse 63   Temp 36.1 C (96.9 F)   Resp 20   Ht 1.829 m (6')   Wt 100 kg (220 lb 7.4 oz)   SpO2 99%   BMI 29.90 kg/m       Physical Examination:  General - No distress  HEENT - NC/AT with oral and nasal mucosa moist without erythema or injection.  Cardiovascular - Appears acyanotic, normal S1, S2 with no murmurs, rubs, or gallops  Respiratory - Clear to auscultation bilaterally, normal respiratory effort.  Integument - No overt rashes, lesions, or trauma noted  Extremities - No clubbing in BUE, No edema in BLE  Neuro - Move all four extremities  Psych - Normal affect, thought content, memory, behavior    Data reviewed:  Labs - reviewed  PSG  - 10/2022 - AHI 58  CPAP Compliance - 31% (4/13 days)  12 cm H2O.  Use > 4 hours--23%  Used 31% of nights  AHI while treated--10.6    Impression: Tryg Indelicato is a 52 y.o. male who presents to sleep clinic for sleep apnea.    Plan:  Severe OSA on CPAP  - Due to limited compliance report (13 days), encouraged patient to wear CPAP at current settings (12 cm H2O) for at least 4 hours a night 70% of nights for 3 months  - At follow-up visit in 3 months, will review a more extensive compliance report and address any concerns patient has regarding CPAP  - Encouraged patient to download myAir app for monitoring his CPAP use and cleaning tips  - Counseled on health benefits of treating sleep apnea  - Instructed to exchange mask/tubing/filters at least 2x/year, with regular cleaning in between  - Counseled on sleep hygiene    Follow-up in 3 months.      Lianne Cure, MD  PGY-2 Internal Medicine  [place] Department of Medicine  07/30/2023 11:23     I saw and examined the patient.  I reviewed the resident's note.  I agree with the findings and plan of care as documented in the resident's note.  Any exceptions/additions are edited/noted.     Lanice Shirts, MD  Assistant Professor, Division of Pulmonary, Critical Care, and Sleep Medicine  [place] [facility] Medicine  07/30/23 19:17

## 2023-08-15 ENCOUNTER — Other Ambulatory Visit: Payer: Self-pay

## 2023-08-16 ENCOUNTER — Other Ambulatory Visit: Payer: Self-pay

## 2023-08-18 ENCOUNTER — Other Ambulatory Visit: Payer: Self-pay

## 2023-08-19 ENCOUNTER — Other Ambulatory Visit: Payer: Self-pay

## 2023-08-22 ENCOUNTER — Encounter (HOSPITAL_BASED_OUTPATIENT_CLINIC_OR_DEPARTMENT_OTHER): Payer: Self-pay

## 2023-08-24 ENCOUNTER — Other Ambulatory Visit (HOSPITAL_BASED_OUTPATIENT_CLINIC_OR_DEPARTMENT_OTHER): Payer: Self-pay

## 2023-08-25 ENCOUNTER — Other Ambulatory Visit: Payer: Self-pay

## 2023-08-25 MED ORDER — METOPROLOL SUCCINATE ER 25 MG TABLET,EXTENDED RELEASE 24 HR
25.0000 mg | ORAL_TABLET | Freq: Every day | ORAL | 3 refills | Status: DC
Start: 2023-08-25 — End: 2024-08-31
  Filled 2023-08-25: qty 90, 90d supply, fill #0
  Filled 2023-11-23: qty 90, 90d supply, fill #1
  Filled 2024-02-06 – 2024-02-21 (×2): qty 90, 90d supply, fill #2
  Filled 2024-05-28: qty 90, 90d supply, fill #3

## 2023-08-26 ENCOUNTER — Other Ambulatory Visit: Payer: Self-pay

## 2023-08-28 ENCOUNTER — Encounter (INDEPENDENT_AMBULATORY_CARE_PROVIDER_SITE_OTHER): Payer: Self-pay

## 2023-08-28 ENCOUNTER — Encounter (HOSPITAL_BASED_OUTPATIENT_CLINIC_OR_DEPARTMENT_OTHER): Payer: Self-pay | Admitting: Surgical

## 2023-08-28 ENCOUNTER — Ambulatory Visit: Payer: MEDICAID | Attending: Surgical | Admitting: Surgical

## 2023-08-28 ENCOUNTER — Inpatient Hospital Stay (HOSPITAL_BASED_OUTPATIENT_CLINIC_OR_DEPARTMENT_OTHER)
Admission: RE | Admit: 2023-08-28 | Discharge: 2023-08-28 | Disposition: A | Discharge status: Home / Self Care | Payer: MEDICAID | Source: Ambulatory Visit | Admitting: Radiology

## 2023-08-28 ENCOUNTER — Other Ambulatory Visit: Payer: Self-pay

## 2023-08-28 ENCOUNTER — Ambulatory Visit (HOSPITAL_BASED_OUTPATIENT_CLINIC_OR_DEPARTMENT_OTHER): Payer: MEDICAID

## 2023-08-28 VITALS — Temp 96.3°F | Ht 72.01 in | Wt 222.0 lb

## 2023-08-28 DIAGNOSIS — M170 Bilateral primary osteoarthritis of knee: Secondary | ICD-10-CM

## 2023-08-28 MED ORDER — LIDOCAINE HCL 10 MG/ML (1 %) INJECTION SOLUTION
4.0000 mL | INTRAMUSCULAR | Status: AC
Start: 2023-08-28 — End: 2023-08-28
  Administered 2023-08-28: 40 mg via INTRAMUSCULAR

## 2023-08-28 MED ORDER — METHYLPREDNISOLONE ACETATE 40 MG/ML SUSPENSION FOR INJECTION
40.0000 mg | INTRAMUSCULAR | Status: AC
Start: 2023-08-28 — End: 2023-08-28
  Administered 2023-08-28: 40 mg via INTRA_ARTICULAR

## 2023-08-28 NOTE — Procedures (Signed)
JOINT REPLACEMENT, [place] [facility] [address]  [place] [address]  Operated by [facility]  Procedure Note    Name: Monnie Picazo MRN:  [id number]   Date: 08/28/2023 DOB:  [birth year] (52 y.o.)         Joint Asp/Inj    Performed by: Milton Ferguson, PA-C  Authorized by: Milton Ferguson, PA-C    Consent:     Consent obtained:  Verbal    Consent given by:  Patient    Risks discussed:  Bleeding, infection and pain    Alternatives discussed:  No treatment, alternative treatment and observation  Location:     Location:  Knee    Knee joint: bilateral.  Anesthesia (see MAR for exact dosages):     Anesthesia method:  Topical application  Procedure details:     Needle gauge:  22 G    Ultrasound guidance: no      Approach:  Medial    Steroid injected: yes      Specimen collected: no    Comments:      Injections were performed on bilateral knees using 4 ml of 1% plain Lidocaine and 40 mg/ml of Depo Medrol.    This patient was seen independently in clinic.  Milton Ferguson, PA-C 08/28/2023, 11:07        Milton Ferguson, PA-C

## 2023-09-01 ENCOUNTER — Encounter (HOSPITAL_COMMUNITY): Payer: Self-pay

## 2023-09-02 ENCOUNTER — Encounter (HOSPITAL_COMMUNITY): Payer: Self-pay

## 2023-09-03 ENCOUNTER — Encounter (HOSPITAL_COMMUNITY): Payer: Self-pay

## 2023-09-03 ENCOUNTER — Inpatient Hospital Stay (HOSPITAL_COMMUNITY)
Admission: RE | Admit: 2023-09-03 | Discharge: 2023-09-03 | Disposition: A | Discharge status: Home / Self Care | Payer: MEDICAID | Source: Ambulatory Visit

## 2023-09-03 HISTORY — DX: Unspecified convulsions: R56.9

## 2023-09-03 NOTE — Nurses Notes (Signed)
[facility] MEDICINE    Preoperative Evaluation Center   Department of Anesthesiology      Name: Leonard [name], Leonard [name]   DOB: Feb 02, 1971    PRE-PROCEDURE/OPERATIVE INSTRUCTIONS-ADULT GI  Preoperative Evaluation Center [facility])  [address] [place], [place] [address]    Thank you for choosing [facility] for your health care needs. [facility] is a tobacco-free campus. Please refrain from using any forms of tobacco while on the premises.     Please review upon receipt and again prior to procedure date.    If, after your PEC call or visit, you experience any of the changes below or develop any of the listed symptoms, Call the Preoperative Evaluation Center [facility]) at [phone].   Change in your best contact phone number.  Changes in your medications, especially starting a new medication.  Changes in your medical history, including an Emergency room visit, a hospital admission, or you receive a new diagnosis.    Develop any of these symptoms:  fever, cough, sore throat, shortness of breath, chills, muscle pain, new loss of taste or smell, vomiting or diarrhea, or fatigue (extreme tiredness or lack of energy).  Diagnosed with conjunctivitis (pink eye) or shingles.  Diagnosed as COVID positive.  Develop a wound, rash, open area or sores.  If you use or are prescribed an antibiotic.     Also contact your PCP to discuss your symptom(s) and the potential need for treatment/ appointments/etc.     PROCEDURE:    Procedure(s):  COLONOSCOPY    DATE of SURGERY:   September 16, 2023    ARRIVAL LOCATION:   1st Floor Registration     Arrival time and diet instructions will be given the business day prior to your procedure between 1030 am to 1 pm. If you should not hear from anyone by 2 pm the business day prior to your procedure, please call [phone] (Option 3).    Patients and visitors may park for free in the lots in front of the hospital. If you are need assistance from [place] parking lot, we have transportation available to you.  Call  security at [phone] to arrange pick up from the parking lot.    DIET INSTRUCTIONS:   Clear liquids ONLY the 2 days prior to procedure.   At 4 pm begin the bowel prep by completing half of the ordered prep.   Take the remainder of the prep, 5 hours before your scheduled arrival time.   Continue Clear liquids up to 2 hours before the arrival time of the surgery/procedure,  then NPO (NPO means NO FOOD or DRINK).    ACCEPTABLE CLEAR LIQUIDS: Water, fruit juices (white grape or apple), pedialyte, gatorade, contrast dye (limited to non-particulate forms, such as gastrografin), coffee & tea without fat/milk/creamer, and clear broth without fat or protein. [employer] [employer]     LIQUIDS NOT ACCEPTED: Orange juice, any product colored red/blue/purple, coffee or tea with fat/milk/creamer, or any alcoholic beverages.        Your diet instructions are specific for your safety. When not followed, particles left in your stomach could enter your lungs during surgery. Failure to follow the instructions could result in a delay or cancellation of your surgery.   and Special Circumstances Diet Instruction:  Drink 20oz electrolyte beverage up to 2 hours before scheduled surgery time. The electrolyte beverage must be completed by this time.(Gatorade, Powerade, or Pedialyte, any flavor but exclude colors red/blue/purple. Diabetic's drink same products but with zero sugar.)    No tobacco (cigarettes,  vaping, snuff, or chewing tobacco) or medical marijuana (all types) 8 hours prior to scheduled arrival time.  No Alcohol, or recreational drugs, including marijuana, 24 hours prior to scheduled arrival time.  No Chewing gum is permitted.    MEDICATION INSTRUCTIONS:    Meds to take day of procedure with a sip of water: metoprolol, spironolactone     Meds to stop: do not take entresto am of procedure    Avoid taking:?Iron and fiber supplements for 5 days prior to your procedure.        PREP:   Procedure prep instructions will come from the GI Office. There  is a letter in your [facility] My Chart with instructions for 2 day prep.    TRANSPORTATION:  You must arrange for a responsible person, 18 or older, to drive you home and stay for 24 hours following discharge after the procedure. Public transportation may only be used in the event you still have the responsible person with you, drivers of the transportation are not responsible for your care.  If you have not arranged for a driver and responsible person to accompany you, your procedure will be cancelled.      If you use home oxygen, bring enough for travel to hospital and return trip home.     OTHER IMPORTANT INFORMATION:  Do not bring money, jewelry, or valuables with you.    Do not wear make-up, nail polish, lotion, powder, or aftershave. Do not wear any jewelry, watches, earrings, rings, or body piercings.  Piercings, including silicone, will be required to be removed in Pre-op area prior to surgery.  Remove your continuous glucose monitoring system prior to arrival as staff are required to check your glucose using facility monitors. Feel free to bring your device with you as you can reapply it after surgery, upon discharge.    Transport planner for implantable devices (Deep Brain Stimulator or Nerve Stimulator for pain/mental health/sleep apnea, etc.) as they may be required to be turned off.  No visitors under age 23 are permitted in the preparation/recovery areas. It is strongly suggested that child-care arrangements be made.   Wear glasses instead of contact lenses. If possible, bring a case for your glasses.  It may be possible to wear your hearing aid throughout the procedure. Discuss this with the Pre op nursing staff.  Wear comfortable shoes and clean clothes to the hospital as you will wear them home after the procedure.  Bring any equipment - crutches, splints, etc., that you are already using at home.  Bring any legal paperwork with you (guardianship, custody, Medical Power of Attorney, Designer, industrial/product,  etc) if applicable.  Before using the bathroom at the hospital, check with hospital staff for needed specimens.    LODGING:    The [facility] provide "homes away from home" for families of patients. If you, the patient, plan to stay, a responsible person 17 or older, is required to stay with you for 24 hours following discharge, the same as if you are going home after discharge from facility. The [facility], located across the parking lot from the hospital provides lodging and supportive services to adult patients and their families. To be eligible, guests must live 50 miles or more from the [place] area and request a referral from a hospital staff person to be placed on the waiting list. The [facility] staff and volunteers can assist you with hotel reservations, usually at a discounted rate, until a room becomes available. [phone].  For more information on local motels and a list of private homes providing rental rooms, contact Social Services at [phone].    SPIRITUAL CARE:  [facility] has chaplains available every day for your spiritual needs. Our Interfaith Prayer and Meditation Room is located at [facility] on the first floor between the [employer] and elevators. You may ask your nurse or staff member to contact the on-call chaplain anytime.    For questions regarding instructions please contact PEC at [phone]. Leave a message if no answer.      CANCEL/PROCEDURE/SURGERY:   If you must cancel your procedure, call [phone] between 8am and 4:30pm.  After 4:30pm, call the [facility] Healthline at [phone] and ask for the GI Fellow on call.

## 2023-09-16 ENCOUNTER — Other Ambulatory Visit (INDEPENDENT_AMBULATORY_CARE_PROVIDER_SITE_OTHER): Payer: Self-pay | Admitting: PHYSICIAN ASSISTANT

## 2023-09-16 ENCOUNTER — Encounter (HOSPITAL_COMMUNITY)
Admission: RE | Disposition: A | Discharge status: Home / Self Care | Payer: Self-pay | Source: Ambulatory Visit | Attending: Gastroenterology

## 2023-09-16 ENCOUNTER — Ambulatory Visit (HOSPITAL_BASED_OUTPATIENT_CLINIC_OR_DEPARTMENT_OTHER): Payer: MEDICAID | Admitting: Student in an Organized Health Care Education/Training Program

## 2023-09-16 ENCOUNTER — Inpatient Hospital Stay
Admission: RE | Admit: 2023-09-16 | Discharge: 2023-09-16 | Disposition: A | Discharge status: Home / Self Care | Payer: MEDICAID | Source: Ambulatory Visit | Attending: Gastroenterology | Admitting: Gastroenterology

## 2023-09-16 ENCOUNTER — Ambulatory Visit (HOSPITAL_COMMUNITY): Payer: MEDICAID | Admitting: Student in an Organized Health Care Education/Training Program

## 2023-09-16 ENCOUNTER — Encounter (HOSPITAL_COMMUNITY): Payer: MEDICAID | Admitting: Gastroenterology

## 2023-09-16 ENCOUNTER — Encounter (HOSPITAL_COMMUNITY): Payer: Self-pay | Admitting: Gastroenterology

## 2023-09-16 ENCOUNTER — Other Ambulatory Visit: Payer: Self-pay

## 2023-09-16 DIAGNOSIS — Z1211 Encounter for screening for malignant neoplasm of colon: Secondary | ICD-10-CM

## 2023-09-16 DIAGNOSIS — Z9989 Dependence on other enabling machines and devices: Secondary | ICD-10-CM | POA: Insufficient documentation

## 2023-09-16 DIAGNOSIS — K219 Gastro-esophageal reflux disease without esophagitis: Secondary | ICD-10-CM | POA: Insufficient documentation

## 2023-09-16 DIAGNOSIS — I110 Hypertensive heart disease with heart failure: Secondary | ICD-10-CM | POA: Insufficient documentation

## 2023-09-16 DIAGNOSIS — F17210 Nicotine dependence, cigarettes, uncomplicated: Secondary | ICD-10-CM

## 2023-09-16 DIAGNOSIS — M1990 Unspecified osteoarthritis, unspecified site: Secondary | ICD-10-CM | POA: Insufficient documentation

## 2023-09-16 DIAGNOSIS — Z7901 Long term (current) use of anticoagulants: Secondary | ICD-10-CM | POA: Insufficient documentation

## 2023-09-16 DIAGNOSIS — K635 Polyp of colon: Secondary | ICD-10-CM | POA: Insufficient documentation

## 2023-09-16 DIAGNOSIS — Z79899 Other long term (current) drug therapy: Secondary | ICD-10-CM | POA: Insufficient documentation

## 2023-09-16 DIAGNOSIS — Z860101 Personal history of adenomatous and serrated colon polyps: Secondary | ICD-10-CM

## 2023-09-16 DIAGNOSIS — F32A Depression, unspecified: Secondary | ICD-10-CM | POA: Insufficient documentation

## 2023-09-16 DIAGNOSIS — G4730 Sleep apnea, unspecified: Secondary | ICD-10-CM | POA: Insufficient documentation

## 2023-09-16 DIAGNOSIS — I509 Heart failure, unspecified: Secondary | ICD-10-CM | POA: Insufficient documentation

## 2023-09-16 DIAGNOSIS — R569 Unspecified convulsions: Secondary | ICD-10-CM | POA: Insufficient documentation

## 2023-09-16 DIAGNOSIS — K5730 Diverticulosis of large intestine without perforation or abscess without bleeding: Secondary | ICD-10-CM | POA: Insufficient documentation

## 2023-09-16 DIAGNOSIS — F419 Anxiety disorder, unspecified: Secondary | ICD-10-CM | POA: Insufficient documentation

## 2023-09-16 LAB — POC BLOOD GLUCOSE (RESULTS): GLUCOSE, POC: 77 mg/dL (ref 65–125)

## 2023-09-16 SURGERY — COLONOSCOPY
Anesthesia: Monitor Anesthesia Care | Site: Anus | Wound class: Clean Contaminated Wounds-The respiratory, GI, Genital, or urinary

## 2023-09-16 MED ORDER — IPRATROPIUM 0.5 MG-ALBUTEROL 3 MG (2.5 MG BASE)/3 ML NEBULIZATION SOLN
3.0000 mL | INHALATION_SOLUTION | Freq: Once | RESPIRATORY_TRACT | Status: DC | PRN
Start: 2023-09-16 — End: 2023-09-16

## 2023-09-16 MED ORDER — SODIUM CHLORIDE 0.9% FLUSH BAG - 250 ML
INTRAVENOUS | Status: DC | PRN
Start: 2023-09-16 — End: 2023-09-16

## 2023-09-16 MED ORDER — SODIUM CHLORIDE 0.9 % (FLUSH) INJECTION SYRINGE
2.0000 mL | INJECTION | INTRAMUSCULAR | Status: DC | PRN
Start: 2023-09-16 — End: 2023-09-16

## 2023-09-16 MED ORDER — PROCHLORPERAZINE EDISYLATE 10 MG/2 ML (5 MG/ML) INJECTION SOLUTION
5.0000 mg | Freq: Once | INTRAMUSCULAR | Status: DC | PRN
Start: 2023-09-16 — End: 2023-09-16

## 2023-09-16 MED ORDER — PROPOFOL 10 MG/ML IV BOLUS
INJECTION | Freq: Once | INTRAVENOUS | Status: DC | PRN
Start: 2023-09-16 — End: 2023-09-16
  Administered 2023-09-16: 100 mg via INTRAVENOUS

## 2023-09-16 MED ORDER — ALBUTEROL SULFATE CONCENTRATE 2.5 MG/0.5 ML SOLUTION FOR NEBULIZATION
2.5000 mg | INHALATION_SOLUTION | Freq: Once | RESPIRATORY_TRACT | Status: DC | PRN
Start: 2023-09-16 — End: 2023-09-16
  Filled 2023-09-16: qty 1

## 2023-09-16 MED ORDER — SODIUM CHLORIDE 0.9 % (FLUSH) INJECTION SYRINGE
2.0000 mL | INJECTION | Freq: Three times a day (TID) | INTRAMUSCULAR | Status: DC
Start: 2023-09-16 — End: 2023-09-16

## 2023-09-16 MED ORDER — DEXTROSE 5% IN WATER (D5W) FLUSH BAG - 250 ML
INTRAVENOUS | Status: DC | PRN
Start: 2023-09-16 — End: 2023-09-16

## 2023-09-16 MED ORDER — LACTATED RINGERS INTRAVENOUS SOLUTION
INTRAVENOUS | Status: DC
Start: 2023-09-16 — End: 2023-09-16

## 2023-09-16 MED ORDER — SODIUM CHLORIDE 0.9 % INTRAVENOUS SOLUTION
INTRAVENOUS | Status: DC | PRN
Start: 2023-09-16 — End: 2023-09-16

## 2023-09-16 MED ORDER — LIDOCAINE (PF) 100 MG/5 ML (2 %) INTRAVENOUS SYRINGE
INJECTION | Freq: Once | INTRAVENOUS | Status: DC | PRN
Start: 2023-09-16 — End: 2023-09-16
  Administered 2023-09-16: 100 mg via INTRAVENOUS

## 2023-09-16 MED ORDER — ONDANSETRON HCL (PF) 4 MG/2 ML INJECTION SOLUTION
4.0000 mg | Freq: Once | INTRAMUSCULAR | Status: DC | PRN
Start: 2023-09-16 — End: 2023-09-16

## 2023-09-16 MED ORDER — PROPOFOL 10 MG/ML INTRAVENOUS EMULSION
INTRAVENOUS | Status: DC | PRN
Start: 2023-09-16 — End: 2023-09-16
  Administered 2023-09-16: 200 ug/kg/min via INTRAVENOUS
  Administered 2023-09-16: 150 ug/kg/min via INTRAVENOUS
  Administered 2023-09-16: 0 ug/kg/min via INTRAVENOUS

## 2023-09-16 SURGICAL SUPPLY — 9 items
FORCEPS ENDOS 240CM 2.8MM RJ 4 JMB DISP (ENDOSCOPIC SUPPLIES) IMPLANT
KIT ENDOSCOPIC CLEANING COMPLIANCE ENDOKIT W/1.1OZ BOWL BLUE (ENDOSCOPIC SUPPLIES) ×1 IMPLANT
SNARE 9MM 230CM 2.4MM EXACTO COLD BRD WRE CLEAN CUT ENDOS PLC RESCT STRL LF  DISP (ENDOSCOPIC SUPPLIES) ×1 IMPLANT
SNARE MED OVAL 240CM 2.4MM SNS LOOP SHRTHRW FLXB ENDOS 2.8MM WRK CHNL PLPCTM 27MM STRL DISP (ENDOSCOPIC SUPPLIES) IMPLANT
SNARE SM OVAL 240CM 2.4MM SNS LOOP SHRTHRW FLXB ENDOS PLPCTM 13MM STRL LF  DISP (ENDOSCOPIC SUPPLIES) IMPLANT
SOL WARMING DRAPE 44IN X 44IN RECTANGULAR FRAME (DRAPE/PACKS/SHEETS/OR TOWEL) IMPLANT
SOL WARMING DRAPES RECTANGULAR FRAME EXTENDED 60IN X 44IN (DRAPE/PACKS/SHEETS/OR TOWEL) IMPLANT
TRAP SPECI REM 15CM ETRAP MAGNIFY WIND MSR GUIDE PLPCTM STRL LF  DISP (SPECIMEN COLLECTION SUPPLIES) ×1 IMPLANT
USE ITEM 43607 SNARE MED OVAL 240CM 2.4MM SNS LOOP SHRTHRW FLXB ENDOS 2.8MM WRK CHNL PLPCTM 27MM STRL DISP (ENDOSCOPIC SUPPLIES) IMPLANT

## 2023-09-16 NOTE — Consults (Deleted)
[facility]  GI Admission History and Physical      Leonard, [name]   MRN:  [id number]  Date of Birth:  [birth year]    Date of Procedure:  09/16/2023    Chief Complaint:  Surveillance    HPI: Leonard [name], Leonard [name] is a 52 y.o. year old male who presents today for Colonoscopy.   This is the patient's 2nd colonoscopy.    This procedure is being done to evaluate  surveillance  after removal of 50 mm polyp that was removed by EMR  .    The patient denies abdominal pain.    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"    CPAP (continuous positive airway pressure) dependence     Depression     Diabetes mellitus type II, non insulin dependent (CMS HCC)     pt denies    Diverticulosis of colon     then diverticulitis    GERD (gastroesophageal reflux disease)     denies    Heart failure (CMS HCC)     Hip pain     HTN (hypertension)     Low testosterone     Osteoarthritis     Ritalin use disorder, severe (CMS HCC)     Severe alcohol use disorder (CMS HCC)     Severe benzodiazepine use disorder (CMS HCC)     Sleep apnea     Tobacco abuse            Allergies   Allergen Reactions    Cat Dander Rash, Itching and Swelling       Medications Prior to Admission       Prescriptions    acetaminophen (TYLENOL) 325 mg Oral Tablet    Take 2 Tablets (650 mg total) by mouth Every 6 hours as needed for Pain    aspirin (ECOTRIN) 81 mg Oral Tablet, Delayed Release (E.C.)    Take 1 Tablet (81 mg total) by mouth Twice daily    Patient not taking:  Reported on 09/03/2023    DULoxetine (CYMBALTA DR) 30 mg Oral Capsule, Delayed Release(E.C.)    Take 1 Capsule (30 mg total) by mouth Once a day    meloxicam (MOBIC) 15 mg Oral Tablet    Take 1 Tablet (15 mg total) by mouth Once a day    metoprolol succinate (TOPROL-XL) 25 mg Oral Tablet Sustained Release 24 hr    Take 1 Tablet (25 mg total) by mouth Once a day    Patient taking differently:  Take 1 Tablet (25  mg total) by mouth Every morning    naloxone (KLOXXADO) 8 mg/actuation Nasal Spray, Non-Aerosol    0.1 mL (8 mg total) by nasal (alternating) route Every 2 minutes as needed for actual or suspected opioid overdose. Alternate nostrils if needing more than one dose. Call 911 if used.    Patient not taking:  Reported on 09/03/2023    ondansetron (ZOFRAN) 4 mg Oral Tablet    Take 1 Tablet (4 mg total) by mouth Every 6 hours as needed for Nausea/Vomiting    oxyCODONE (ROXICODONE) 5 mg Oral Tablet    Take 1-2 Tablets (5-10 mg total) by mouth Every 4 hours as needed for Pain    Patient not taking:  Reported on 07/14/2023    sacubitriL-valsartan (ENTRESTO) 49-51 mg Oral Tablet    Take 1 Tablet by mouth  Twice daily    sennosides-docusate sodium (SENOKOT-S) 8.6-50 mg Oral Tablet    Take 1 Tablet by mouth Twice daily    Sildenafil (VIAGRA) 25 mg Oral Tablet    Take 1 Tablet (25 mg total) by mouth Every 24 hours as needed (1 hour before intercourse)    Sodium Fluoride (PREVIDENT 5000 BOOSTER PLUS) 1.1 % Dental Paste    Instead of toothpaste use, brush on fluoride for 3 minutes and spit out. Do not eat/drink for 30 minutes after use.    Patient not taking:  Reported on 09/03/2023    spironolactone (ALDACTONE) 25 mg Oral Tablet    Take 1 Tablet (25 mg total) by mouth Every morning with breakfast    testosterone (ANDROGEL) 20.25 mg/1.25 gram (1.62 %) Transdermal Gel in Metered-dose Pump    Place 1 pump on the skin Once a day as directed    testosterone (ANDROGEL) 20.25 mg/1.25 gram (1.62 %) Transdermal Gel in Metered-dose Pump    Place 1 pump on the skin Once a day as directed    traZODone (DESYREL) 100 mg Oral Tablet    Take 1 Tablet (100 mg total) by mouth Every night    Patient not taking:  Reported on 09/16/2023             Past Surgical History:   Procedure Laterality Date    COLECTOMY PARTIAL / TOTAL      x3    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 The patients bowel preparation consisted of  GoLytely.     Orders Placed This Encounter    OXYGEN - NASAL CANNULA    REMOVE SALINE LOCK    INSERT & MAINTAIN PERIPHERAL IV ACCESS    PERIPHERAL IV DRESSING CHANGE    INSERT & MAINTAIN PERIPHERAL IV ACCESS    PERIPHERAL IV DRESSING CHANGE    NS flush syringe    NS flush syringe    NS 250 mL flush bag    D5W 250 mL flush bag    LR premix infusion    NS flush syringe    NS flush syringe    NS 250 mL flush bag    D5W 250 mL flush bag    LR premix infusion    ondansetron (ZOFRAN) 2 mg/mL injection    prochlorperazine (COMPAZINE) 5 mg/mL injection    AND Linked Order Group     ipratropium-albuterol 0.5 mg-3 mg(2.5 mg base)/3 mL Solution for Nebulization     albuterol (PROVENTIL) 2.5mg / 0.5 mL nebulizer solution         Phil Dopp, MD

## 2023-09-16 NOTE — Anesthesia Preprocedure Evaluation (Signed)
ANESTHESIA PRE-OP EVALUATION  Planned Procedure: COLONOSCOPY (Anus)  Review of Systems     anesthesia history negative     patient summary reviewed  nursing notes reviewed        Pulmonary   sleep apnea, CPAP, current smoker and Smoked in last 24 hours,  no COPD, no asthma and no shortness of breath   Cardiovascular    Hypertension, CHF (last EF 45%, no sx) and ACE / ARB inhibitor use ,No past MI, no CAD, no dysrhythmias, no angina, no DOE, no cardiac stents and no DVT,  Exercise Tolerance: > or = 4 METS   ,beta blocker therapy      GI/Hepatic/Renal    GERD and well controlled no liver disease and no renal insufficiency        Endo/Other    osteoarthritis, no hypothyroidism, no hyperthyroidism and nocoagulation disorder   no type 1 diabetes,  no type 2 diabetes,    Neuro/Psych/MS    seizures (prior, i/s/o withdrawal), anxiety, depression, Substance use (prior, clean 18 months) no TIA, no CVA       Cancer                        Physical Assessment      Airway       Mallampati: III    TM distance: >3 FB    Neck ROM: full  Mouth Opening: good.  Facial hair  No Beard        Dental       Dentition intact             Pulmonary    Breath sounds clear to auscultation       Cardiovascular    Rhythm: regular  Rate: Normal       Other findings              Plan  ASA 3     Planned anesthesia type: MAC                         Intravenous induction     Anesthesia issues/risks discussed are: Nerve Injuries, Dental Injuries, Eye /Visual Loss, PONV, Post-op Pain Management, Post-op Cognitive Dysfunction, Cardiac Events/MI, Post-op Agitation/Tantrum, Stroke, Intraoperative Awareness/ Recall, Blood Loss, Aspiration, Difficult Airway and Sore Throat.  Anesthetic plan and risks discussed with patient  signed consent obtained      Use of blood products discussed with patient who consented to blood products.      Patient's NPO status is appropriate for Anesthesia.           Plan discussed with CRNA.

## 2023-09-16 NOTE — Progress Notes (Signed)
Placed OR request for repeat colonoscopy in 1 year for surveillance. Schedulers will reach out.     Beckie Salts, APRN, NP-C  [facility] Medicine  Gastroenterology and Hepatology  Advanced Endoscopy   09/16/23  8:36 AM

## 2023-09-16 NOTE — H&P (Signed)
[facility]  GI Admission History and Physical      Remiel, Tinkle   MRN:  [id number]  Date of Birth:  11-05-1970    Date of Procedure:  09/16/2023    Chief Complaint: Personal history of polyps    HPI: Andrejs, Wiegers is a 52 y.o. year old male who presents today for Colonoscopy.   This is the patient's 2nd colonoscopy.    Pt with hx TVA w/ HGD s/p EMR 06/19/2023 here for surveillance colonoscopy.    This procedure is being done to evaluate Personal history of polyps.    The patient denies hematochezia or melena.    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"    CPAP (continuous positive airway pressure) dependence     Depression     Diabetes mellitus type II, non insulin dependent (CMS HCC)     pt denies    Diverticulosis of colon     then diverticulitis    GERD (gastroesophageal reflux disease)     denies    Heart failure (CMS HCC)     Hip pain     HTN (hypertension)     Low testosterone     Osteoarthritis     Ritalin use disorder, severe (CMS HCC)     Severe alcohol use disorder (CMS HCC)     Severe benzodiazepine use disorder (CMS HCC)     Sleep apnea     Tobacco abuse            Allergies   Allergen Reactions    Cat Dander Rash, Itching and Swelling       Medications Prior to Admission       Prescriptions    acetaminophen (TYLENOL) 325 mg Oral Tablet    Take 2 Tablets (650 mg total) by mouth Every 6 hours as needed for Pain    aspirin (ECOTRIN) 81 mg Oral Tablet, Delayed Release (E.C.)    Take 1 Tablet (81 mg total) by mouth Twice daily    Patient not taking:  Reported on 09/03/2023    DULoxetine (CYMBALTA DR) 30 mg Oral Capsule, Delayed Release(E.C.)    Take 1 Capsule (30 mg total) by mouth Once a day    meloxicam (MOBIC) 15 mg Oral Tablet    Take 1 Tablet (15 mg total) by mouth Once a day    metoprolol succinate (TOPROL-XL) 25 mg Oral Tablet Sustained Release 24 hr    Take 1 Tablet (25 mg  total) by mouth Once a day    Patient taking differently:  Take 1 Tablet (25 mg total) by mouth Every morning    naloxone (KLOXXADO) 8 mg/actuation Nasal Spray, Non-Aerosol    0.1 mL (8 mg total) by nasal (alternating) route Every 2 minutes as needed for actual or suspected opioid overdose. Alternate nostrils if needing more than one dose. Call 911 if used.    Patient not taking:  Reported on 09/03/2023    ondansetron (ZOFRAN) 4 mg Oral Tablet    Take 1 Tablet (4 mg total) by mouth Every 6 hours as needed for Nausea/Vomiting    oxyCODONE (ROXICODONE) 5 mg Oral Tablet    Take 1-2 Tablets (5-10 mg total) by mouth Every 4 hours as needed for Pain    Patient not taking:  Reported on 07/14/2023    sacubitriL-valsartan (ENTRESTO)  49-51 mg Oral Tablet    Take 1 Tablet by mouth Twice daily    sennosides-docusate sodium (SENOKOT-S) 8.6-50 mg Oral Tablet    Take 1 Tablet by mouth Twice daily    Sildenafil (VIAGRA) 25 mg Oral Tablet    Take 1 Tablet (25 mg total) by mouth Every 24 hours as needed (1 hour before intercourse)    Sodium Fluoride (PREVIDENT 5000 BOOSTER PLUS) 1.1 % Dental Paste    Instead of toothpaste use, brush on fluoride for 3 minutes and spit out. Do not eat/drink for 30 minutes after use.    Patient not taking:  Reported on 09/03/2023    spironolactone (ALDACTONE) 25 mg Oral Tablet    Take 1 Tablet (25 mg total) by mouth Every morning with breakfast    testosterone (ANDROGEL) 20.25 mg/1.25 gram (1.62 %) Transdermal Gel in Metered-dose Pump    Place 1 pump on the skin Once a day as directed    testosterone (ANDROGEL) 20.25 mg/1.25 gram (1.62 %) Transdermal Gel in Metered-dose Pump    Place 1 pump on the skin Once a day as directed    traZODone (DESYREL) 100 mg Oral Tablet    Take 1 Tablet (100 mg total) by mouth Every night    Patient not taking:  Reported on 09/16/2023             Past Surgical History:   Procedure Laterality Date    COLECTOMY PARTIAL / TOTAL      x3    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    Plan:  Proceed with Colonoscopy. The patients bowel preparation consisted of  GoLytely 2 day bowel prep.     Orders Placed This Encounter    REMOVE SALINE LOCK    INSERT & MAINTAIN PERIPHERAL IV ACCESS    PERIPHERAL IV DRESSING CHANGE    NS flush syringe    NS flush syringe    NS 250 mL flush bag    D5W 250 mL flush bag    LR premix infusion       Francesca Jewett, APRN, NP-C  [facility] Medicine  Gastroenterology and Hepatology  Advanced Endoscopy   09/16/23  6:38 AM

## 2023-09-16 NOTE — Anesthesia Transfer of Care (Signed)
ANESTHESIA TRANSFER OF CARE   Leonard [name] is a 52 y.o. 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"    CPAP (continuous positive airway pressure) dependence     Depression     Diabetes mellitus type II, non insulin dependent (CMS HCC)     pt denies    Diverticulosis of colon     then diverticulitis    GERD (gastroesophageal reflux disease)     denies    Heart failure (CMS HCC)     Hip pain     HTN (hypertension)     Low testosterone     Osteoarthritis     Ritalin use disorder, severe (CMS HCC)     Severe alcohol use disorder (CMS HCC)     Severe benzodiazepine use disorder (CMS HCC)     Sleep apnea     Tobacco abuse       Allergy History as of 09/16/23       CAT DANDER         Noted Status Severity Type Reaction    01/06/23 1407 Arnell Asal, Ambulatory Care Assistant 01/06/23 Active Medium Intolerance Rash, Itching, Swelling                  I completed my transfer of care / handoff to the receiving personnel during which we discussed:                                Additional Info:Pt. To area e stable. Report to RN                                     Last OR Temp: Temperature: 36 C (96.8 F)  ABG:  POTASSIUM   Date Value Ref Range Status   03/06/2023 4.6 3.5 - 5.1 mmol/L Final     KETONES   Date Value Ref Range Status   07/14/2023 Negative Negative mg/dL Final   91/47/8295 Negative Negative mg/dL Final     CALCIUM   Date Value Ref Range Status   03/06/2023 8.0 (L) 8.6 - 10.2 mg/dL Final     Comment:     Gadolinium-containing contrast can interfere with calcium measurement.       Calculated P Axis   Date Value Ref Range Status   11/13/2022 76 degrees Final     Calculated R Axis   Date Value Ref Range Status   11/13/2022  66 degrees Final     Calculated T Axis   Date Value Ref Range Status   11/13/2022 78 degrees Final     Airway:* No LDAs found *  Blood pressure (!) 123/90, pulse 71, temperature 36 C (96.8 F), resp. rate 18, height 1.829 m (6'), weight 96.5 kg (212 lb 11.9 oz), SpO2 96%.

## 2023-09-16 NOTE — Anesthesia Postprocedure Evaluation (Signed)
Anesthesia Post Op Evaluation    Patient: Leonard [name]  Procedure(s) with comments:  COLONOSCOPY - surveillance  *patient needs extended 2 day bowel prep*    Last Vitals:Temperature: 36.2 C (97.2 F) (09/16/23 0830)  Heart Rate: 63 (09/16/23 0830)  BP (Non-Invasive): (!) 116/92 (09/16/23 0830)  Respiratory Rate: 16 (09/16/23 0830)  SpO2: 99 % (09/16/23 0830)    No notable events documented.    Patient is sufficiently recovered from the effects of anesthesia to participate in the evaluation and has returned to their pre-procedure level.  Patient location during evaluation: PACU       Patient participation: complete - patient participated  Level of consciousness: awake and alert and responsive to verbal stimuli    Pain management: adequate  Airway patency: patent    Anesthetic complications: no  Cardiovascular status: acceptable  Respiratory status: acceptable  Hydration status: acceptable  Patient post-procedure temperature: Pt Normothermic   PONV Status: Absent

## 2023-09-16 NOTE — Discharge Instructions (Signed)
SURGICAL DISCHARGE INSTRUCTIONS     Dr. Thakkar, Shyam, MD  performed your COLONOSCOPY today at the [facility]    [facility]:  Monday through Friday from 6 a.m. - 7 p.m.: [phone]  Between 7 p.m. - 6 a.m., weekends and holidays:  Call [facility] at [phone] or [phone].    PLEASE SEE WRITTEN HANDOUTS AS DISCUSSED BY YOUR NURSE:      SIGNS AND SYMPTOMS OF A WOUND / INCISION INFECTION   Be sure to watch for the following:  Increase in redness or red streaks near or around the wound or incision.  Increase in pain that is intense or severe and cannot be relieved by the pain medication that your doctor has given you.  Increase in swelling that cannot be relieved by elevation of a body part, or by applying ice, if permitted.  Increase in drainage, or if yellow / green in color and smells bad. This could be on a dressing or a cast.  Increase in fever for longer than 24 hours, or an increase that is higher than 101 degrees Fahrenheit (normal body temperature is 98 degrees Fahrenheit). The incision may feel warm to the touch.    **CALL YOUR DOCTOR IF ONE OR MORE OF THESE SIGNS / SYMPTOMS SHOULD OCCUR.    ANESTHESIA INFORMATION   ANESTHESIA -- ADULT PATIENTS:  You have received intravenous sedation / general anesthesia, and you may feel drowsy and light-headed for several hours. You may even experience some forgetfulness of the procedure. DO NOT DRIVE A MOTOR VEHICLE or perform any activity requiring complete alertness or coordination until you feel fully awake in about 24-48 hours. Do not drink alcoholic beverages for at least 24 hours. Do not stay alone, you must have a responsible adult available to be with you. You may also experience a dry mouth or nausea for 24 hours. This is a normal side effect and will disappear as the effects of the medication wear off.    REMEMBER   If you experience any difficulty breathing, chest pain, bleeding that you feel is excessive, persistent  nausea or vomiting or for any other concerns:  Call your physician Dr. Thakkar at [phone] or [phone]. You may also ask to have the GI doctor on call paged. They are available to you 24 hours a day.    SPECIAL INSTRUCTIONS / COMMENTS   Instructions for Lower Endoscopy  Patient Information:  No driving until tomorrow, You may experience a small amount of blood in your stool if a biopsy has been taken , and Call in one week for biopsy results - Referring MD   REMEMBER TO:  Call your physician for any of the following symptoms:  1.) Persistent vomiting or vomiting bright red blood  2.) Fever (temperature greater than 100F)  3.) Severe abdominal pain or rigid bloated abdomen.  4.) Large amounts of bright red blood or maroon stools.      FOLLOW-UP APPOINTMENTS   Please call patient services at [phone] or [phone] to schedule a date / time of return. They are open Monday - Friday from 7:30 am - 5:00 pm.

## 2023-09-17 DIAGNOSIS — K635 Polyp of colon: Secondary | ICD-10-CM

## 2023-09-17 LAB — SURGICAL PATHOLOGY SPECIMEN

## 2023-09-18 ENCOUNTER — Encounter (INDEPENDENT_AMBULATORY_CARE_PROVIDER_SITE_OTHER): Payer: Self-pay | Admitting: PHYSICIAN ASSISTANT

## 2023-09-18 ENCOUNTER — Other Ambulatory Visit (INDEPENDENT_AMBULATORY_CARE_PROVIDER_SITE_OTHER): Payer: Self-pay | Admitting: PHYSICIAN ASSISTANT

## 2023-09-21 ENCOUNTER — Other Ambulatory Visit: Payer: Self-pay

## 2023-09-21 ENCOUNTER — Other Ambulatory Visit (HOSPITAL_BASED_OUTPATIENT_CLINIC_OR_DEPARTMENT_OTHER): Payer: Self-pay | Admitting: ORTHOPEDIC, SPORTS MEDICINE

## 2023-09-22 ENCOUNTER — Other Ambulatory Visit: Payer: Self-pay

## 2023-09-23 ENCOUNTER — Other Ambulatory Visit (HOSPITAL_BASED_OUTPATIENT_CLINIC_OR_DEPARTMENT_OTHER): Payer: Self-pay | Admitting: ORTHOPEDIC, SPORTS MEDICINE

## 2023-09-23 ENCOUNTER — Other Ambulatory Visit: Payer: Self-pay

## 2023-09-23 MED ORDER — MELOXICAM 15 MG TABLET
15.0000 mg | ORAL_TABLET | Freq: Every day | ORAL | 5 refills | Status: DC
Start: 2023-09-23 — End: 2024-03-31
  Filled 2023-09-23 – 2023-09-26 (×2): qty 30, 30d supply, fill #0
  Filled 2023-10-25: qty 30, 30d supply, fill #1
  Filled 2023-11-23: qty 30, 30d supply, fill #2
  Filled 2023-12-27: qty 30, 30d supply, fill #3
  Filled 2024-01-31: qty 30, 30d supply, fill #4
  Filled 2024-03-01: qty 30, 30d supply, fill #5

## 2023-09-26 ENCOUNTER — Other Ambulatory Visit: Payer: Self-pay

## 2023-09-30 ENCOUNTER — Other Ambulatory Visit: Payer: Self-pay

## 2023-10-14 ENCOUNTER — Other Ambulatory Visit: Payer: Self-pay

## 2023-10-14 ENCOUNTER — Encounter (INDEPENDENT_AMBULATORY_CARE_PROVIDER_SITE_OTHER): Payer: Self-pay | Admitting: Gastroenterology

## 2023-10-14 ENCOUNTER — Other Ambulatory Visit (INDEPENDENT_AMBULATORY_CARE_PROVIDER_SITE_OTHER): Payer: Self-pay | Admitting: Gastroenterology

## 2023-10-14 DIAGNOSIS — Z1211 Encounter for screening for malignant neoplasm of colon: Secondary | ICD-10-CM

## 2023-10-14 MED ORDER — PEG 3350-ELECTROLYTES 236 GRAM-22.74 GRAM-6.74 GRAM-5.86 GRAM SOLUTION
4.0000 L | Freq: Once | ORAL | 0 refills | Status: AC
Start: 2023-10-14 — End: 2023-10-14
  Filled 2023-10-14 (×2): qty 4000, 1d supply, fill #0

## 2023-10-15 ENCOUNTER — Other Ambulatory Visit: Payer: Self-pay

## 2023-10-21 ENCOUNTER — Other Ambulatory Visit: Payer: Self-pay

## 2023-10-23 ENCOUNTER — Other Ambulatory Visit: Payer: Self-pay

## 2023-10-25 ENCOUNTER — Other Ambulatory Visit: Payer: Self-pay

## 2023-11-12 ENCOUNTER — Ambulatory Visit (HOSPITAL_BASED_OUTPATIENT_CLINIC_OR_DEPARTMENT_OTHER): Payer: Self-pay | Admitting: SLEEP MEDICINE

## 2023-11-23 ENCOUNTER — Other Ambulatory Visit (INDEPENDENT_AMBULATORY_CARE_PROVIDER_SITE_OTHER): Payer: Self-pay | Admitting: Urology

## 2023-11-24 ENCOUNTER — Ambulatory Visit (INDEPENDENT_AMBULATORY_CARE_PROVIDER_SITE_OTHER): Payer: Self-pay | Admitting: Urology

## 2023-11-24 ENCOUNTER — Encounter (INDEPENDENT_AMBULATORY_CARE_PROVIDER_SITE_OTHER): Payer: Self-pay

## 2023-11-24 ENCOUNTER — Other Ambulatory Visit: Payer: Self-pay

## 2023-11-24 ENCOUNTER — Other Ambulatory Visit (INDEPENDENT_AMBULATORY_CARE_PROVIDER_SITE_OTHER): Payer: Self-pay | Admitting: Urology

## 2023-11-24 DIAGNOSIS — R7989 Other specified abnormal findings of blood chemistry: Secondary | ICD-10-CM

## 2023-11-24 DIAGNOSIS — Z79890 Hormone replacement therapy (postmenopausal): Secondary | ICD-10-CM

## 2023-11-24 MED ORDER — TESTOSTERONE 20.25 MG/1.25 GRAM PER PUMP ACT.(1.62 %) TRANSDERMAL GEL
20.2500 mg | Freq: Every day | TRANSDERMAL | 3 refills | Status: DC
Start: 2023-11-24 — End: 2024-07-19
  Filled 2023-11-24: qty 75, 1d supply, fill #0
  Filled 2023-12-13: qty 75, 30d supply, fill #0
  Filled 2024-01-31 – 2024-02-05 (×2): qty 75, 30d supply, fill #1
  Filled 2024-03-25 – 2024-03-31 (×2): qty 75, 30d supply, fill #2
  Filled 2024-04-30 – 2024-05-15 (×2): qty 75, 30d supply, fill #3

## 2023-11-24 NOTE — Nursing Note (Signed)
RE:  Received: Today  Cyril Loosen, MD  Cheral Marker, CLINICAL CARE COORDINATOR  Yes.          Previous Messages       ----- Message -----  From: Cheral Marker, CLINICAL CARE COORDINATOR  Sent: 11/24/2023   7:46 AM EST  To: Cyril Loosen, MD    Refill request Androgel 20.25/1/25g (1.62%) 1 pump per day.  Last Total T April 2024 - 331    Okay to refill?  Thanks  Bari Leib    Refill prepped and routed to Dr Lambert Mody for review/signature. RN sent pt a my chart message to obtain labs 2 weeks prior to next scheduled visit for updated total T levels.     Makya Yurko, CLINICAL CARE COORDINATOR

## 2023-11-25 ENCOUNTER — Other Ambulatory Visit: Payer: Self-pay

## 2023-11-28 ENCOUNTER — Ambulatory Visit: Payer: MEDICAID | Attending: Surgical | Admitting: Surgical

## 2023-11-28 ENCOUNTER — Other Ambulatory Visit: Payer: Self-pay

## 2023-11-28 VITALS — Temp 96.8°F | Ht 71.65 in | Wt 209.4 lb

## 2023-11-28 DIAGNOSIS — M170 Bilateral primary osteoarthritis of knee: Secondary | ICD-10-CM | POA: Insufficient documentation

## 2023-11-28 MED ORDER — LIDOCAINE HCL 10 MG/ML (1 %) INJECTION SOLUTION
4.0000 mL | INTRAMUSCULAR | Status: AC
Start: 2023-11-28 — End: ?

## 2023-11-28 MED ORDER — METHYLPREDNISOLONE ACETATE 40 MG/ML SUSPENSION FOR INJECTION
40.0000 mg | INTRAMUSCULAR | Status: AC
Start: 2023-11-28 — End: ?

## 2023-11-28 NOTE — Procedures (Signed)
JOINT REPLACEMENT, [facility] [address]  [place] [address]  Operated by [facility]  Procedure Note    Name: Leonard [name] MRN:  [id number]   Date: 11/28/2023 DOB:  [birth year] (52 y.o.)         Joint Asp/Inj    Performed by: Milton Ferguson, PA-C  Authorized by: Milton Ferguson, PA-C    Consent:     Consent obtained:  Verbal    Consent given by:  Patient    Risks discussed:  Bleeding, infection and pain    Alternatives discussed:  No treatment, alternative treatment and observation  Location:     Location:  Knee    Knee joint: bilateral.  Anesthesia (see MAR for exact dosages):     Anesthesia method:  Topical application  Procedure details:     Needle gauge:  22 G    Ultrasound guidance: no      Approach:  Medial    Steroid injected: yes      Specimen collected: no    Comments:      Injections were performed on bilateral knees using 4 ml of 1% plain Lidocaine and 40 mg/ml of Depo Medrol.    This patient was seen independently in clinic.  Milton Ferguson, PA-C 11/28/2023, 09:31        Milton Ferguson, PA-C

## 2023-12-11 ENCOUNTER — Encounter (HOSPITAL_BASED_OUTPATIENT_CLINIC_OR_DEPARTMENT_OTHER): Payer: Self-pay

## 2023-12-13 ENCOUNTER — Encounter (INDEPENDENT_AMBULATORY_CARE_PROVIDER_SITE_OTHER): Payer: Self-pay

## 2023-12-13 ENCOUNTER — Other Ambulatory Visit: Payer: Self-pay

## 2023-12-14 ENCOUNTER — Other Ambulatory Visit: Payer: Self-pay

## 2023-12-15 ENCOUNTER — Encounter (HOSPITAL_BASED_OUTPATIENT_CLINIC_OR_DEPARTMENT_OTHER): Payer: Self-pay

## 2023-12-16 ENCOUNTER — Other Ambulatory Visit: Payer: Self-pay

## 2023-12-18 ENCOUNTER — Ambulatory Visit (HOSPITAL_BASED_OUTPATIENT_CLINIC_OR_DEPARTMENT_OTHER): Payer: Self-pay

## 2023-12-18 ENCOUNTER — Ambulatory Visit: Payer: MEDICAID | Attending: Hand Surgery

## 2023-12-18 ENCOUNTER — Other Ambulatory Visit: Payer: Self-pay

## 2023-12-18 VITALS — Temp 97.5°F | Ht 71.5 in | Wt 213.0 lb

## 2023-12-18 DIAGNOSIS — G5601 Carpal tunnel syndrome, right upper limb: Secondary | ICD-10-CM | POA: Insufficient documentation

## 2023-12-18 DIAGNOSIS — M65332 Trigger finger, left middle finger: Secondary | ICD-10-CM | POA: Insufficient documentation

## 2023-12-18 DIAGNOSIS — M65342 Trigger finger, left ring finger: Secondary | ICD-10-CM | POA: Insufficient documentation

## 2023-12-18 MED ORDER — BETAMETHASONE ACETATE AND SODIUM PHOS 6 MG/ML SUSPENSION FOR INJECTION
6.0000 mg | INTRAMUSCULAR | Status: AC
Start: 2023-12-18 — End: 2023-12-18
  Administered 2023-12-18: 6 mg via INTRAMUSCULAR

## 2023-12-18 MED ORDER — LIDOCAINE HCL 10 MG/ML (1 %) INJECTION SOLUTION
1.0000 mL | INTRAMUSCULAR | Status: AC
Start: 2023-12-18 — End: 2023-12-18
  Administered 2023-12-18: 10 mg via INTRAMUSCULAR

## 2023-12-18 NOTE — Progress Notes (Signed)
[facility]  DEPARTMENT OF [place], [place] [address]         Patient: Leonard [name]  DOB: 1971-07-01  MRN: [id number]    Date: 12/18/2023        CHIEF COMPLAINT:  Right carpal tunnel syndrome and left ring finger trigger finger    DATE OF ONSET:  1 year ago    HISTORY OF PRESENT ILLNESS:  Leonard [name] is a 53 yo right hand dominant male, new patient, who presents with a chief complaint of Right carpal tunnel syndrome and left ring finger trigger finger.  Patient states he has been experiencing numbness and tingling in his right thumb, index and middle finger for the past year.  States he wakes up to numb and tingling fingers.  States the numbness and tingling is a nuisance.  States typing aggravates his symptoms.  He has worn a brace intermittently.  He also mentions that his left middle and ring finger has been catching for the past few months.  States they are not really painful.    PAST MEDICAL HISTORY/PAST SURGICAL HISTORY:  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"    CPAP (continuous positive airway pressure) dependence     Depression     Diabetes mellitus type II, non insulin dependent (CMS HCC)     pt denies    Diverticulosis of colon     then diverticulitis    GERD (gastroesophageal reflux disease)     denies    Heart failure (CMS HCC)     Hip pain     HTN (hypertension)     Low testosterone     Osteoarthritis     Ritalin use disorder, severe (CMS HCC)     Severe alcohol use disorder (CMS HCC)     Severe benzodiazepine use disorder (CMS HCC)     Sleep apnea     Tobacco abuse          Past Surgical History:   Procedure Laterality Date    COLECTOMY PARTIAL / TOTAL      x3    HX HIP REPLACEMENT Left 03/05/2023    INGUINAL HERNIA REPAIR  1990           MEDICATIONS:  has a current medication list which includes the following prescription(s): acetaminophen, aspirin, duloxetine,  meloxicam, metoprolol succinate, kloxxado, ondansetron, oxycodone, sacubitril-valsartan, sennosides-docusate sodium, sildenafil, sodium fluoride, spironolactone, testosterone, testosterone, and trazodone, and the following Facility-Administered Medications: betamethasone, lidocaine, lidocaine, lidocaine, methylprednisolone acetate, and methylprednisolone acetate.    ALLERGIES:  Allergies   Allergen Reactions    Cat Dander Rash, Itching and Swelling       SOCIAL HISTORY:  Social History     Tobacco Use    Smoking status: Every Day     Current packs/day: 0.00     Types: Cigarettes     Last attempt to quit: 01/21/2023     Years since quitting: 0.9    Smokeless tobacco: Never    Tobacco comments:     5 cigarettes a day    Substance Use Topics    Alcohol use: Not Currently     Comment: Drinking from 1 pint to 1/5 liquor per day quit 05/2022       FAMILY HISTORY:  Family Medical History:       Problem Relation (Age of Onset)  Alzheimer's/Dementia Mother    Breast Cancer Maternal Grandmother    Cancer Maternal Grandmother, Paternal Grandfather, Maternal Grandfather    Congestive Heart Failure Father    No Known Problems Brother              REVIEW OF SYSTEMS:  Other than ROS in the HPI, all other systems were negative.      PHYSICAL EXAM:  General appearance: Awake, alert, no acute distress  Vital signs: Temp 36.4 C (97.5 F)   Ht 1.816 m (5' 11.5")   Wt 96.6 kg (212 lb 15.4 oz)   BMI 29.29 kg/m   Head: normocephalic, atraumatic  EENT: EOMI, no icterus or rhinorrhea  Chest: Neck supple, Symmetric nonlabored breathing, no acute respiratory distress  Cardiac: 2+ peripheral pulses  Gastrointestinal: Non-distended abdomen  Skin: Exposed skin intact w/ no rashes or lesions   Musculoskeletal: BUE  Bilateral hands have no thenar atrophy; positive Tinel's at right wrist, positive carpal compression test at right wrist; negative Tinel's at right elbow; negative Tinel's at left wrist and elbow; negative carpal compression test  on the left side  LUE  Left middle and ring fingers have mild tenderness over the A1 pulley with active catching; no PIP joint contracture  Neurological: Sensation intact to light touch grossly over base of dorsum of thumb, as well as volar tips of IF/SF  Motor intact - can give thumbs up, cross IF/MF, and make an O w/ IF and thumb  2+ radial pulse with warm and well perfused digits        IMAGING  no images    ASSESSMENT/PLAN:   Leonard [name] is a 53 yo male with right carpal tunnel syndrome and left middle and ring finger trigger fingers.    Discussed diagnosis and treatment plan moving forward with the patient.  Discussed nonoperative and operative measures with the patient which included bracing, steroid injection, and carpal tunnel release and trigger finger release.  Patient elected to move forward with a right carpal tunnel steroid injection today and will hold off on doing any treatment for the trigger fingers at this time.  I performed the injection.  See procedure note for more details.  Patient handled the procedure well.  Patient was understanding and agreeable with the plan above. He was given the opportunity to ask questions and all questions were answered at this time.     Follow up prn    This patient was seen and evaluated in clinic today independently.     Parthenia Ames, PA-C  Department of Orthopaedic Surgery   12/18/23 at 15:13.

## 2023-12-18 NOTE — Procedures (Signed)
[place]  [address]  [place] [address]  Operated by [facility]  Procedure Note    Name: Leonard [name] MRN:  [id number]   Date: 12/18/2023 DOB:  [birth year] (53 y.o.)         Upper Ext Inj: R carpal tunnel for carpal tunnel syndrome  Indications: diagnostic and therapeutic  Details: 25 G needle    I have explained to the patient the risks of the injection: infection; bleeding; damage to adjacent nerves/vessels/tendons/bones/ligaments; skin breakdown with subcutaneous fat necrosis; tendon rupture; skin color changes. The patient understands these risks and has asked me to proceed with the injection. I also explained it is possible to have a flare reaction, which is an inflammatory reaction to the crystals in the steroid. This can cause increased pain and swelling in the area of the injection and is usually self-limiting. We recommend ice, immobilization, and NSAIDs for flare reactions.     PROCEDURE NOTE:  After obtaining verbal consent, I prepped the skin of the right wrist with chloraprep, freezy spray and alcohol pads injected a 40mL:1mL mix of lidocaine 1%/celestone 6mg /mL into the right carpal tunnel syndrome. The patient tolerated the procedure well.         Procedure, treatment alternatives, risks and benefits explained, specific risks discussed. Patient was prepped and draped in the usual sterile fashion.           Parthenia Ames, PA-C  Department of Orthopaedic Surgery   12/18/23 at 15:14.

## 2023-12-27 ENCOUNTER — Other Ambulatory Visit: Payer: Self-pay

## 2024-01-14 ENCOUNTER — Encounter (INDEPENDENT_AMBULATORY_CARE_PROVIDER_SITE_OTHER): Payer: Self-pay

## 2024-01-21 ENCOUNTER — Other Ambulatory Visit: Payer: Self-pay

## 2024-01-21 ENCOUNTER — Ambulatory Visit: Payer: MEDICAID | Attending: PHYSICIAN ASSISTANT | Admitting: Rheumatology

## 2024-01-21 DIAGNOSIS — R7989 Other specified abnormal findings of blood chemistry: Secondary | ICD-10-CM | POA: Insufficient documentation

## 2024-01-21 DIAGNOSIS — Z79890 Hormone replacement therapy (postmenopausal): Secondary | ICD-10-CM | POA: Insufficient documentation

## 2024-01-21 LAB — PSA SCREENING: PSA: 1.26 ng/mL (ref <=4.00)

## 2024-01-21 LAB — H & H
HCT: 41.5 % (ref 38.9–52.0)
HGB: 13.2 g/dL — ABNORMAL LOW (ref 13.4–17.5)

## 2024-01-25 LAB — TESTOSTERONE, TOTAL, MS: TESTOSTERONE,TOTAL,LC/MS/MS: 358 ng/dL (ref 250–1100)

## 2024-01-26 ENCOUNTER — Encounter (INDEPENDENT_AMBULATORY_CARE_PROVIDER_SITE_OTHER): Payer: Self-pay | Admitting: Urology

## 2024-01-26 ENCOUNTER — Ambulatory Visit: Payer: MEDICAID | Attending: Urology | Admitting: Urology

## 2024-01-26 ENCOUNTER — Other Ambulatory Visit: Payer: Self-pay

## 2024-01-26 VITALS — BP 112/68 | HR 74 | Temp 97.0°F | Ht 71.5 in | Wt 210.8 lb

## 2024-01-26 DIAGNOSIS — R7989 Other specified abnormal findings of blood chemistry: Secondary | ICD-10-CM | POA: Insufficient documentation

## 2024-01-26 NOTE — Progress Notes (Signed)
 S:  Returns for follow up, history of low T == on Androgel , T is 358.  Feels ok.  Has sexual function.    PSA is 1.26.    Past Medical History:   Diagnosis Date    Anxiety     Convulsions (CMS HCC)     "many years ago from withdrawal from alcohol  and drugs"    CPAP (continuous positive airway pressure) dependence     Depression     Diabetes mellitus type II, non insulin  dependent (CMS HCC)     pt denies    Diverticulosis of colon     then diverticulitis    GERD (gastroesophageal reflux disease)     denies    Heart failure (CMS HCC)     Hip pain     HTN (hypertension)     Low testosterone      Osteoarthritis     Ritalin use disorder, severe (CMS HCC)     Severe alcohol  use disorder (CMS HCC)     Severe benzodiazepine use disorder (CMS HCC)     Sleep apnea     Tobacco abuse      Current Outpatient Medications   Medication Sig    acetaminophen  (TYLENOL ) 325 mg Oral Tablet Take 2 Tablets (650 mg total) by mouth Every 6 hours as needed for Pain (Patient not taking: Reported on 01/26/2024)    aspirin  (ECOTRIN) 81 mg Oral Tablet, Delayed Release (E.C.) Take 1 Tablet (81 mg total) by mouth Twice daily (Patient not taking: Reported on 01/26/2024)    DULoxetine  (CYMBALTA  DR) 30 mg Oral Capsule, Delayed Release(E.C.) Take 1 Capsule (30 mg total) by mouth Once a day    meloxicam  (MOBIC ) 15 mg Oral Tablet Take 1 Tablet (15 mg total) by mouth Once a day    metoprolol  succinate (TOPROL -XL) 25 mg Oral Tablet Sustained Release 24 hr Take 1 Tablet (25 mg total) by mouth Once a day    naloxone  (KLOXXADO ) 8 mg/actuation Nasal Spray, Non-Aerosol 0.1 mL (8 mg total) by nasal (alternating) route Every 2 minutes as needed for actual or suspected opioid overdose. Alternate nostrils if needing more than one dose. Call 911 if used.    ondansetron  (ZOFRAN ) 4 mg Oral Tablet Take 1 Tablet (4 mg total) by mouth Every 6 hours as needed for Nausea/Vomiting    oxyCODONE  (ROXICODONE ) 5 mg Oral Tablet Take 1-2 Tablets (5-10 mg total) by mouth Every 4  hours as needed for Pain (Patient not taking: Reported on 01/26/2024)    sacubitriL -valsartan  (ENTRESTO ) 49-51 mg Oral Tablet Take 1 Tablet by mouth Twice daily    sennosides-docusate sodium  (SENOKOT-S) 8.6-50 mg Oral Tablet Take 1 Tablet by mouth Twice daily    Sildenafil  (VIAGRA ) 25 mg Oral Tablet Take 1 Tablet (25 mg total) by mouth Every 24 hours as needed (1 hour before intercourse)    Sodium Fluoride  (PREVIDENT 5000 BOOSTER PLUS) 1.1 % Dental Paste Instead of toothpaste use, brush on fluoride  for 3 minutes and spit out. Do not eat/drink for 30 minutes after use.    spironolactone  (ALDACTONE ) 25 mg Oral Tablet Take 1 Tablet (25 mg total) by mouth Every morning with breakfast    testosterone  (ANDROGEL ) 20.25 mg/1.25 gram (1.62 %) Transdermal Gel in Metered-dose Pump Place 1 pump on the skin Once a day as directed    testosterone  (ANDROGEL ) 20.25 mg/1.25 gram (1.62 %) Transdermal Gel in Metered-dose Pump Place 1 pump on the skin Once a day as directed    traZODone  (DESYREL ) 100 mg  Oral Tablet Take 1 Tablet (100 mg total) by mouth Every night     O:  Examination:  Well-developed, well-nourished male  in no apparent distress.  Abdomen is not distended.  CVA tenderness: none .  No suprapubic fullness.    A:  Low T, now improved.    P:  RTC 1 year or sooner if issues.  Continues Androgel .  Serum T at next visit.    Cheryle Corral, MD, MBA, FACS  Professor  Department of Urology    Certified in Pain Management  [employer] in Traumatic Stress

## 2024-01-31 ENCOUNTER — Other Ambulatory Visit: Payer: Self-pay

## 2024-02-03 ENCOUNTER — Ambulatory Visit (INDEPENDENT_AMBULATORY_CARE_PROVIDER_SITE_OTHER): Payer: Self-pay | Admitting: Urology

## 2024-02-03 NOTE — Nursing Note (Signed)
 Prior auth submitted for androgel  1.62% one pump per day. Await response.     Fatou Dunnigan, CLINICAL CARE COORDINATOR

## 2024-02-05 ENCOUNTER — Encounter (INDEPENDENT_AMBULATORY_CARE_PROVIDER_SITE_OTHER): Payer: Self-pay

## 2024-02-05 ENCOUNTER — Ambulatory Visit (INDEPENDENT_AMBULATORY_CARE_PROVIDER_SITE_OTHER): Payer: Self-pay | Admitting: Urology

## 2024-02-05 ENCOUNTER — Other Ambulatory Visit: Payer: Self-pay

## 2024-02-05 NOTE — Nursing Note (Signed)
 RN submitted to [facility] [facility] for androgel . Per response pt has another primary care insurance now - [employer] ID [id number] BIN [id number] PCN [id number] . RN submitted through cover my meds and received approval. RN alerted [facility] staff. Prescription now running through without difficulty. RN sent pt a my chart message to inform him of approval.     Ahana Najera, CLINICAL CARE COORDINATOR

## 2024-02-06 ENCOUNTER — Other Ambulatory Visit: Payer: Self-pay

## 2024-02-07 ENCOUNTER — Other Ambulatory Visit: Payer: Self-pay

## 2024-02-21 ENCOUNTER — Other Ambulatory Visit: Payer: Self-pay

## 2024-03-01 ENCOUNTER — Other Ambulatory Visit: Payer: Self-pay

## 2024-03-01 ENCOUNTER — Other Ambulatory Visit (HOSPITAL_COMMUNITY): Payer: Self-pay | Admitting: Student in an Organized Health Care Education/Training Program

## 2024-03-01 DIAGNOSIS — F32A Depression, unspecified: Secondary | ICD-10-CM

## 2024-03-01 NOTE — Telephone Encounter (Signed)
 Not seen by Behavioral Medicine since 06/26/2022. Patient needs to re-establish care for medication refills.

## 2024-03-02 ENCOUNTER — Other Ambulatory Visit: Payer: Self-pay

## 2024-03-05 ENCOUNTER — Other Ambulatory Visit: Payer: Self-pay

## 2024-03-05 MED ORDER — CHLORHEXIDINE GLUCONATE 0.12 % MOUTHWASH
MOUTHWASH | 0 refills | Status: DC
Start: 2024-03-05 — End: 2024-07-12
  Filled 2024-03-05: qty 473, 16d supply, fill #0

## 2024-03-15 ENCOUNTER — Ambulatory Visit (HOSPITAL_BASED_OUTPATIENT_CLINIC_OR_DEPARTMENT_OTHER): Payer: Self-pay

## 2024-03-15 DIAGNOSIS — G5601 Carpal tunnel syndrome, right upper limb: Secondary | ICD-10-CM

## 2024-03-15 NOTE — Progress Notes (Signed)
 Returned patient's phone call.  He states that all of the numbness and tingling went away after his right carpal tunnel injection but the numbness and tingling has started to return.  He is interested in moving forward with a right carpal tunnel release in the procedure room.  Discussed with him I can go ahead and place that order today and our scheduler will reach out to him in a week or 2 once his insurance has approved the surgery.  Discussed with him we recommended no heavy lifting for the 1st 2 weeks after surgery to prevent wound dehiscence.  He may drive himself the morning of the procedure and does not need to fast.  He may stay on his aspirin  for the surgery.  Patient voiced understanding and appreciation for the return call.    Antonio Baumgarten, PA-C  Department of Orthopaedic Surgery   03/15/24 at 12:33.

## 2024-03-15 NOTE — Telephone Encounter (Signed)
 Regarding: Clinical Question  ----- Message from [facility] sent at 03/15/2024 12:14 PM EDT -----  Copied From CRM #[id number].  Leonard [name] () called with a clinical question.       Leonard [name]     Pt is calling and asking if he could be put on the list for surgery for his right hand and carpel tunnel ? Asking for a call back about it     Please advise   Thank you

## 2024-03-25 ENCOUNTER — Other Ambulatory Visit: Payer: Self-pay

## 2024-03-31 ENCOUNTER — Encounter (INDEPENDENT_AMBULATORY_CARE_PROVIDER_SITE_OTHER): Payer: Self-pay

## 2024-03-31 ENCOUNTER — Other Ambulatory Visit (HOSPITAL_BASED_OUTPATIENT_CLINIC_OR_DEPARTMENT_OTHER): Payer: Self-pay | Admitting: ORTHOPEDIC, SPORTS MEDICINE

## 2024-03-31 ENCOUNTER — Other Ambulatory Visit: Payer: Self-pay

## 2024-04-03 ENCOUNTER — Other Ambulatory Visit: Payer: Self-pay

## 2024-04-03 ENCOUNTER — Other Ambulatory Visit (HOSPITAL_BASED_OUTPATIENT_CLINIC_OR_DEPARTMENT_OTHER): Payer: Self-pay | Admitting: ORTHOPEDIC, SPORTS MEDICINE

## 2024-04-05 ENCOUNTER — Other Ambulatory Visit: Payer: Self-pay

## 2024-04-05 MED ORDER — MELOXICAM 15 MG TABLET
15.0000 mg | ORAL_TABLET | Freq: Every day | ORAL | 5 refills | Status: DC
  Filled 2024-04-05: qty 30, 30d supply, fill #0
  Filled 2024-04-30: qty 30, 30d supply, fill #1
  Filled 2024-06-05: qty 30, 30d supply, fill #2
  Filled 2024-07-03: qty 30, 30d supply, fill #3
  Filled 2024-08-01: qty 30, 30d supply, fill #4
  Filled 2024-08-29: qty 30, 30d supply, fill #5

## 2024-04-06 ENCOUNTER — Other Ambulatory Visit: Payer: Self-pay

## 2024-04-11 ENCOUNTER — Other Ambulatory Visit: Payer: Self-pay

## 2024-04-12 ENCOUNTER — Ambulatory Visit: Payer: MEDICAID | Attending: Surgical | Admitting: Surgical

## 2024-04-12 VITALS — Temp 96.8°F | Ht 71.65 in | Wt 207.2 lb

## 2024-04-12 DIAGNOSIS — M170 Bilateral primary osteoarthritis of knee: Secondary | ICD-10-CM | POA: Insufficient documentation

## 2024-04-12 MED ORDER — LIDOCAINE HCL 10 MG/ML (1 %) INJECTION SOLUTION
4.0000 mL | INTRAMUSCULAR | Status: AC
Start: 2024-04-12 — End: ?

## 2024-04-12 MED ORDER — METHYLPREDNISOLONE ACETATE 40 MG/ML SUSPENSION FOR INJECTION
40.0000 mg | INTRAMUSCULAR | Status: AC
Start: 2024-04-12 — End: ?

## 2024-04-12 NOTE — Procedures (Signed)
 JOINT REPLACEMENT, [facility] [address]  [place] [address]  Operated by [facility]  Procedure Note    Name: Leonard [name] MRN:  [id number]   Date: 04/12/2024 DOB:  [birth year] (52 y.o.)         Joint Asp/Inj    Performed by: Cary Clarks, PA-C  Authorized by: Cary Clarks, PA-C    Consent:     Consent obtained:  Verbal    Consent given by:  Patient    Risks discussed:  Bleeding, infection and pain    Alternatives discussed:  No treatment, alternative treatment and observation  Location:     Location:  Knee    Knee joint: bilateral.  Anesthesia (see MAR for exact dosages):     Anesthesia method:  Topical application  Procedure details:     Needle gauge:  22 G    Ultrasound guidance: no      Approach:  Medial    Steroid injected: yes      Specimen collected: no    Comments:      Injections were performed on bilateral knees using 4 ml of 1% plain Lidocaine  and 40 mg/ml of Depo Medrol .    This patient was seen independently in clinic.  Cary Clarks, PA-C 04/12/2024, 09:45        Cary Clarks, PA-C

## 2024-04-22 ENCOUNTER — Encounter (HOSPITAL_BASED_OUTPATIENT_CLINIC_OR_DEPARTMENT_OTHER): Payer: Self-pay

## 2024-04-29 ENCOUNTER — Other Ambulatory Visit: Payer: Self-pay

## 2024-04-30 ENCOUNTER — Ambulatory Visit (HOSPITAL_BASED_OUTPATIENT_CLINIC_OR_DEPARTMENT_OTHER): Payer: MEDICAID

## 2024-04-30 ENCOUNTER — Other Ambulatory Visit: Payer: Self-pay

## 2024-04-30 ENCOUNTER — Encounter (HOSPITAL_BASED_OUTPATIENT_CLINIC_OR_DEPARTMENT_OTHER): Payer: Self-pay | Admitting: Student in an Organized Health Care Education/Training Program

## 2024-04-30 ENCOUNTER — Ambulatory Visit
Payer: MEDICAID | Attending: Student in an Organized Health Care Education/Training Program | Admitting: Student in an Organized Health Care Education/Training Program

## 2024-04-30 DIAGNOSIS — G5601 Carpal tunnel syndrome, right upper limb: Secondary | ICD-10-CM | POA: Insufficient documentation

## 2024-04-30 MED ORDER — CEPHALEXIN 500 MG CAPSULE
500.0000 mg | ORAL_CAPSULE | Freq: Three times a day (TID) | ORAL | 0 refills | Status: AC
Start: 2024-04-30 — End: 2024-05-07
  Filled 2024-04-30: qty 21, 7d supply, fill #0

## 2024-04-30 MED ORDER — HYDROCODONE 5 MG-ACETAMINOPHEN 325 MG TABLET
1.0000 | ORAL_TABLET | Freq: Four times a day (QID) | ORAL | 0 refills | Status: DC | PRN
Start: 2024-04-30 — End: 2024-07-12
  Filled 2024-04-30: qty 5, 2d supply, fill #0

## 2024-04-30 NOTE — Procedures (Signed)
 Operative Report    Patient Name:  Leonard [name]   Date of birth:  May 06, 1971   MRN:  [id number]   Date of Surgery: 04/30/24        Pre-operative diagnosis:  Right carpal tunnel syndrome    Post-operative diagnosis:  Same    Procedure(s):   Right carpal tunnel decompression    Surgeon: Prentice Crafts, M.D.    Assistant(s): Mabel Hayward, MD    Anesthesia: Wide Awake with Local (1% lidocaine  with epinephrine  1:100,000 and 8.4% sodium bicarbonate)    EBL:  2 cc    IVF: 0 cc    Medications: Local (1% lidocaine  with epinephrine  1:100,000 and 8.4% sodium bicarbonate)    Clinical History/Indication for Surgery  The patient is a 53 y.o. year old male who presents for surgical intervention of carpal tunnel syndrome.  The patient has failed non-operative treatment options.  Typical indications for surgery were reviewed and the risks of carpal tunnel release were discussed.  Risks of surgery in general were reviewed including, but not limited to, infection, persistant numbness, need for additional procedures, damage to normal structures such as nerves, blood vessels, tendons, ligaments and bones as well as medical complications such as MI, stroke, PE, DVT, and even death. Patient was given opportunity to ask questions and consider his options.  He ultimately elected to proceed with surgery.  No guarantees were given or implied.     Operative Narration  The patient was identified in the pre-operative holding area.  The surgical site was identified and marked. Informed consent was obtained.  Final questions were answered. A local block was performed using a 10/1 mixture of 1% lidocaine  with epinephrine  1:100,000 and 8.4% sodium bicarbonate.  After 15 minutes, the patient was then brought to the operating room and remained in the supine position on the stretcher with the operative extremity on a floating handle table. The arm was prepped and draped in the usual sterile fashion.  A surgical timeout was performed. When all  present in the room were in agreement we proceeded with the operation.    A longitudinal incision was made in the palm between the thenar and hypothenar eminences from just distal to the volar wrist crease to Kaplan's cardinal line.  Sharp dissection was carried down through the skin and then scissor dissection was carried down to the superficial palmar fascia.  Deep retractors were then placed and the transverse carpal ligament was identified. Once an unobstructed view of the transverse carpal ligament was obtained, the ligament was sharply incised in line with the skin incision using loupe magnification. The release of the transverse carpal ligament was carried distally until the fat surrounding the superficial palmar arch was encountered. Scissor dissection was used to release the final ligamentous bands at the distal portion of the transverse carpal ligament as well as any overlying palmar fascial bands. This resulted in a complete distal release and the median nerve was easily identified and left uninjured. I then switched sides of the table and sharply carried my release proximally to the level of the distal volar forearm fascia.  The proximal forearm fascia was then released with scissors under direct visualization. At the completion of the release, the contents of the carpal tunnel were easily identifiable, completely decompressed and without iatrogenic injury.  A combination of bipolar cautery and gentle compression was used to achieve hemostasis of any bleeding skin tissue. The wound was then copiously irrigated with normal saline. The wound was closed in layered fashion with subcutaneous  3-0 Vicryl followed by interrupted 4-0 Nylon sutures on skin.     Adaptic with bacitracin was placed over the operative site followed by a soft, sterile dressing.  The patient was then transported to the PACU in stable condition, having tolerated surgery well with no obvious complications.    Postoperative Plan  May use  operative extremity for light activities  May remove dressing in 3-5 days and replace with a band-aid  Norco PRN for breakthrough pain  PO Keflex  x 7 days  Follow-up in 10-14 days as outpatient      This operative report was prepared and signed by Prentice Crafts, MD at 04/30/24, 12:06

## 2024-04-30 NOTE — Patient Instructions (Signed)
 DISCHARGE INSTRUCTIONS    - Please keep your hand elevated above your heart at all times to help reduce swelling    - Weightbearing Status: coffee cup weight bearing    - What Does This Weightbearing Mean?: This means you may  bear <5lbs weight on the operative or injured extremity.  No lifting, pushing, or pulling. You should also bend/straighten your wrist and fingers several times throughout the day to prevent stiffness of these joints.    - Splint/Cast Instructions: Please keep dressing in place. Do not remove until 3-5 days post op. For showering, you must keep it covered (i.e. plastic bag). Your splint/cast CANNOT GET WET. If blood and/or drainage appears on dressing, then reinforce with ace wrap and/or gauze. If you experience any new-onset numbness/tingling or uncontrolled pain please try loosening your splint dress/wrap. If this does not resolve issue, contact either the [facility] [phone]), [facility] [phone]), or go to your nearest Urgent Emergency Department [facility] or [facility]).    - Dressing Instructions: You may perform your first dressing change after 3-5 days. After that, you may change dressing every day. Use regular gauze and paper tape that you can purchase at any local pharmacy. Keep the incision clean and dry. Do not soak in water  until it heals. After the incision has been dry for two consecutive days (no drainage on the dressing when you change it), then you may start showering. It's OK for soap/water  to run over the area then, but avoid direct rubbing/washing. Avoid any ponds, lakes, swimming pools, or hot tubs. If you have steri-strips in place (small, white sticky bandages across wound), do not remove. They will fall off on their own.    - Warnings: Drainage from the incision that lasts more than a week after surgery may be a sign of infection and should be reported to your  surgeon right away. Fever, shaking chills or sweats may be a sign of a surgical site infection and should be reported to your surgeon immediately. Swelling and bruising of the limb is quite common after surgery especially after discharge as you will be more active at home. Swelling that does not go away with elevation for at least an hour and swelling associated with groin, thigh or calf pain may be a sign of a blood clot, also known as DVT or deep vein thrombosis. DVT is dangerous as the clot can break free and travel to the lung.  If you have swelling that does not go away with elevation or swelling associated with pain, contact your surgeon immediately. If you notice this after hours or on weekends, go to the nearest emergency room or hospital. Smoking increases the risk of fracture non-union, surgical site infection, and blood clots.  It would be wise to cease smoking altogether or at least until the wound and fracture are healed. Chest pain or shortness of breath can be a sign of a heart attack or pulmonary embolus (a blood clot that travels to the lung).  If this occurs, call 911 immediately as this is an emergency.    - Pain Medication: The prescription pain medication will likely not be refilled for this current problem, so use it sparingly. Also, you may not operate any motorized vehicle while taking narcotic pain medication.    - For mild pain, use 2 regular-strength Tylenol  pills.  - For moderate pain, take 1 prescription pain pill.  - For more severe pain,  2 prescription pain pills.    Your prescription pain reliever may contain Tylenol .  Be sure not to take more than 4000 mg of Tylenol  daily. It is easy to avoid this if you use Regular Strength Tylenol  instead of Extra Strength Tylenol  as two Regular Strength Tylenol  is 650 mg and two Extra Strength Tylenol  are 1000 mg.    - Follow-up Appointments: call [phone] to schedule your follow-up appointment with Dr. Gregary Lean in 2 weeks.   A follow-up  appointment was ordered for you upon discharge by the doctors. If you are not contacted by your surgeon's office within two days of discharge, then please contact the orthopaedic clinic at [phone]. Also, we highly recommend that you contact your primary care doctor (family doctor) after discharge to update them on your health status.

## 2024-04-30 NOTE — Nursing Note (Signed)
 Procedure Name   Right Carpal Tunnel Release          Local Administration Time   1201     Local Used 20 mL of 9 mL 1% Lidocaine  with epinephrine  (1:100,000) and 1 mL of sodium bicarbonate 4.3%        Patient to Procedure Room     1209     Timeout was performed at:  Patient, procedure and right  site verified.   1223     Incision time   1224     Procedure completed   1249

## 2024-05-11 ENCOUNTER — Other Ambulatory Visit: Payer: Self-pay

## 2024-05-12 ENCOUNTER — Ambulatory Visit: Payer: MEDICAID | Attending: Student in an Organized Health Care Education/Training Program

## 2024-05-12 DIAGNOSIS — Z4789 Encounter for other orthopedic aftercare: Secondary | ICD-10-CM

## 2024-05-12 DIAGNOSIS — Z9889 Other specified postprocedural states: Secondary | ICD-10-CM

## 2024-05-12 DIAGNOSIS — G5601 Carpal tunnel syndrome, right upper limb: Secondary | ICD-10-CM | POA: Insufficient documentation

## 2024-05-12 NOTE — Progress Notes (Signed)
 [place]  [address]  [place] [address]  Operated by [facility]  DEPARTMENT OF ORTHOPAEDICS     Clinic Follow-Up Note    Patient: Leonard [name]  DOB: [birth year]  MRN: [id number]    Date: 05/12/2024    DIAGNOSIS:  Assessment/Plan   1. Carpal tunnel syndrome, right         DATE OF SURGERY:   April 30, 2024 status post right carpal tunnel release    HISTORY OF PRESENT ILLNESS:  Leonard [name] was seen today for post op visit from the above surgery.  Patient is doing well.  States the numbness and tingling is not as bad as it was prior to surgery.  He is not experiencing any pain.  He has been applying antibiotic ointment to the wound and applying a Band-Aid.    Allergies[1]         PHYSICAL EXAM:  RIGHT UPPER EXTREMITY  SKIN: The skin over the wrist shows a well-healing surgical incision with no redness or drainage; sutures intact   SWELLING: There is no focal swelling  TENDERNESS: There is no tenderness to palpation over the incision.  ROM: Active ROM of the hand demonstrates intact flexion and extension, able to oppose thumb to small finger.  LIGAMENTS:  No ligamentous exam performed today  MUSCLE:  Intact FPL, EPL, and interosseous muscles  SENSATION: Sensation intact to light touch in axillary, median, radial and ulnar nerve distributions.  VASCULAR: Palpable radial pulse       IMAGING:  No images    ASSESSMENT:  Leonard [name] is a 53 year old male 2 weeks status post right carpal tunnel release    PLAN:  Discussed treatment plan moving forward with the patient.  Patient is doing well postoperatively.  His surgical wound is healing well with no signs of infection.  Sutures were removed today in clinic.  Encouraged him to discontinue applying any ointments to the wound.  Discussed with him it is a good sign that he has already noticed improvement in his numbness and tingling and this should continue to improve with time.  He may gradually return to  activities as tolerated.  Encouraged him to please reach out if he has any issues before his next appointment.    The patient was given the opportunity to ask questions. All of their questions were answered to their satisfaction. The patient is in agreement with our current treatment plan.     The patient will follow-up in 4 weeks with Dr.Wroblewski    Patient was seen and evaluated in clinic today independently    Eliazar Maxon, PA-C  Department of Orthopaedic Surgery   05/12/24 at 15:28.              [1]   Allergies  Allergen Reactions    Cat Dander Rash, Itching and Swelling

## 2024-05-15 ENCOUNTER — Other Ambulatory Visit: Payer: Self-pay

## 2024-05-15 ENCOUNTER — Other Ambulatory Visit (HOSPITAL_BASED_OUTPATIENT_CLINIC_OR_DEPARTMENT_OTHER): Payer: Self-pay

## 2024-05-16 ENCOUNTER — Other Ambulatory Visit: Payer: Self-pay

## 2024-05-17 ENCOUNTER — Other Ambulatory Visit: Payer: Self-pay

## 2024-05-18 ENCOUNTER — Other Ambulatory Visit: Payer: Self-pay

## 2024-05-18 MED FILL — sacubitriL 49 mg-valsartan 51 mg tablet: ORAL | 90 days supply | Qty: 180 | Fill #0 | Status: AC

## 2024-05-28 ENCOUNTER — Other Ambulatory Visit (HOSPITAL_BASED_OUTPATIENT_CLINIC_OR_DEPARTMENT_OTHER): Payer: Self-pay

## 2024-05-29 ENCOUNTER — Other Ambulatory Visit: Payer: Self-pay

## 2024-05-30 ENCOUNTER — Other Ambulatory Visit: Payer: Self-pay

## 2024-05-31 ENCOUNTER — Other Ambulatory Visit: Payer: Self-pay

## 2024-06-01 ENCOUNTER — Encounter (INDEPENDENT_AMBULATORY_CARE_PROVIDER_SITE_OTHER): Payer: Self-pay

## 2024-06-05 ENCOUNTER — Other Ambulatory Visit: Payer: Self-pay

## 2024-06-05 ENCOUNTER — Other Ambulatory Visit (HOSPITAL_BASED_OUTPATIENT_CLINIC_OR_DEPARTMENT_OTHER): Payer: Self-pay

## 2024-06-06 ENCOUNTER — Other Ambulatory Visit: Payer: Self-pay

## 2024-06-07 ENCOUNTER — Other Ambulatory Visit (HOSPITAL_BASED_OUTPATIENT_CLINIC_OR_DEPARTMENT_OTHER): Payer: Self-pay

## 2024-06-07 ENCOUNTER — Other Ambulatory Visit: Payer: Self-pay

## 2024-06-09 ENCOUNTER — Ambulatory Visit (HOSPITAL_BASED_OUTPATIENT_CLINIC_OR_DEPARTMENT_OTHER): Payer: Self-pay | Admitting: Student in an Organized Health Care Education/Training Program

## 2024-06-15 ENCOUNTER — Other Ambulatory Visit: Payer: Self-pay

## 2024-06-15 ENCOUNTER — Other Ambulatory Visit (HOSPITAL_COMMUNITY): Payer: Self-pay | Admitting: Student in an Organized Health Care Education/Training Program

## 2024-06-15 ENCOUNTER — Other Ambulatory Visit (HOSPITAL_BASED_OUTPATIENT_CLINIC_OR_DEPARTMENT_OTHER): Payer: Self-pay

## 2024-06-15 NOTE — Telephone Encounter (Signed)
 Regarding: Refill Request  ----- Message from Angeline ORN sent at 06/15/2024  1:36 PM EDT -----  Copied From CRM [id number].Duwaine Cough (Self) called to request a prescription refill.        Disp Refills Start End   spironolactone  (ALDACTONE ) 25 mg Oral Tablet 90 Tablet 3 05/28/2023 -   Sig - Route: Take 1 Tablet (25 mg total) by mouth Every morning with breakfast - Oral   Sent to pharmacy as: spironolactone  25 mg tablet (ALDACTONE )   Class: E-Rx     Preferred Pharmacy     [facility]    [address] [place] [address]    Phone: [phone] Fax: [phone]    Hours: Monday-Friday 8AM-8PM, Saturday & Sunday 8AM-6PM

## 2024-06-15 NOTE — Nursing Note (Signed)
 I attempted to order a refill of aldactone  but it is pending with the PCP who actually has been the one refilling for the patient   I called the patient and explained that all his meds are being refilled by his PCP but we talked about when you have medications that need refilled you have to have at least yearly clinic appts to update and be checked out  He stated he understood  I told him that Dr. Keene had not  ever seen him but if the PCP would not fill he could call back and we would see if there was anything that could be done to help him  I told him maybe he could get a few day supply of aldactone  until he seen his PCP the last visit with them was 11/2022  [facility] RN

## 2024-06-16 ENCOUNTER — Other Ambulatory Visit: Payer: Self-pay

## 2024-06-25 ENCOUNTER — Other Ambulatory Visit (HOSPITAL_BASED_OUTPATIENT_CLINIC_OR_DEPARTMENT_OTHER): Payer: Self-pay

## 2024-06-25 ENCOUNTER — Other Ambulatory Visit: Payer: Self-pay

## 2024-06-28 ENCOUNTER — Other Ambulatory Visit: Payer: Self-pay

## 2024-06-28 NOTE — Telephone Encounter (Signed)
 Patient needs appointment prior to refills. Last office visit was in 11-2022.   Leonard Bollinger Red, MD

## 2024-06-29 ENCOUNTER — Other Ambulatory Visit: Payer: Self-pay

## 2024-06-30 ENCOUNTER — Other Ambulatory Visit (HOSPITAL_BASED_OUTPATIENT_CLINIC_OR_DEPARTMENT_OTHER): Payer: Self-pay

## 2024-06-30 NOTE — Addendum Note (Signed)
 Addended by: TWILLA ROCKER E on: 06/30/2024 12:43 PM     Modules accepted: Orders

## 2024-06-30 NOTE — Telephone Encounter (Signed)
 Regarding: Refill Request  ----- Message from [facility] Terrell H sent at 06/29/2024  3:37 PM EDT -----  Copied From CRM #[id number].Leonard [name] (Self) called to request a prescription refill.   spironolactone  (ALDACTONE ) 25 mg Oral Tablet     Preferred Pharmacy      [facility]    [address] [place] [address]    Phone: [phone] Fax: [phone]    Hours: Monday-Friday 8AM-8PM, Saturday & Sunday 8AM-6PM

## 2024-07-01 ENCOUNTER — Other Ambulatory Visit: Payer: Self-pay

## 2024-07-01 MED ORDER — SPIRONOLACTONE 25 MG TABLET
25.0000 mg | ORAL_TABLET | Freq: Every morning | ORAL | 0 refills | Status: DC
Start: 2024-07-01 — End: 2024-08-01
  Filled 2024-07-01: qty 30, 30d supply, fill #0

## 2024-07-01 NOTE — Addendum Note (Signed)
 Addended by: Erick Murin MASHALL on: 07/01/2024 02:00 PM     Modules accepted: Orders

## 2024-07-02 ENCOUNTER — Other Ambulatory Visit: Payer: Self-pay

## 2024-07-03 ENCOUNTER — Other Ambulatory Visit (INDEPENDENT_AMBULATORY_CARE_PROVIDER_SITE_OTHER): Payer: Self-pay | Admitting: Urology

## 2024-07-04 ENCOUNTER — Other Ambulatory Visit: Payer: Self-pay

## 2024-07-05 ENCOUNTER — Other Ambulatory Visit: Payer: Self-pay

## 2024-07-05 NOTE — Progress Notes (Signed)
 FAMILY MEDICINE, [facility]  [address]  [place] [address]  Operated by [facility]  Return Patient Visit    Name: Leonard [name] MRN:  [id number]   Date: 07/06/2024 DOB:  Jan 14, 1971 (53 y.o.)   PCP: Allean Drier, MD    No chief complaint on file.    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)  Chronic, controlled   - Initially HFrEF diagnosis during severe substance abuse episode  - follows with [facility], next appointment on 9/8  - Most recent EF 54% in 2023   - Reports previous EF of 30% while living in [place] during active alcohol  and cocaine addiction  - currently on metoprolol  succinate 25 mg daily, entresto  49-51 mg, spironolactone  25 mg daily  - plan to start Jardiance  10 mg daily  Orders:    COMPREHENSIVE METABOLIC PANEL, NON-FASTING; Future    CBC/DIFF; Future    empagliflozin  (JARDIANCE ) 10 mg Oral Tablet; Take 1 Tablet (10 mg total) by mouth Daily    Stage 3a chronic kidney disease (CKD) (CMS HCC)  BL creatinine around 1.4  Orders:    COMPREHENSIVE METABOLIC PANEL, NON-FASTING; Future    CBC/DIFF; Future    Encounter for hepatitis C screening test for low risk patient    Orders:    HEPATITIS C ANTIBODY SCREEN WITH REFLEX TO HCV PCR; Future       Healthcare Maintenance  Health Maintenance: Pending and Last Completed         Date Due Completion Date    Osteoporosis screening Never done ---    Hepatitis C screening Never done ---    Pneumococcal Vaccination, Age 7+ (1 of 2 - PCV) Never done ---    Shingles Vaccine (1 of 2) Never done ---    Diabetic A1C 04/23/2023 10/22/2022    NonMedicare Preventative Exam 06/19/2023 06/18/2022    Diabetic Kidney Health  Microalb/Cr Ratio 07/11/2023 07/10/2022    Diabetic Retinal Exam 11/12/2023 11/11/2022    Diabetic Kidney Health eGFR 03/05/2024 03/06/2023    Colonoscopy 03/15/2024 09/16/2023    Influenza Vaccine (1) Never done ---    Adult Tdap-Td (2 - Td or Tdap) 11/05/2027 11/04/2017          Past medical history, past surgical history, family history, social history reviewed. Physical examination within normal limit unless otherwise noted. Healthcare maintenance recommendations for preserving well-being were discussed.            Subjective   Leonard [name] is a 53 y.o. male who presents to clinic to re-establish care. PMH significant for T2DM, anxiety, depression, polysubstance use disorder (in remission), AUD (in remission), HFpEF. Today, he has no new concerns. Denies fevers, chills, chest pain, shortness of breath, nausea, vomiting, diarrhea      Health Maintenance:  - Colonoscopy last year with hyperplastic polyp, has repeat colonoscopy scheduled for November this year  - Vaccines:   - declines PCV, shingles, influenza vaccines at this time        Objective   There were no vitals taken for this visit.    General: Alert, In no acute distress  Eyes:  Pupils equal, round, reactive to light  HENT: Normocephalic, Atraumatic  Pulmonary: Normal respiratory effort, Clear to auscultation bilaterally  Cardiovascular: Well Perfused, RRR, No murmur appreciated  Abdominal/Gastrointestinal: Soft, Non-distended, Non-tender, Bowel sounds normoactive  Extremities: No cyanosis, No edema bilaterally  Integumentary: Warm and dry, No rash  Neurologic: Alert and oriented, CN II-XII grossly in tact, Normal speech  Psychiatric: Normal affect and behavior     No follow-ups on file.    Leonard Yagi, MD      I saw and examined the patient.  I reviewed the resident's note.  I agree with the findings and plan of care as documented in the resident's note.  Any exceptions/additions are edited/noted.    Leonard P Monteleone, MD

## 2024-07-06 ENCOUNTER — Ambulatory Visit: Payer: Self-pay | Attending: Family Medicine

## 2024-07-06 ENCOUNTER — Encounter (HOSPITAL_BASED_OUTPATIENT_CLINIC_OR_DEPARTMENT_OTHER): Payer: Self-pay

## 2024-07-06 ENCOUNTER — Other Ambulatory Visit: Payer: Self-pay

## 2024-07-06 ENCOUNTER — Ambulatory Visit (HOSPITAL_BASED_OUTPATIENT_CLINIC_OR_DEPARTMENT_OTHER)

## 2024-07-06 VITALS — BP 98/74 | HR 80 | Temp 97.0°F | Ht 71.46 in | Wt 210.3 lb

## 2024-07-06 DIAGNOSIS — E119 Type 2 diabetes mellitus without complications: Secondary | ICD-10-CM

## 2024-07-06 DIAGNOSIS — I5032 Chronic diastolic (congestive) heart failure: Secondary | ICD-10-CM | POA: Insufficient documentation

## 2024-07-06 DIAGNOSIS — Z1159 Encounter for screening for other viral diseases: Secondary | ICD-10-CM | POA: Insufficient documentation

## 2024-07-06 DIAGNOSIS — N1831 Chronic kidney disease, stage 3a: Secondary | ICD-10-CM | POA: Insufficient documentation

## 2024-07-06 DIAGNOSIS — Z7689 Persons encountering health services in other specified circumstances: Secondary | ICD-10-CM | POA: Insufficient documentation

## 2024-07-06 DIAGNOSIS — R7989 Other specified abnormal findings of blood chemistry: Secondary | ICD-10-CM | POA: Insufficient documentation

## 2024-07-06 LAB — COMPREHENSIVE METABOLIC PANEL, NON-FASTING
ALBUMIN: 4.3 g/dL (ref 3.5–5.0)
ALKALINE PHOSPHATASE: 42 U/L — ABNORMAL LOW (ref 45–115)
ALT (SGPT): 16 U/L (ref <43)
ANION GAP: 7 mmol/L (ref 4–13)
AST (SGOT): 13 U/L (ref 11–34)
BILIRUBIN TOTAL: 0.3 mg/dL (ref 0.3–1.3)
BUN/CREA RATIO: 23 — ABNORMAL HIGH (ref 6–22)
BUN: 30 mg/dL — ABNORMAL HIGH (ref 8–25)
CALCIUM: 9.1 mg/dL (ref 8.6–10.2)
CHLORIDE: 108 mmol/L (ref 96–111)
CO2 TOTAL: 25 mmol/L (ref 22–30)
CREATININE: 1.31 mg/dL (ref 0.75–1.35)
GLUCOSE: 96 mg/dL (ref 65–125)
POTASSIUM: 4.8 mmol/L (ref 3.7–5.3)
PROTEIN TOTAL: 6.8 g/dL (ref 6.0–7.9)
SODIUM: 140 mmol/L (ref 136–145)
eGFRcr - MALE: 65 mL/min/1.73mˆ2 (ref >=60)

## 2024-07-06 LAB — CBC WITH DIFF
BASOPHIL #: <0.1 x10ˆ3/uL (ref <=0.20)
BASOPHIL %: 0.7 %
EOSINOPHIL #: 0.29 x10ˆ3/uL (ref <=0.50)
EOSINOPHIL %: 3.9 %
HCT: 41 % (ref 38.9–52.0)
HGB: 13 g/dL — ABNORMAL LOW (ref 13.4–17.5)
IMMATURE GRANULOCYTE #: <0.1 x10ˆ3/uL (ref <0.10)
IMMATURE GRANULOCYTE %: 0.3 % (ref 0.0–1.0)
LYMPHOCYTE #: 2.17 x10ˆ3/uL (ref 1.00–4.80)
LYMPHOCYTE %: 29 %
MCH: 29.6 pg (ref 26.0–32.0)
MCHC: 31.7 g/dL (ref 31.0–35.5)
MCV: 93.4 fL (ref 78.0–100.0)
MONOCYTE #: 0.52 x10ˆ3/uL (ref 0.20–1.10)
MONOCYTE %: 7 %
MPV: 11.3 fL (ref 8.7–12.5)
NEUTROPHIL #: 4.42 x10ˆ3/uL (ref 1.50–7.70)
NEUTROPHIL %: 59.1 %
PLATELETS: 155 x10ˆ3/uL (ref 150–400)
RBC: 4.39 x10ˆ6/uL — ABNORMAL LOW (ref 4.50–6.10)
RDW-CV: 12.8 % (ref 11.5–15.5)
WBC: 7.5 x10ˆ3/uL (ref 3.7–11.0)

## 2024-07-06 LAB — HEPATITIS C ANTIBODY SCREEN WITH REFLEX TO HCV PCR: HCV ANTIBODY QUALITATIVE: NEGATIVE

## 2024-07-06 LAB — MICROALBUMIN/CREATININE RATIO, URINE, RANDOM
CREATININE RANDOM URINE: 154 mg/dL
MICROALBUMIN RANDOM URINE: 0.7 mg/dL
MICROALBUMIN/CREATININE RATIO RANDOM URINE: 4.5 mg/g (ref <30.0)

## 2024-07-06 MED ORDER — DAPAGLIFLOZIN PROPANEDIOL 5 MG TABLET
5.0000 mg | ORAL_TABLET | Freq: Every day | ORAL | 4 refills | Status: DC
Start: 2024-07-06 — End: 2024-07-06
  Filled 2024-07-06: qty 90, 90d supply, fill #0

## 2024-07-06 MED ORDER — EMPAGLIFLOZIN 10 MG TABLET
10.0000 mg | ORAL_TABLET | Freq: Every day | ORAL | 3 refills | Status: DC
Start: 2024-07-06 — End: 2024-07-12
  Filled 2024-07-06: qty 90, 90d supply, fill #0

## 2024-07-07 LAB — HGA1C (HEMOGLOBIN A1C WITH EST AVG GLUCOSE)
ESTIMATED AVERAGE GLUCOSE: 100 mg/dL
HEMOGLOBIN A1C: 5.1 % (ref 4.0–5.6)

## 2024-07-09 LAB — TESTOSTERONE, TOTAL, BIOAVAILABLE, AND FREE WITH SHBG, SERUM
ALBUMIN: 4.5 g/dL (ref 3.6–5.1)
SEX HORMONE BINDING GLOB.: 24 nmol/L (ref 10–50)
TESTOSTERONE, BIOAVAILABLE: 261.9 ng/dL (ref 110.0–575.0)
TESTOSTERONE, FREE: 127.4 pg/mL (ref 46.0–224.0)
TESTOSTERONE,TOTAL,LC/MS/MS: 686 ng/dL (ref 250–1100)

## 2024-07-11 ENCOUNTER — Other Ambulatory Visit: Payer: Self-pay

## 2024-07-12 ENCOUNTER — Ambulatory Visit (HOSPITAL_COMMUNITY)
Payer: Self-pay | Attending: Cardiovascular Disease | Admitting: Student in an Organized Health Care Education/Training Program

## 2024-07-12 ENCOUNTER — Encounter (HOSPITAL_COMMUNITY): Payer: Self-pay | Admitting: Student in an Organized Health Care Education/Training Program

## 2024-07-12 VITALS — BP 117/76 | HR 71 | Temp 97.1°F | Resp 16 | Ht 71.0 in | Wt 210.8 lb

## 2024-07-12 DIAGNOSIS — I5020 Unspecified systolic (congestive) heart failure: Secondary | ICD-10-CM | POA: Insufficient documentation

## 2024-07-12 NOTE — Progress Notes (Signed)
 Cardiology, [facility]  [address] [facility]  [place] [address]  [phone]    Cardiology  Follow-up Patient Clinic Note    Name: Leonard [name]   DOB: 01-14-1971  [53 y.o. male]   MRN: [id number]       Visit Date: 07/12/2024   Referring: Bernice Milton Red, MD  [address]  [place],  [place] [address]   PCP: Allean Drier, MD       Reason For Visit:  Follow up  Chief Complaint: Follow Up    Information provided by: patient      History of Present Illness    Albaro is a 53 y.o. White male who presents as a follow-up. Patient was sent over by PCP, prior history of nonischemic cardiomyopathy EF unknown but less than 40% per patient he was seen at [facility] [place]  at [facility], Dr. Elidia.    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 He also reports being clean for at least 2.5 years from any substance use. 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     He also has history of pre diabetes, is on metformin  but he is stopped taking that recently.      He is very apprehensive about medications.  He is prescribed SGLT2 inhibitor however it does not want to take it.  He would like to come off of the current medication that he is on.  He denies any chest pain, shortness of breath, orthopnea, PND, leg swelling.  Denies any palpitations or syncopal events.    History question Answer Diagnosis Date Comment      Hypertension Yes HTN (hypertension)        Stress Test Performed           Abnormal EKG           Myocardial Infarction No MI (myocardial infarction) 02/21/2023       Coronary Artery Disease No Coronary artery disease 02/21/2023       CABG           Dysrhythmias No  Dysrhythmias 02/21/2023       Valvular Disease           PAD           Atrial Fibrillation           Diabetes mellitus Yes Diabetes mellitus type II, non insulin  dependent (CMS HCC)  pt denies      Chronic Lung Disease           Heart failure Yes Heart failure        Elevated Calcium Score           Cerebrovascular Disease           Dyslipidemia           On Dialysis           History of PCI             Subjective     Cardiac Comorbidities    Heart failure  Yes   Family history of heart failure  No    Family history of sudden cardiac death  No   Pulmonary hypertension  No   ASCVD score  The 10-year ASCVD risk score (Arnett DK, et al., 2019) is: 21.6%    Values used to calculate the score:      Age: 53 years      Clincally relevant sex: Male      Is Non-Hispanic African American: No      Diabetic: Yes      Tobacco smoker: Yes      Systolic Blood Pressure: 117 mmHg      Is BP treated: No      HDL Cholesterol: 31 mg/dL      Total Cholesterol: 186 mg/dL    Chronic Kidney Disease  [place] IP Heart Failure Renal Failure Yes No If yes type: No     Allergies  Allergies   Allergen Reactions    Cat Dander Rash, Itching and Swelling       Medications    Current Outpatient Medications   Medication Sig    meloxicam  (MOBIC ) 15 mg Oral Tablet Take 1 Tablet (15 mg total) by mouth Once a day    metoprolol  succinate (TOPROL -XL) 25 mg Oral Tablet Sustained Release 24 hr Take 1 Tablet (25 mg total) by mouth Once a day    sacubitriL -valsartan  (ENTRESTO ) 49-51 mg Oral Tablet Take 1 Tablet by mouth Twice daily    Sildenafil  (VIAGRA ) 25 mg Oral Tablet Take 1 Tablet (25 mg total) by mouth Every 24 hours as needed (1 hour before intercourse)    spironolactone  (ALDACTONE ) 25 mg Oral Tablet Take 1 Tablet (25 mg total) by mouth Every morning with breakfast    testosterone  (ANDROGEL ) 20.25 mg/1.25 gram (1.62 %) Transdermal Gel in Metered-dose Pump Place 1 pump on the skin Once a day as directed    testosterone  (ANDROGEL ) 20.25 mg/1.25 gram (1.62 %)  Transdermal Gel in Metered-dose Pump Place 1 pump on the skin Once a day as directed    traZODone  (DESYREL ) 100 mg Oral Tablet Take 1 Tablet (100 mg total) by mouth Every night        Medications Discontinued During This Encounter   Medication Reason    acetaminophen  (TYLENOL ) 325 mg Oral Tablet     aspirin  (ECOTRIN) 81 mg Oral Tablet, Delayed Release (E.C.)     HYDROcodone -acetaminophen  (NORCO) 5-325 mg Oral Tablet     chlorhexidine  gluconate (PERIDEX ) 0.12 % Mucous Membrane Mouthwash     naloxone  (KLOXXADO ) 8 mg/actuation Nasal Spray, Non-Aerosol     oxyCODONE  (ROXICODONE ) 5 mg Oral Tablet     Sodium Fluoride  (PREVIDENT 5000 BOOSTER PLUS) 1.1 % Dental Paste     empagliflozin  (JARDIANCE ) 10 mg Oral Tablet          History    Social History     Socioeconomic History    Marital status: Single   Occupational History    Occupation: Astronomer   Tobacco Use    Smoking status: Every Day     Current packs/day: 0.00     Types: Cigarettes     Last attempt to quit: 01/21/2023     Years since quitting: 1.4    Smokeless tobacco: Never    Tobacco comments:     5 cigarettes a day    Vaping Use    Vaping status: Never Used   Substance and Sexual Activity    Alcohol  use: Not Currently     Comment: Drinking from 1 pint to 1/5 liquor per day quit 05/2022    Drug use: Not Currently     Frequency: 3.0 times per week  Types: Cocaine     Comment: has not used for 9 months 3 times per week. 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    Performed by Fernande Juliene Loving, MD at [facility] OR [facility]TH    COLECTOMY PARTIAL / TOTAL      x3    COLONOSCOPY N/A 09/16/2023    Performed by Tsosie Look, MD at [facility] OR ENDO    COLONOSCOPY on demand N/A 06/19/2023    Performed by Tsosie Look, MD at [facility] OR ENDO    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    Physical Exam    BP 117/76 (Site: Left Arm, Patient Position: Sitting)   Pulse 71   Temp 36.2 C (97.1 F) (Tympanic)   Resp 16   Ht 1.803 m (5' 11)   Wt 95.6 kg (210 lb 12.2 oz)   SpO2 97%   BMI 29.40 kg/m         Constitutional: AA&O X3 Well developed and well nourished in no acute distress  Eyes: Conjunctiva clear, Pupils equal, round and reactive to light  HENT: Head is normocephalic, atraumatic   Neck: Normal ROM, Supple, symmetrical  Respiratory: Effort normal, clear to auscultation bilaterally.  Cardiovascular: Regular heart rate and rhythm; S1 and S2 normal; no murmur, click, rub or gallop  Gastrointestinal: Bowel sounds normal; soft, non distended non-tender to palpation  Extremities: extremities normal, atraumatic, no cyanosis or edema  Integumentary:  Skin warm and dry  Neurologic: Grossly normal, no focal neuro deficit, normal coordination and gait  Psychiatric: normal affect and speech.       Laboratory  I have reviewed the patient's lab values and culture results. Pertinent results are below:    CBC    Lab Results   Component Value Date    WBC 7.5  07/06/2024    HGB 13.0 (L) 07/06/2024    HCT 41.0 07/06/2024    PLTCNT 155 07/06/2024    RBC 4.39 (L) 07/06/2024    MCV 93.4 07/06/2024  MCHC 31.7 07/06/2024    MCH 29.6 07/06/2024    MPV 11.3 07/06/2024        Comprehensive Metabolic Profile   Lab Results   Component Value Date    SODIUM 140 07/06/2024    POTASSIUM 4.8 07/06/2024    CHLORIDE 108 07/06/2024    CO2 25 07/06/2024    ANIONGAP 7 07/06/2024    BUN 30 (H) 07/06/2024    CREATININE 1.31 07/06/2024    GLUCOSE 96 07/06/2024      Lab Results   Component Value Date    ALBUMIN  4.3 07/06/2024    TOTALPROTEIN 6.8 07/06/2024    AST 13 07/06/2024    ALT 16 07/06/2024          Lab Results   Component Value Date    HA1C 5.1 07/06/2024    INR 1.00 10/22/2022       Lab Results   Component Value Date    TRIG 215 (H) 06/26/2022    HDLCHOL 31 (L) 06/26/2022    LDLCHOL 117 (H) 06/26/2022    CHOLESTEROL 186 06/26/2022       Diagnostics    Cardiology Diagnostics          06/26/2022    09:03 08/07/2022    14:53   EKG   ECG 12-LEAD  ECG 12-LEAD - ADD ON IN [facility] CLINICS ONLY Order Status: Arrived   Echo   TRANSTHORACIC ECHOCARDIOGRAM - ADULT TRANSTHORACIC ECHOCARDIOGRAM - ADULT Order Status: Final result        Previous ECG Results    Echocardiogram    Previous stress test results    Cath    Cardiac CT    Cardiac MRI               Assessment and Plan:    Spike is a 53 y.o. male with:     Assessment/Plan   1. HFrEF (heart failure with reduced ejection fraction) (CMS HCC)        History of heart failure with reduced ejection fraction- LVEF on 06/26/2022 transthoracic echocardiogram within normal limit  Patient is apprehensive about remaining on medications.  He would like to come off of it given decreased energy and also state financial issues to be on any additional medications or performing any additional workup including a transthoracic echocardiogram.  He is euvolemic during clinic exam today.    Explained high-risk for development of worsening LVEF if he come off of  guideline directed medical therapy.  Explained the risks and benefits.  However, patient would like to come off of some of these medication if not all of these medications.  We will obtain a transthoracic echocardiogram to establish a more accurate LVEF-previous echocardiogram did not use ultrasound enhancing agents.  If LVEF is normal, we will consider stopping Aldactone .    Continue Toprol  25 mg oral daily, Entresto  4951 mg twice daily, Aldactone  25 mg oral daily    2. Hyperlipidemia, his ASVD score is greater than 20%  20mg  Lipitor   The 10-year ASCVD risk score (Arnett DK, et al., 2019) is: 21.6%    Values used to calculate the score:      Age: 60 years      Clincally relevant sex: Male      Is Non-Hispanic African American: No      Diabetic: Yes      Tobacco smoker: Yes      Systolic Blood Pressure: 117 mmHg      Is BP treated: No  HDL Cholesterol: 31 mg/dL      Total Cholesterol: 186 mg/dL    3. History of substance use disorder- reports being clean since 2.5 years    Orders:     Orders Placed This Encounter    TRANSTHORACIC ECHOCARDIOGRAM - ADULT       We will see him back in 6 months or sooner as needed    Tonye Grays, MD; 07/12/2024; 14:58  [place]  [employer]

## 2024-07-13 ENCOUNTER — Other Ambulatory Visit: Payer: Self-pay

## 2024-07-15 ENCOUNTER — Other Ambulatory Visit: Payer: Self-pay

## 2024-07-18 ENCOUNTER — Encounter (INDEPENDENT_AMBULATORY_CARE_PROVIDER_SITE_OTHER): Payer: Self-pay

## 2024-07-19 ENCOUNTER — Other Ambulatory Visit: Payer: Self-pay

## 2024-07-19 ENCOUNTER — Other Ambulatory Visit (INDEPENDENT_AMBULATORY_CARE_PROVIDER_SITE_OTHER): Payer: Self-pay | Admitting: UROLOGY

## 2024-07-19 ENCOUNTER — Other Ambulatory Visit (INDEPENDENT_AMBULATORY_CARE_PROVIDER_SITE_OTHER): Payer: Self-pay | Admitting: Urology

## 2024-07-19 ENCOUNTER — Ambulatory Visit (INDEPENDENT_AMBULATORY_CARE_PROVIDER_SITE_OTHER): Payer: Self-pay | Admitting: UROLOGY

## 2024-07-19 MED ORDER — TESTOSTERONE 20.25 MG/1.25 GRAM PER PUMP ACT.(1.62 %) TRANSDERMAL GEL
20.2500 mg | Freq: Every day | TRANSDERMAL | 3 refills | Status: AC
  Filled 2024-07-19: qty 75, 30d supply, fill #0
  Filled 2024-08-22 – 2024-09-13 (×3): qty 75, 30d supply, fill #1
  Filled 2024-10-06 – 2024-11-09 (×2): qty 75, 30d supply, fill #2

## 2024-07-19 NOTE — Nursing Note (Signed)
 FW: Refill Request  Received: Today  Jowell Bossi, CLINICAL CARE COORDINATOR  Vicenta Hussar, MD  Cc: Latessa Tillis, CLINICAL CARE COORDINATOR  Dr Hussar Vicenta MD ,  This is a Z patient that is on Androgel . He is almost out of his androgel . Would you be willing to refill until seen by you?   I scheduled him Oct 13th 1pm as this is the next available NPV slot i could find.  Thanks  Jesselee Poth          Previous [employer] from [place] [facility] sent at 07/19/2024 12:49 PM EDT    Summary: FW: Refill Request    Copied From CRM #[id number].  Former Zaslau pt  Pt states the pharmacy sent 2 requests for a refill of testosterone  (ANDROGEL ) 20.25 mg/1.25 gram (1.62 %) Transdermal Gel in Metered-dose Pump, pt is almost out of meds    Preferred pharmacy  [facility] [[phone]]                Call History    Contact Date/Time Type Contact Phone/Fax By   07/19/2024 12:46 PM EDT Phone (Incoming) Declyn, Delsol (Self) [phone] BENNIE) Julieanne [place]     RN verbally reviewed with Dr Vicenta. He is okay with providing refills as pt is not interested in transferring care to [facility] with Dr Zaslau. Visit in October is not needed and can follow up as planned in march . RN prepped prescription and routed to Dr Vicenta. RN called pt back at this time to inform him Dr Vicenta signed off on refills. Pt did not answer. RN left detailed voicemail that prescription was sent into [facility] and to call back for March appointment with Dr Vicenta.     Mirka Barbone, CLINICAL CARE COORDINATOR

## 2024-07-20 ENCOUNTER — Other Ambulatory Visit: Payer: Self-pay

## 2024-08-01 ENCOUNTER — Other Ambulatory Visit (HOSPITAL_BASED_OUTPATIENT_CLINIC_OR_DEPARTMENT_OTHER): Payer: Self-pay

## 2024-08-01 ENCOUNTER — Other Ambulatory Visit: Payer: Self-pay

## 2024-08-03 ENCOUNTER — Other Ambulatory Visit: Payer: Self-pay

## 2024-08-03 MED ORDER — SPIRONOLACTONE 25 MG TABLET
25.0000 mg | ORAL_TABLET | Freq: Every morning | ORAL | 0 refills | Status: AC
Start: 2024-08-03 — End: ?
  Filled 2024-08-03: qty 30, 30d supply, fill #0

## 2024-08-04 ENCOUNTER — Other Ambulatory Visit: Payer: Self-pay

## 2024-08-04 ENCOUNTER — Other Ambulatory Visit (HOSPITAL_COMMUNITY): Payer: Self-pay | Admitting: Student in an Organized Health Care Education/Training Program

## 2024-08-04 MED ORDER — SPIRONOLACTONE 25 MG TABLET
25.0000 mg | ORAL_TABLET | Freq: Every morning | ORAL | 1 refills | Status: AC
  Filled 2024-08-31: qty 90, 90d supply, fill #0
  Filled 2024-11-23: qty 90, 90d supply, fill #1

## 2024-08-08 ENCOUNTER — Other Ambulatory Visit: Payer: Self-pay

## 2024-08-16 ENCOUNTER — Ambulatory Visit (INDEPENDENT_AMBULATORY_CARE_PROVIDER_SITE_OTHER): Payer: Self-pay | Admitting: UROLOGY

## 2024-08-22 ENCOUNTER — Other Ambulatory Visit (HOSPITAL_BASED_OUTPATIENT_CLINIC_OR_DEPARTMENT_OTHER): Payer: Self-pay

## 2024-08-23 ENCOUNTER — Other Ambulatory Visit: Payer: Self-pay

## 2024-08-29 ENCOUNTER — Other Ambulatory Visit (HOSPITAL_BASED_OUTPATIENT_CLINIC_OR_DEPARTMENT_OTHER): Payer: Self-pay

## 2024-08-30 ENCOUNTER — Other Ambulatory Visit: Payer: Self-pay

## 2024-08-31 ENCOUNTER — Other Ambulatory Visit: Payer: Self-pay

## 2024-08-31 MED FILL — sacubitriL 49 mg-valsartan 51 mg tablet: ORAL | 90 days supply | Qty: 180 | Fill #0 | Status: AC

## 2024-08-31 MED FILL — metoprolol succinate ER 25 mg tablet,extended release 24 hr: ORAL | 90 days supply | Qty: 90 | Fill #0 | Status: AC

## 2024-09-01 ENCOUNTER — Other Ambulatory Visit: Payer: Self-pay

## 2024-09-03 ENCOUNTER — Other Ambulatory Visit (HOSPITAL_BASED_OUTPATIENT_CLINIC_OR_DEPARTMENT_OTHER): Payer: Self-pay

## 2024-09-05 ENCOUNTER — Other Ambulatory Visit: Payer: Self-pay

## 2024-09-06 ENCOUNTER — Encounter (HOSPITAL_COMMUNITY): Payer: Self-pay

## 2024-09-07 ENCOUNTER — Encounter (HOSPITAL_COMMUNITY)

## 2024-09-08 ENCOUNTER — Encounter (HOSPITAL_COMMUNITY): Payer: Self-pay

## 2024-09-13 ENCOUNTER — Other Ambulatory Visit: Payer: Self-pay

## 2024-09-21 ENCOUNTER — Encounter (HOSPITAL_COMMUNITY): Payer: Self-pay

## 2024-09-21 ENCOUNTER — Ambulatory Visit: Admit: 2024-09-21 | Payer: Self-pay | Admitting: Gastroenterology

## 2024-09-21 SURGERY — COLONOSCOPY
Anesthesia: Monitor Anesthesia Care | Site: Anus

## 2024-09-24 ENCOUNTER — Other Ambulatory Visit: Payer: Self-pay

## 2024-10-06 ENCOUNTER — Other Ambulatory Visit: Payer: Self-pay

## 2024-10-06 ENCOUNTER — Other Ambulatory Visit (HOSPITAL_BASED_OUTPATIENT_CLINIC_OR_DEPARTMENT_OTHER): Payer: Self-pay | Admitting: ORTHOPEDIC, SPORTS MEDICINE

## 2024-10-06 MED ORDER — MELOXICAM 15 MG TABLET
15.0000 mg | ORAL_TABLET | Freq: Every day | ORAL | 5 refills | Status: AC
  Filled 2024-10-06: qty 30, 30d supply, fill #0
  Filled 2024-10-31: qty 30, 30d supply, fill #1
  Filled 2024-12-05: qty 30, 30d supply, fill #2

## 2024-10-08 ENCOUNTER — Other Ambulatory Visit: Payer: Self-pay

## 2024-10-31 ENCOUNTER — Other Ambulatory Visit: Payer: Self-pay

## 2024-11-09 ENCOUNTER — Other Ambulatory Visit: Payer: Self-pay

## 2024-11-10 ENCOUNTER — Other Ambulatory Visit: Payer: Self-pay

## 2024-11-14 ENCOUNTER — Encounter (INDEPENDENT_AMBULATORY_CARE_PROVIDER_SITE_OTHER): Payer: Self-pay

## 2024-11-23 MED FILL — metoprolol succinate ER 25 mg tablet,extended release 24 hr: ORAL | 90 days supply | Qty: 90 | Fill #1 | Status: AC

## 2024-11-23 MED FILL — sacubitriL 49 mg-valsartan 51 mg tablet: ORAL | 90 days supply | Qty: 180 | Fill #1 | Status: AC

## 2024-11-24 ENCOUNTER — Other Ambulatory Visit: Payer: Self-pay

## 2024-11-25 ENCOUNTER — Other Ambulatory Visit: Payer: Self-pay

## 2024-11-28 ENCOUNTER — Other Ambulatory Visit: Payer: Self-pay

## 2024-12-05 ENCOUNTER — Other Ambulatory Visit: Payer: Self-pay

## 2025-01-10 ENCOUNTER — Ambulatory Visit (INDEPENDENT_AMBULATORY_CARE_PROVIDER_SITE_OTHER): Payer: Self-pay | Admitting: Urology
